# Patient Record
Sex: Male | Born: 1960
Health system: Southern US, Community
[De-identification: ages and names within clinical notes are randomized; demographics above are authoritative.]

## PROBLEM LIST (undated history)

## (undated) DIAGNOSIS — F419 Anxiety disorder, unspecified: Secondary | ICD-10-CM

## (undated) DIAGNOSIS — M1990 Unspecified osteoarthritis, unspecified site: Secondary | ICD-10-CM

## (undated) DIAGNOSIS — F32A Depression, unspecified: Secondary | ICD-10-CM

## (undated) DIAGNOSIS — E785 Hyperlipidemia, unspecified: Secondary | ICD-10-CM

## (undated) DIAGNOSIS — E119 Type 2 diabetes mellitus without complications: Secondary | ICD-10-CM

## (undated) DIAGNOSIS — G43909 Migraine, unspecified, not intractable, without status migrainosus: Secondary | ICD-10-CM

## (undated) DIAGNOSIS — I10 Essential (primary) hypertension: Secondary | ICD-10-CM

## (undated) HISTORY — PX: NO PAST SURGERIES: SHX2092

## (undated) HISTORY — DX: Type 2 diabetes mellitus without complications: E11.9

## (undated) HISTORY — DX: Hyperlipidemia, unspecified: E78.5

## (undated) HISTORY — DX: Migraine, unspecified, not intractable, without status migrainosus: G43.909

---

## 2000-06-12 ENCOUNTER — Emergency Department (HOSPITAL_COMMUNITY): Admission: EM | Admit: 2000-06-12 | Discharge: 2000-06-12 | Payer: Self-pay | Admitting: Emergency Medicine

## 2000-09-27 ENCOUNTER — Emergency Department (HOSPITAL_COMMUNITY): Admission: EM | Admit: 2000-09-27 | Discharge: 2000-09-27 | Payer: Self-pay

## 2001-12-28 ENCOUNTER — Emergency Department (HOSPITAL_COMMUNITY): Admission: EM | Admit: 2001-12-28 | Discharge: 2001-12-28 | Payer: Self-pay | Admitting: Emergency Medicine

## 2001-12-28 ENCOUNTER — Encounter: Payer: Self-pay | Admitting: Emergency Medicine

## 2003-04-25 ENCOUNTER — Emergency Department (HOSPITAL_COMMUNITY): Admission: AD | Admit: 2003-04-25 | Discharge: 2003-04-25 | Payer: Self-pay | Admitting: Family Medicine

## 2003-08-02 ENCOUNTER — Emergency Department (HOSPITAL_COMMUNITY): Admission: EM | Admit: 2003-08-02 | Discharge: 2003-08-02 | Payer: Self-pay | Admitting: Family Medicine

## 2003-10-19 ENCOUNTER — Emergency Department (HOSPITAL_COMMUNITY): Admission: EM | Admit: 2003-10-19 | Discharge: 2003-10-19 | Payer: Self-pay | Admitting: Emergency Medicine

## 2006-03-16 ENCOUNTER — Emergency Department (HOSPITAL_COMMUNITY): Admission: EM | Admit: 2006-03-16 | Discharge: 2006-03-16 | Payer: Self-pay | Admitting: Emergency Medicine

## 2006-08-18 ENCOUNTER — Emergency Department (HOSPITAL_COMMUNITY): Admission: EM | Admit: 2006-08-18 | Discharge: 2006-08-18 | Payer: Self-pay | Admitting: Emergency Medicine

## 2007-01-19 ENCOUNTER — Ambulatory Visit: Payer: Self-pay | Admitting: Nurse Practitioner

## 2007-01-19 DIAGNOSIS — G43909 Migraine, unspecified, not intractable, without status migrainosus: Secondary | ICD-10-CM | POA: Insufficient documentation

## 2007-01-19 DIAGNOSIS — Z8679 Personal history of other diseases of the circulatory system: Secondary | ICD-10-CM | POA: Insufficient documentation

## 2007-01-20 ENCOUNTER — Encounter (INDEPENDENT_AMBULATORY_CARE_PROVIDER_SITE_OTHER): Payer: Self-pay | Admitting: Nurse Practitioner

## 2007-01-20 ENCOUNTER — Ambulatory Visit: Payer: Self-pay | Admitting: *Deleted

## 2007-02-15 ENCOUNTER — Telehealth (INDEPENDENT_AMBULATORY_CARE_PROVIDER_SITE_OTHER): Payer: Self-pay | Admitting: Nurse Practitioner

## 2007-04-25 ENCOUNTER — Encounter (INDEPENDENT_AMBULATORY_CARE_PROVIDER_SITE_OTHER): Payer: Self-pay | Admitting: Nurse Practitioner

## 2007-04-25 ENCOUNTER — Telehealth (INDEPENDENT_AMBULATORY_CARE_PROVIDER_SITE_OTHER): Payer: Self-pay | Admitting: Nurse Practitioner

## 2007-08-04 ENCOUNTER — Ambulatory Visit: Payer: Self-pay | Admitting: Nurse Practitioner

## 2007-08-04 DIAGNOSIS — K648 Other hemorrhoids: Secondary | ICD-10-CM | POA: Insufficient documentation

## 2007-08-04 LAB — CONVERTED CEMR LAB
ALT: 23 units/L (ref 0–53)
AST: 19 units/L (ref 0–37)
Albumin: 4.5 g/dL (ref 3.5–5.2)
Alkaline Phosphatase: 67 units/L (ref 39–117)
BUN: 19 mg/dL (ref 6–23)
Basophils Absolute: 0 10*3/uL (ref 0.0–0.1)
Basophils Relative: 0 % (ref 0–1)
Bilirubin Urine: NEGATIVE
Blood in Urine, dipstick: NEGATIVE
CO2: 25 meq/L (ref 19–32)
Calcium: 8.9 mg/dL (ref 8.4–10.5)
Chlamydia, Swab/Urine, PCR: NEGATIVE
Chloride: 106 meq/L (ref 96–112)
Creatinine, Ser: 1.28 mg/dL (ref 0.40–1.50)
Eosinophils Absolute: 0.2 10*3/uL (ref 0.0–0.7)
Eosinophils Relative: 2 % (ref 0–5)
GC Probe Amp, Urine: NEGATIVE
Glucose, Bld: 77 mg/dL (ref 70–99)
Glucose, Urine, Semiquant: NEGATIVE
HCT: 45.2 % (ref 39.0–52.0)
Hemoglobin: 15.9 g/dL (ref 13.0–17.0)
Lymphocytes Relative: 31 % (ref 12–46)
Lymphs Abs: 3.2 10*3/uL (ref 0.7–4.0)
MCHC: 35.2 g/dL (ref 30.0–36.0)
MCV: 98.9 fL (ref 78.0–100.0)
Monocytes Absolute: 0.8 10*3/uL (ref 0.1–1.0)
Monocytes Relative: 8 % (ref 3–12)
Neutro Abs: 6.3 10*3/uL (ref 1.7–7.7)
Neutrophils Relative %: 60 % (ref 43–77)
Nitrite: NEGATIVE
PSA: 2.11 ng/mL (ref 0.10–4.00)
Platelets: 254 10*3/uL (ref 150–400)
Potassium: 4.4 meq/L (ref 3.5–5.3)
Protein, U semiquant: 30
RBC: 4.57 M/uL (ref 4.22–5.81)
RDW: 12.9 % (ref 11.5–15.5)
Sodium: 141 meq/L (ref 135–145)
Specific Gravity, Urine: >=1.03
TSH: 1.14 microintl units/mL (ref 0.350–5.50)
Total Bilirubin: 0.5 mg/dL (ref 0.3–1.2)
Total Protein: 7.5 g/dL (ref 6.0–8.3)
Urobilinogen, UA: 1
WBC Urine, dipstick: NEGATIVE
WBC: 10.4 10*3/uL (ref 4.0–10.5)
pH: 6

## 2007-08-05 ENCOUNTER — Encounter (INDEPENDENT_AMBULATORY_CARE_PROVIDER_SITE_OTHER): Payer: Self-pay | Admitting: Nurse Practitioner

## 2007-08-08 ENCOUNTER — Encounter (INDEPENDENT_AMBULATORY_CARE_PROVIDER_SITE_OTHER): Payer: Self-pay | Admitting: Nurse Practitioner

## 2008-04-13 ENCOUNTER — Ambulatory Visit: Payer: Self-pay | Admitting: Nurse Practitioner

## 2008-04-13 DIAGNOSIS — M25519 Pain in unspecified shoulder: Secondary | ICD-10-CM | POA: Insufficient documentation

## 2008-04-13 DIAGNOSIS — M25569 Pain in unspecified knee: Secondary | ICD-10-CM | POA: Insufficient documentation

## 2008-04-13 LAB — CONVERTED CEMR LAB
ALT: 25 units/L (ref 0–53)
Alkaline Phosphatase: 64 units/L (ref 39–117)
Rhuematoid fact SerPl-aCnc: <20 intl units/mL (ref 0–20)
Sed Rate: 14 mm/hr (ref 0–16)
Sodium: 143 meq/L (ref 135–145)
Total Bilirubin: 0.4 mg/dL (ref 0.3–1.2)
Total Protein: 7.3 g/dL (ref 6.0–8.3)

## 2008-04-17 ENCOUNTER — Ambulatory Visit (HOSPITAL_COMMUNITY): Admission: RE | Admit: 2008-04-17 | Discharge: 2008-04-17 | Payer: Self-pay | Admitting: Nurse Practitioner

## 2008-04-17 ENCOUNTER — Encounter (INDEPENDENT_AMBULATORY_CARE_PROVIDER_SITE_OTHER): Payer: Self-pay | Admitting: Nurse Practitioner

## 2008-10-08 ENCOUNTER — Ambulatory Visit: Payer: Self-pay | Admitting: Nurse Practitioner

## 2008-10-08 LAB — CONVERTED CEMR LAB
AST: 18 units/L (ref 0–37)
Alkaline Phosphatase: 66 units/L (ref 39–117)
BUN: 17 mg/dL (ref 6–23)
Basophils Absolute: 0 10*3/uL (ref 0.0–0.1)
Basophils Relative: 0 % (ref 0–1)
Bilirubin Urine: NEGATIVE
Creatinine, Ser: 1.08 mg/dL (ref 0.40–1.50)
Eosinophils Absolute: 0.2 10*3/uL (ref 0.0–0.7)
Eosinophils Relative: 3 % (ref 0–5)
GC Probe Amp, Urine: NEGATIVE
Glucose, Urine, Semiquant: NEGATIVE
HCT: 40.7 % (ref 39.0–52.0)
HDL goal, serum: 40 mg/dL
HDL: 44 mg/dL (ref >39)
Ketones, urine, test strip: NEGATIVE
LDL Cholesterol: 54 mg/dL (ref 0–99)
LDL Goal: 130 mg/dL
Lymphocytes Relative: 30 % (ref 12–46)
MCHC: 34.6 g/dL (ref 30.0–36.0)
Microalb, Ur: 1.62 mg/dL (ref 0.00–1.89)
Platelets: 238 10*3/uL (ref 150–400)
RDW: 12.9 % (ref 11.5–15.5)
Specific Gravity, Urine: >=1.03
TSH: 2.729 microintl units/mL (ref 0.350–4.500)
Total Bilirubin: 0.8 mg/dL (ref 0.3–1.2)
Total CHOL/HDL Ratio: 2.8
Triglycerides: 123 mg/dL (ref <150)

## 2008-10-10 ENCOUNTER — Encounter (INDEPENDENT_AMBULATORY_CARE_PROVIDER_SITE_OTHER): Payer: Self-pay | Admitting: Nurse Practitioner

## 2009-07-11 ENCOUNTER — Emergency Department (HOSPITAL_COMMUNITY): Admission: EM | Admit: 2009-07-11 | Discharge: 2009-07-11 | Payer: Self-pay | Admitting: Emergency Medicine

## 2010-03-09 LAB — CONVERTED CEMR LAB
ALT: 26 units/L (ref 0–53)
AST: 15 units/L (ref 0–37)
Albumin: 4.1 g/dL (ref 3.5–5.2)
Alkaline Phosphatase: 67 units/L (ref 39–117)
BUN: 14 mg/dL (ref 6–23)
CO2: 23 meq/L (ref 19–32)
Calcium: 8.9 mg/dL (ref 8.4–10.5)
Chloride: 109 meq/L (ref 96–112)
Creatinine, Ser: 1 mg/dL (ref 0.40–1.50)
Glucose, Bld: 102 mg/dL — ABNORMAL HIGH (ref 70–99)
Potassium: 3.7 meq/L (ref 3.5–5.3)
Sodium: 143 meq/L (ref 135–145)
Total Bilirubin: 0.4 mg/dL (ref 0.3–1.2)
Total Protein: 6.9 g/dL (ref 6.0–8.3)

## 2010-11-07 ENCOUNTER — Inpatient Hospital Stay (INDEPENDENT_AMBULATORY_CARE_PROVIDER_SITE_OTHER)
Admission: RE | Admit: 2010-11-07 | Discharge: 2010-11-07 | Disposition: A | Discharge status: Home / Self Care | Payer: Self-pay | Source: Ambulatory Visit | Attending: Family Medicine | Admitting: Family Medicine

## 2010-11-07 DIAGNOSIS — M542 Cervicalgia: Secondary | ICD-10-CM

## 2011-09-09 ENCOUNTER — Emergency Department (INDEPENDENT_AMBULATORY_CARE_PROVIDER_SITE_OTHER)
Admission: EM | Admit: 2011-09-09 | Discharge: 2011-09-09 | Disposition: A | Discharge status: Home / Self Care | Payer: Self-pay | Source: Home / Self Care | Attending: Family Medicine | Admitting: Family Medicine

## 2011-09-09 ENCOUNTER — Encounter (HOSPITAL_COMMUNITY): Payer: Self-pay | Admitting: *Deleted

## 2011-09-09 DIAGNOSIS — G43909 Migraine, unspecified, not intractable, without status migrainosus: Secondary | ICD-10-CM

## 2011-09-09 HISTORY — DX: Essential (primary) hypertension: I10

## 2011-09-09 MED ORDER — SUMATRIPTAN SUCCINATE 50 MG PO TABS: ORAL_TABLET | ORAL | Status: DC

## 2011-09-09 MED ORDER — METOCLOPRAMIDE HCL 10 MG PO TABS: ORAL_TABLET | ORAL | Status: DC

## 2011-09-09 MED ORDER — PREDNISONE 20 MG PO TABS: ORAL_TABLET | ORAL | Status: AC

## 2011-09-09 MED ORDER — TRAMADOL HCL 50 MG PO TABS: 50.0000 mg | ORAL_TABLET | Freq: Four times a day (QID) | ORAL | Status: AC | PRN

## 2011-09-09 NOTE — ED Notes (Signed)
Pt  Reports  Symptoms      Of  Headaches     X  sev  Weeks      -  He  States  He  Was  Told  In  Past that  His  bp     Was  High     -  He  Takes  No bp meds  At  This  Time         -  He  Is  Awake  And  Alert       Speaking        In  Complete  sentances              Skin is  Warm  And  Dry          He  Reports  The  Headache  Not  releived  By otc    Tylenol

## 2011-09-09 NOTE — ED Provider Notes (Signed)
History     CSN: [id number]  Arrival date & time 09/09/11  1136   First MD Initiated Contact with Patient 09/09/11 1206      Chief Complaint  Patient presents with  . Headache    (Consider location/radiation/quality/duration/timing/severity/associated sxs/prior treatment) HPI Comments: 51 year old male with history of hypertension and  migraine headaches. Comes complaining of intermittent headaches almost daily during the last 2 weeks. Reports that he has been diagnosed with migraines in the past in [place] state where he had an EEG as part of his workup. At the time he had "a migraine prescription that helps" with his pain (he does not remember the name). Reports he has episode of migraine about 3 times a year lasting for about a week at a time.  Headaches are bitemporal initially then generalized; currently last about 3 hours daily mostly in the evenings and he can predict when they headache is gone a start by having eye fatigue and blurred vision that self resolves after headache improves. Symptoms associated with nausea and photophobia. Denies extremity weakness numbness or paresthesias.       Past Medical History  Diagnosis Date  . Hypertension     History reviewed. No pertinent past surgical history.  History reviewed. No pertinent family history.  History  Substance Use Topics  . Smoking status: Never Smoker   . Smokeless tobacco: Not on file  . Alcohol Use: Yes      Review of Systems  Constitutional: Negative for fever and chills.  Eyes: Positive for photophobia and visual disturbance. Negative for pain and redness.  Respiratory: Negative for cough and shortness of breath.   Cardiovascular: Negative for chest pain and leg swelling.  Gastrointestinal: Positive for nausea. Negative for vomiting and diarrhea.  Genitourinary: Negative for dysuria and hematuria.  Skin: Negative for rash.  Neurological: Positive for headaches. Negative for tremors, seizures, syncope,  speech difficulty, weakness and numbness.    Allergies  Review of patient's allergies indicates no known allergies.  Home Medications   Current Outpatient Rx  Name Route Sig Dispense Refill  . ACETAMINOPHEN 500 MG PO TABS Oral Take 500 mg by mouth every 6 (six) hours as needed.    Marland Kitchen METOCLOPRAMIDE HCL 10 MG PO TABS  1 tablet by mouth x1 for migraine pain, nausea or vomiting. 15 tablet 0  . PREDNISONE 20 MG PO TABS  2 tablets by mouth daily for 5 days 10 tablet 0  . SUMATRIPTAN SUCCINATE 50 MG PO TABS  1 tablet by mouth x1 for migraine headache can repeat in 2 hours 10 tablet 0  . TRAMADOL HCL 50 MG PO TABS Oral Take 1 tablet (50 mg total) by mouth every 6 (six) hours as needed for pain. 15 tablet 0    BP 153/85  Pulse 70  Temp 98.3 F (36.8 C) (Oral)  Resp 16  SpO2 99%  Physical Exam  Nursing note and vitals reviewed. Constitutional: He is oriented to person, place, and time. He appears well-developed and well-nourished. No distress.  HENT:  Head: Normocephalic and atraumatic.  Right Ear: External ear normal.  Left Ear: External ear normal.  Nose: Nose normal.  Mouth/Throat: Oropharynx is clear and moist. No oropharyngeal exudate.  Eyes: Conjunctivae and EOM are normal. Pupils are equal, round, and reactive to light. Right eye exhibits no discharge. Left eye exhibits no discharge. No scleral icterus.  Neck: Normal range of motion. Neck supple. No JVD present. No thyromegaly present.  Cardiovascular: Normal rate, regular rhythm and normal  heart sounds.   Pulmonary/Chest: Effort normal and breath sounds normal.  Lymphadenopathy:    He has no cervical adenopathy.  Neurological: He is alert and oriented to person, place, and time. He has normal strength and normal reflexes. No cranial nerve deficit or sensory deficit. He displays a negative Romberg sign. Coordination and gait normal.       Normal visual fields compared with mine.   Skin: He is not diaphoretic.    ED Course    Procedures (including critical care time)  Labs Reviewed - No data to display No results found.   1. Migraines       MDM  Classic symptoms of migraine headaches. Also h/o borderline hypertension. Patient not taking any medications for blood pressure. Prescribed Imitrex, tramadol, prednisone and Reglan. Recommended to avoid over-the-counter pain medications to avoid rebound headaches. Asked to have his blood pressure rechecked once he's pain resolves. Return to medical attention if worsening persistent or new symptoms.         Sharin Grave, MD 09/12/11 [id number]

## 2013-05-22 ENCOUNTER — Emergency Department (HOSPITAL_COMMUNITY): Payer: Self-pay

## 2013-05-22 ENCOUNTER — Encounter (HOSPITAL_COMMUNITY): Payer: Self-pay | Admitting: Emergency Medicine

## 2013-05-22 ENCOUNTER — Emergency Department (HOSPITAL_COMMUNITY)
Admission: EM | Admit: 2013-05-22 | Discharge: 2013-05-22 | Disposition: A | Discharge status: Home / Self Care | Payer: Self-pay | Attending: Emergency Medicine | Admitting: Emergency Medicine

## 2013-05-22 DIAGNOSIS — M25569 Pain in unspecified knee: Secondary | ICD-10-CM | POA: Insufficient documentation

## 2013-05-22 DIAGNOSIS — G8929 Other chronic pain: Secondary | ICD-10-CM | POA: Insufficient documentation

## 2013-05-22 DIAGNOSIS — I10 Essential (primary) hypertension: Secondary | ICD-10-CM | POA: Insufficient documentation

## 2013-05-22 DIAGNOSIS — R52 Pain, unspecified: Secondary | ICD-10-CM | POA: Insufficient documentation

## 2013-05-22 DIAGNOSIS — M1990 Unspecified osteoarthritis, unspecified site: Secondary | ICD-10-CM | POA: Insufficient documentation

## 2013-05-22 DIAGNOSIS — M25561 Pain in right knee: Secondary | ICD-10-CM

## 2013-05-22 MED ORDER — IBUPROFEN 800 MG PO TABS: 800.0000 mg | ORAL_TABLET | Freq: Three times a day (TID) | ORAL | Status: DC

## 2013-05-22 MED ORDER — HYDROCODONE-ACETAMINOPHEN 5-325 MG PO TABS
2.0000 | ORAL_TABLET | Freq: Once | ORAL | Status: AC
  Administered 2013-05-22: 2 via ORAL
  Filled 2013-05-22: qty 2

## 2013-05-22 MED ORDER — HYDROCODONE-ACETAMINOPHEN 5-325 MG PO TABS: 1.0000 | ORAL_TABLET | ORAL | Status: DC | PRN

## 2013-05-22 NOTE — Discharge Instructions (Signed)
Take vicodin as prescribed for severe pain.   Do not drive within four hours of taking this medication (may cause drowsiness or confusion).  Take up to 800mg  of ibuprofen three times a day for the next 3-4 days (take with food).  Ice 3 times a day for 15-20 minutes.  Elevate when possible to decrease swelling and pain.  Activity as tolerated.  Follow up with your orthopedist or the one that you have been referred to.  You may return to the ER if your pain worsens or you have any other concerns.

## 2013-05-22 NOTE — ED Notes (Signed)
Pt in c/o chronic bilateral knee pain, states he has arthritis and has intermittent problems, pain returned over the weekend, worse to right knee with swelling

## 2013-05-22 NOTE — ED Provider Notes (Signed)
CSN: [id number]     Arrival date & time 05/22/13  1524 History  This chart was scribed for non-physician practitioner, Mackie PaiKatie SchineIver, PA-C working with Glynn OctaveStephen Rancour, MD by Luisa DagoPriscilla Tutu, ED scribe. This patient was seen in room TR10C/TR10C and the patient's care was started at 5:55 PM.    Chief Complaint  Patient presents with  . Knee Pain      The history is provided by the patient. No language interpreter was used.   HPI Comments: James Robinson is a 53 y.o. male with a history of bilateral knee pain presents to the Emergency Department complaining of worsening right knee pain. Pt was diagnosed with degenerative arthritis in 2009 However, this weekend the pain began to gradually worsened in his right knee. Pt is complaining of associated swelling and sleep disturbances secondary to the pain. He reports taking OTC medication and prescribed arthritis medication with no relief. 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 However, he denies any recent injury or over working the joint.  Pt states that he was down with a cold about 1 month ago. Currently he denies any fever, chills, nausea, emesis, numbness, weakness, paraesthesia, chest pain, or SOB. Denies any history of DM.   Past Medical History  Diagnosis Date  . Hypertension    History reviewed. No pertinent past surgical history. History reviewed. No pertinent family history. History  Substance Use Topics  . Smoking status: Never Smoker   . Smokeless tobacco: Not on file  . Alcohol Use: Yes    Review of Systems  Musculoskeletal: Positive for arthralgias (bilateral knee pain).  All other systems reviewed and are negative.     Allergies  Review of patient's allergies indicates no known allergies.  Home Medications   Current Outpatient Rx  Name  Route  Sig  Dispense  Refill  . acetaminophen (TYLENOL) 500 MG tablet   Oral   Take 500 mg by mouth every 6 (six) hours as needed.         . metoCLOPramide (REGLAN) 10  MG tablet      1 tablet by mouth x1 for migraine pain, nausea or vomiting.   15 tablet   0   . SUMAtriptan (IMITREX) 50 MG tablet      1 tablet by mouth x1 for migraine headache can repeat in 2 hours   10 tablet   0    BP 142/72  Pulse 82  Temp(Src) 98.5 F (36.9 C) (Oral)  Resp 20  Wt 185 lb (83.915 kg)  SpO2 100%  Physical Exam  Nursing note and vitals reviewed. Constitutional: He is oriented to person, place, and time. He appears well-developed and well-nourished. No distress.  HENT:  Head: Normocephalic and atraumatic.  Eyes:  Normal appearance  Neck: Normal range of motion.  Pulmonary/Chest: Effort normal.  Musculoskeletal: Normal range of motion.  Right knee edematous compared to L.  No erythema or warmth.  Diffuse ttp.  Pain w/ flexion beyond ~60deg as well as with extreme extension.  No pain in knee w/ passive ROM of ankle.  2+ DP pulse and distal sensation intact.    Neurological: He is alert and oriented to person, place, and time.  Psychiatric: He has a normal mood and affect. His behavior is normal.    ED Course  Procedures (including critical care time)  DIAGNOSTIC STUDIES: Oxygen Saturation is 100% on RA, normal by my interpretation.    COORDINATION OF CARE: 6:00 PM- Will order X-Ray of right knee.  Will also order pain medication. Pt advised of plan for treatment and pt agrees. 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 Degenerative changes of the knee joint are seen most marked medially. No acute fracture or dislocation is seen. No soft tissue abnormality is noted.  IMPRESSION: No acute abnormality seen.    Electronically Signed   By: Alcide CleverMark  Lukens M.D.   On: 05/22/2013 19:08     EKG Interpretation None      MDM   Final diagnoses:  Pain in right knee   Immunocompetent 53yo M presents w/ acute on chronic, non-traumatic R knee pain.  Significant edema compared to L but no fever, no erythema/warmth, full but painful ROM, non-toxic appearance.  Will xray to assess for joint effusion.  2 vicodin ordered for pain.  6:05 PM   Xray sig for degenerative changes, particularly at medial aspect of joint.  This is where pain is most significant.  Ortho tech provided w/ knee immobilizer, I prescribed short course of vicodin and 800mg  ibuprofen and recommended ice/elevation/rest.  Referred to ortho for further management.  Return precautions, including erythema/warmth, increased edema and associated fever discussed.  I personally performed the services described in this documentation, which was scribed in my presence. The recorded information has been reviewed and is accurate.    Arie SabinaCatherine E Shon Mansouri, PA-C 05/23/13 1052

## 2013-05-22 NOTE — ED Notes (Signed)
Patient transported to X-ray 

## 2013-05-23 NOTE — ED Provider Notes (Signed)
Medical screening examination/treatment/procedure(s) were performed by non-physician practitioner and as supervising physician I was immediately available for consultation/collaboration.   EKG Interpretation None       Raza Bayless, MD 05/23/13 1056 

## 2013-06-17 ENCOUNTER — Encounter (HOSPITAL_COMMUNITY): Payer: Self-pay | Admitting: Emergency Medicine

## 2013-06-17 ENCOUNTER — Emergency Department (HOSPITAL_COMMUNITY)
Admission: EM | Admit: 2013-06-17 | Discharge: 2013-06-17 | Disposition: A | Discharge status: Home / Self Care | Payer: Self-pay | Attending: Emergency Medicine | Admitting: Emergency Medicine

## 2013-06-17 DIAGNOSIS — K047 Periapical abscess without sinus: Secondary | ICD-10-CM | POA: Insufficient documentation

## 2013-06-17 DIAGNOSIS — R252 Cramp and spasm: Secondary | ICD-10-CM | POA: Insufficient documentation

## 2013-06-17 DIAGNOSIS — R6883 Chills (without fever): Secondary | ICD-10-CM | POA: Insufficient documentation

## 2013-06-17 DIAGNOSIS — IMO0001 Reserved for inherently not codable concepts without codable children: Secondary | ICD-10-CM | POA: Insufficient documentation

## 2013-06-17 DIAGNOSIS — Z791 Long term (current) use of non-steroidal anti-inflammatories (NSAID): Secondary | ICD-10-CM | POA: Insufficient documentation

## 2013-06-17 DIAGNOSIS — I10 Essential (primary) hypertension: Secondary | ICD-10-CM | POA: Insufficient documentation

## 2013-06-17 MED ORDER — CLINDAMYCIN HCL 150 MG PO CAPS
300.0000 mg | ORAL_CAPSULE | Freq: Once | ORAL | Status: AC
  Administered 2013-06-17: 300 mg via ORAL
  Filled 2013-06-17: qty 2

## 2013-06-17 MED ORDER — CLINDAMYCIN HCL 150 MG PO CAPS: 150.0000 mg | ORAL_CAPSULE | Freq: Four times a day (QID) | ORAL | Status: DC

## 2013-06-17 MED ORDER — HYDROCODONE-ACETAMINOPHEN 5-325 MG PO TABS: 1.0000 | ORAL_TABLET | Freq: Four times a day (QID) | ORAL | Status: DC | PRN

## 2013-06-17 MED ORDER — OXYCODONE-ACETAMINOPHEN 5-325 MG PO TABS
1.0000 | ORAL_TABLET | Freq: Once | ORAL | Status: AC
  Administered 2013-06-17: 1 via ORAL
  Filled 2013-06-17: qty 1

## 2013-06-17 NOTE — ED Provider Notes (Signed)
Medical screening examination/treatment/procedure(s) were performed by non-physician practitioner and as supervising physician I was immediately available for consultation/collaboration.   EKG Interpretation None        William Alishea Beaudin, MD 06/17/13 2259 

## 2013-06-17 NOTE — Discharge Instructions (Signed)
Abscessed Tooth  An abscessed tooth is an infection around your tooth. It may be caused by holes or damage to the tooth (cavity) or a dental disease. An abscessed tooth causes mild to very bad pain in and around the tooth. See your dentist right away if you have tooth or gum pain.  HOME CARE   Take your medicine as told. Finish it even if you start to feel better.   Do not drive after taking pain medicine.   Rinse your mouth (gargle) often with salt water ( teaspoon salt in 8 ounces of warm water).   Do not apply heat to the outside of your face.  GET HELP RIGHT AWAY IF:    You have a temperature by mouth above 102 F (38.9 C), not controlled by medicine.   You have chills and a very bad headache.   You have problems breathing or swallowing.   Your mouth will not open.   You develop puffiness (swelling) on the neck or around the eye.   Your pain is not helped by medicine.   Your pain is getting worse instead of better.  MAKE SURE YOU:    Understand these instructions.   Will watch your condition.   Will get help right away if you are not doing well or get worse.  Document Released: 07/15/2007 Document Revised: 04/20/2011 Document Reviewed: 05/06/2010  [employer] Patient Information 2014 [employer]

## 2013-06-17 NOTE — ED Notes (Signed)
Patient went to get a partial and they noticed a loss tooth on the bottom left side.  Left side of his mouth is swollen, no difficulty swelling.  Also c/o his hands cramping.  Works Licensed conveyancerwaiting tables.

## 2013-06-17 NOTE — ED Provider Notes (Signed)
CSN: [id number]     Arrival date & time 06/17/13  2023 History  This chart was scribed for non-physician practitioner, Fayrene HelperBowie Asiah Browder, PA-C working with Dagmar HaitWilliam Blair Walden, MD by Greggory StallionKayla Andersen, ED scribe. This patient was seen in room TR08C/TR08C and the patient's care was started at 9:59 PM.   Chief Complaint  Patient presents with  . Oral Swelling  . Hands Cramping    The history is provided by the patient. No language interpreter was used.   HPI Comments: James Robinson is a 53 y.o. male who presents to the Emergency Department complaining of gradual onset, sharp, aching left lower dental pain and worsening swelling that started 6 days ago. States he is also having some chills. Eating worsens the pain. Pt was given Vicodin and 800 mg ibuprofen for knee swelling and pain a few weeks ago and has been taking it for his dental pain with some relief. He went to get impressions done for a partial with Dr. Bruna Pottereague about 5 days ago but states his symptoms weren't that bad. Pt is also complaining of intermittent bilateral hand cramping that started about one year ago. He states he stretches his hand muscles when it cramps with some relief. Denies fever.   Past Medical History  Diagnosis Date  . Hypertension    History reviewed. No pertinent past surgical history. History reviewed. No pertinent family history. History  Substance Use Topics  . Smoking status: Never Smoker   . Smokeless tobacco: Not on file  . Alcohol Use: Yes    Review of Systems  Constitutional: Positive for chills. Negative for fever.  HENT: Positive for dental problem and facial swelling.   Musculoskeletal: Positive for myalgias.  All other systems reviewed and are negative.  Allergies  Review of patient's allergies indicates no known allergies.  Home Medications   Prior to Admission medications   Medication Sig Start Date End Date Taking? Authorizing Provider  acetaminophen (TYLENOL) 500 MG tablet Take 500 mg by mouth  every 6 (six) hours as needed.    Historical Provider, MD  HYDROcodone-acetaminophen (NORCO/VICODIN) 5-325 MG per tablet Take 1 tablet by mouth every 4 (four) hours as needed for moderate pain. 05/22/13   Arie Sabinaatherine E Schinlever, PA-C  ibuprofen (ADVIL,MOTRIN) 200 MG tablet Take 200 mg by mouth every 6 (six) hours as needed for moderate pain.    Historical Provider, MD  ibuprofen (ADVIL,MOTRIN) 800 MG tablet Take 1 tablet (800 mg total) by mouth 3 (three) times daily. 05/22/13   Arie Sabinaatherine E Schinlever, PA-C   BP 142/77  Pulse 101  Temp(Src) 99 F (37.2 C) (Oral)  Resp 20  Ht 5\' 11"  (1.803 m)  Wt 185 lb (83.915 kg)  BMI 25.81 kg/m2  SpO2 99%  Physical Exam  Nursing note and vitals reviewed. Constitutional: He is oriented to person, place, and time. He appears well-developed and well-nourished. No distress.  HENT:  Head: Normocephalic and atraumatic.  Left lower gumline with obvious abscess. Abscess measuring 3 cm. Tender to palpation with fluctuance. Facial involvement but no airway compromise. No trismus.  Eyes: EOM are normal.  Neck: Neck supple. No tracheal deviation present.  Cardiovascular: Normal rate.   Pulmonary/Chest: Effort normal. No respiratory distress.  Musculoskeletal: Normal range of motion.  Lymphadenopathy:    He has cervical adenopathy.  Neurological: He is alert and oriented to person, place, and time.  Skin: Skin is warm and dry.  Psychiatric: He has a normal mood and affect. His behavior is normal.  ED Course  Procedures (including critical care time)  DIAGNOSTIC STUDIES: Oxygen Saturation is 99% on RA, normal by my interpretation.    COORDINATION OF CARE: 10:04 PM-Discussed treatment plan which includes I&D, updating tetanus and an antibiotic with pt at bedside and pt agreed to plan. Advised pt to follow up with his dentist.   INCISION AND DRAINAGE Performed by: Fayrene HelperBowie Lakysha Kossman, PA-C Consent: Verbal consent obtained. Risks and benefits: risks, benefits and  alternatives were discussed Type: abscess  Body area: left lower gumline  Anesthesia: local infiltration  Incision was made with a scalpel.  Local anesthetic: lidocaine 2% without epinephrine  Anesthetic total: 5 ml  Complexity: complex  Drainage: purulent  Drainage amount: moderate  Packing material: none  Patient tolerance: Patient tolerated the procedure well with no immediate complications.   Labs Review Labs Reviewed - No data to display  Imaging Review No results found.   EKG Interpretation None      MDM   Final diagnoses:  Periapical abscess with facial involvement    BP 142/77  Pulse 101  Temp(Src) 99 F (37.2 C) (Oral)  Resp 20  Ht 5\' 11"  (1.803 m)  Wt 185 lb (83.915 kg)  BMI 25.81 kg/m2  SpO2 99%   I personally performed the services described in this documentation, which was scribed in my presence. The recorded information has been reviewed and is accurate.  Fayrene HelperBowie Demontrae Gilbert, PA-C 06/17/13 2252

## 2013-06-17 NOTE — ED Notes (Signed)
Onset Monday pt went to dentist to have partial fitted.  Dentist noticed that the tooth that needs to be pulled was abscessed.  Dentist would not pull the abscess tooth at that time.  Later Monday left side of face started swelling and increase in pain.  Pain worsening.

## 2013-12-21 ENCOUNTER — Emergency Department (HOSPITAL_COMMUNITY)
Admission: EM | Admit: 2013-12-21 | Discharge: 2013-12-21 | Disposition: A | Discharge status: Home / Self Care | Payer: Worker's Compensation | Attending: Emergency Medicine | Admitting: Emergency Medicine

## 2013-12-21 ENCOUNTER — Emergency Department (HOSPITAL_COMMUNITY): Payer: Worker's Compensation

## 2013-12-21 ENCOUNTER — Encounter (HOSPITAL_COMMUNITY): Payer: Self-pay | Admitting: Family Medicine

## 2013-12-21 DIAGNOSIS — Y9389 Activity, other specified: Secondary | ICD-10-CM | POA: Insufficient documentation

## 2013-12-21 DIAGNOSIS — S8991XA Unspecified injury of right lower leg, initial encounter: Secondary | ICD-10-CM | POA: Insufficient documentation

## 2013-12-21 DIAGNOSIS — Y990 Civilian activity done for income or pay: Secondary | ICD-10-CM | POA: Insufficient documentation

## 2013-12-21 DIAGNOSIS — W1849XA Other slipping, tripping and stumbling without falling, initial encounter: Secondary | ICD-10-CM | POA: Insufficient documentation

## 2013-12-21 DIAGNOSIS — Y9289 Other specified places as the place of occurrence of the external cause: Secondary | ICD-10-CM | POA: Insufficient documentation

## 2013-12-21 DIAGNOSIS — I10 Essential (primary) hypertension: Secondary | ICD-10-CM | POA: Insufficient documentation

## 2013-12-21 DIAGNOSIS — M25561 Pain in right knee: Secondary | ICD-10-CM

## 2013-12-21 DIAGNOSIS — M25461 Effusion, right knee: Secondary | ICD-10-CM

## 2013-12-21 DIAGNOSIS — Z791 Long term (current) use of non-steroidal anti-inflammatories (NSAID): Secondary | ICD-10-CM | POA: Insufficient documentation

## 2013-12-21 MED ORDER — HYDROCODONE-ACETAMINOPHEN 5-325 MG PO TABS
2.0000 | ORAL_TABLET | Freq: Once | ORAL | Status: AC
Start: 2013-12-21 — End: 2013-12-21
  Administered 2013-12-21: 2 via ORAL
  Filled 2013-12-21: qty 2

## 2013-12-21 MED ORDER — MELOXICAM 7.5 MG PO TABS: 7.5000 mg | ORAL_TABLET | Freq: Every day | ORAL | Status: DC

## 2013-12-21 MED ORDER — TRAMADOL HCL 50 MG PO TABS: 50.0000 mg | ORAL_TABLET | Freq: Four times a day (QID) | ORAL | Status: DC | PRN

## 2013-12-21 NOTE — ED Notes (Signed)
Ortho paged. 

## 2013-12-21 NOTE — ED Notes (Signed)
Pt comfortable with discharge and follow up instructions. Pt declines wheelchair, escorted to waiting area by this RN. Prescriptions x2. 

## 2013-12-21 NOTE — ED Provider Notes (Signed)
CSN: [id number]     Arrival date & time 12/21/13  [id number] History  This chart is scribed for non-physician practitioner, Junius FinnerErin O'Malley, PA-C, working with Flint MelterElliott L Wentz, MD by Abel PrestoKara Demonbreun, ED Scribe.  This patient was seen in room TR08C/TR08C and the patient's care was started 10:27 AM.     Chief Complaint  Patient presents with  . Knee Pain    The history is provided by the patient. No language interpreter was used.   HPI Comments: James Robinson is a 53 y.o. male who presents to the Emergency Department complaining of sharp 10/10, pain on inside of right knee with onset 7 days ago.  Pt states he slipped on a lemon at work and his knee buckled underneath him. He states pain increased 2 days ago.  He states pain radiates down his right tibia. Pt notes associated swelling. Pt states pain worsens at night and his ROM is limited. Pt is able to ambulate but resorts to using a cane at home when needed. Pt has taken 800 mg of ibuprofen for the last 3 days with relief only within the last day. His occupation includes standing for long hours as he works as a Administrator, [employer] Service"professional waiter" which he states he is unable to do currently. Pt denies previous surgery on knee or having been seen by an orthopedist in the past.   Past Medical History  Diagnosis Date  . Hypertension    History reviewed. No pertinent past surgical history. No family history on file. History  Substance Use Topics  . Smoking status: Never Smoker   . Smokeless tobacco: Not on file  . Alcohol Use: Yes    Review of Systems  Constitutional: Negative for fever.  Musculoskeletal: Positive for myalgias and arthralgias.      Allergies  Review of patient's allergies indicates no known allergies.  Home Medications   Prior to Admission medications   Medication Sig Start Date End Date Taking? Authorizing Provider  acetaminophen (TYLENOL) 500 MG tablet Take 500 mg by mouth every 6 (six) hours as needed.    Historical Provider, MD   clindamycin (CLEOCIN) 150 MG capsule Take 1 capsule (150 mg total) by mouth every 6 (six) hours. 06/17/13   Fayrene HelperBowie Tran, PA-C  HYDROcodone-acetaminophen (NORCO/VICODIN) 5-325 MG per tablet Take 1 tablet by mouth every 6 (six) hours as needed for moderate pain. 06/17/13   Fayrene HelperBowie Tran, PA-C  ibuprofen (ADVIL,MOTRIN) 200 MG tablet Take 200 mg by mouth every 6 (six) hours as needed for moderate pain.    Historical Provider, MD  ibuprofen (ADVIL,MOTRIN) 800 MG tablet Take 1 tablet (800 mg total) by mouth 3 (three) times daily. 05/22/13   Arie Sabinaatherine E Schinlever, PA-C  meloxicam (MOBIC) 7.5 MG tablet Take 1 tablet (7.5 mg total) by mouth daily. 12/21/13   Junius FinnerErin O'Malley, PA-C  traMADol (ULTRAM) 50 MG tablet Take 1 tablet (50 mg total) by mouth every 6 (six) hours as needed. 12/21/13   Junius FinnerErin O'Malley, PA-C   BP 143/60 mmHg  Pulse 76  Temp(Src) 98.5 F (36.9 C) (Oral)  Resp 18  Ht 5\' 11"  (1.803 m)  Wt 186 lb (84.369 kg)  BMI 25.95 kg/m2  SpO2 97% Physical Exam  Constitutional: He is oriented to person, place, and time. He appears well-developed and well-nourished.  HENT:  Head: Normocephalic and atraumatic.  Eyes: EOM are normal.  Neck: Normal range of motion.  Cardiovascular: Normal rate.   Pulses:      Dorsalis pedis pulses are 2+ on  the right side.       Posterior tibial pulses are 2+ on the right side.  Pulmonary/Chest: Effort normal.  Musculoskeletal: He exhibits edema and tenderness.  Mild edema to medial aspect of right knee Knee flexion limited to 70 degrees due to pain, no crepitus. Tenderness along medial joint space. No tenderness over patella or lateral aspect of knee  Neurological: He is alert and oriented to person, place, and time.  Skin: Skin is warm and dry.  Skin in tact. No ecchymosis, erythema, or warmth. No red streaking, induration, or evidence of underlying infection.  Psychiatric: He has a normal mood and affect. His behavior is normal.  Nursing note and vitals  reviewed.   ED Course  Procedures (including critical care time)  DIAGNOSTIC STUDIES: Oxygen Saturation is 97% on room air, normal by my interpretation.    COORDINATION OF CARE: 10:34 AM Discussed treatment plan with patient at beside, the patient agrees with the plan and has no further questions at this time.   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 No patellar fracture is seen. Small joint effusion is noted. No acute fracture or dislocation is identified. Degenerative changes are noted in all 3 joint compartments. No gross soft tissue abnormality is seen.  IMPRESSION: Small joint effusion as well as degenerative change.  Stable bipartite patella.   Electronically Signed   By: Alcide CleverMark  Lukens M.D.   On: 12/21/2013 10:32     EKG Interpretation None      MDM   Final diagnoses:  Knee pain, right  Knee effusion, right   Pt c/o right knee pain after twisting it when he slipped on a lemon at work. Denies fall. Right leg is neurovascularly in tact. Tenderness along medial aspect of right knee. Plain films: significant for small joint effusion as well as degenerative change. Stable bipartite patella.  Cannot r/o soft tissue injury including meniscal tear or MCL injury. Will place pt in knee immobilizer for comfort, crutches provided. Discussed continued RICE tx. Advised to f/u with Dr. Magnus IvanBlackman, orthopedist, for further evaluation and treatment of continued pain. Pt verbalized understanding and agreement with tx plan.   I personally performed the services described in this documentation, which was scribed in my presence. The recorded information has been reviewed and is accurate.     Junius Finnerrin O'Malley, PA-C 12/21/13  1603  Flint MelterElliott L Wentz, MD 12/21/13 1725

## 2013-12-21 NOTE — ED Notes (Signed)
Ortho returned page  

## 2013-12-21 NOTE — Progress Notes (Signed)
Orthopedic Tech Progress Note Patient Details:  Lacie Draftric L Leuthold [birth year] [id number] Knee immobilizer applied to RLE. Crutches fit for height and comfort. Ortho Devices Type of Ortho Device: Crutches, Knee Immobilizer Ortho Device/Splint Location: RLE Ortho Device/Splint Interventions: Application   Asia R Thompson 12/21/2013, 11:15 AM

## 2013-12-21 NOTE — ED Notes (Signed)
Pt presents from home with c/o right knee pain. Pt reports slipped at work a week ago, reports has been trying RICE, along with 800mg  Ibuprofen, and heat with no relief of pain.

## 2014-01-01 ENCOUNTER — Encounter: Payer: Self-pay | Admitting: Family Medicine

## 2014-01-01 ENCOUNTER — Ambulatory Visit: Payer: Self-pay | Attending: Family Medicine | Admitting: Family Medicine

## 2014-01-01 ENCOUNTER — Ambulatory Visit (HOSPITAL_BASED_OUTPATIENT_CLINIC_OR_DEPARTMENT_OTHER): Payer: Worker's Compensation | Admitting: *Deleted

## 2014-01-01 VITALS — BP 113/70 | HR 98 | Temp 98.5°F | Resp 18 | Ht 71.0 in | Wt 210.0 lb

## 2014-01-01 DIAGNOSIS — F17200 Nicotine dependence, unspecified, uncomplicated: Secondary | ICD-10-CM | POA: Insufficient documentation

## 2014-01-01 DIAGNOSIS — K625 Hemorrhage of anus and rectum: Secondary | ICD-10-CM | POA: Insufficient documentation

## 2014-01-01 DIAGNOSIS — M25561 Pain in right knee: Secondary | ICD-10-CM | POA: Insufficient documentation

## 2014-01-01 DIAGNOSIS — Z299 Encounter for prophylactic measures, unspecified: Secondary | ICD-10-CM

## 2014-01-01 DIAGNOSIS — I10 Essential (primary) hypertension: Secondary | ICD-10-CM | POA: Insufficient documentation

## 2014-01-01 DIAGNOSIS — Z720 Tobacco use: Secondary | ICD-10-CM

## 2014-01-01 DIAGNOSIS — Z418 Encounter for other procedures for purposes other than remedying health state: Secondary | ICD-10-CM

## 2014-01-01 DIAGNOSIS — N400 Enlarged prostate without lower urinary tract symptoms: Secondary | ICD-10-CM | POA: Insufficient documentation

## 2014-01-01 DIAGNOSIS — Z23 Encounter for immunization: Secondary | ICD-10-CM

## 2014-01-01 DIAGNOSIS — Z8679 Personal history of other diseases of the circulatory system: Secondary | ICD-10-CM

## 2014-01-01 LAB — CBC
HCT: 37.1 % — ABNORMAL LOW (ref 39.0–52.0)
Hemoglobin: 13.3 g/dL (ref 13.0–17.0)
MCH: 33.8 pg (ref 26.0–34.0)
MCHC: 35.8 g/dL (ref 30.0–36.0)
MCV: 94.2 fL (ref 78.0–100.0)
MPV: 8.9 fL — AB (ref 9.4–12.4)
Platelets: 279 10*3/uL (ref 150–400)
RBC: 3.94 MIL/uL — ABNORMAL LOW (ref 4.22–5.81)
RDW: 13.1 % (ref 11.5–15.5)
WBC: 8.5 10*3/uL (ref 4.0–10.5)

## 2014-01-01 LAB — HEMOCCULT GUIAC POC 1CARD (OFFICE): FECAL OCCULT BLD: POSITIVE

## 2014-01-01 MED ORDER — BUPROPION HCL ER (SR) 150 MG PO TB12: 150.0000 mg | ORAL_TABLET | Freq: Two times a day (BID) | ORAL | Status: DC

## 2014-01-01 MED ORDER — ALBUTEROL SULFATE HFA 108 (90 BASE) MCG/ACT IN AERS: 2.0000 | INHALATION_SPRAY | Freq: Four times a day (QID) | RESPIRATORY_TRACT | Status: DC | PRN

## 2014-01-01 NOTE — Patient Instructions (Signed)
[name],  Thank you for coming in today. It was a pleasure meeting you. I look forward to being your primary doctor.   1. History of HTN: normal BP. Avoid extra salt on food, caffeine and nicotine to keep BP normal. If needed we will restart BP medicine.   2. Bleeding from rectum: positive microscopic blood in stool. You need to have a colonoscopy as a follow up exam. I have placed a GI referral please apply for orange card and [place] discount.   3. Smoker: Smoking cessation support: smoking cessation hotline: 1-800-QUIT-NOW.  Smoking cessation classes are available through [facility]. Call [phone] or visit our website at [email or web address]. Set quit date.  I recommend a quit date 7 days after starting wellbutirn.  Start wellbutrin 150 mg in the morning for 3 days, then 150 mg twice daily, 8 hrs apart, last dose before 6 pm.   You will be called with lab results.   F/u in 4 weeks for smoking quitting follow up and  BP check.   Dr. Armen PickupFunches

## 2014-01-01 NOTE — Assessment & Plan Note (Signed)
A: current smoker. Ready to quit P: Smoking cessation resources Start wellbutrin

## 2014-01-01 NOTE — Assessment & Plan Note (Signed)
A: slightly enlarged prostate P: Screening PSA

## 2014-01-01 NOTE — Progress Notes (Signed)
   Subjective:    Patient ID: James Robinson, male    DOB: 1960/06/11, 53 y.o.   MRN: [id number] CC: new patient, establish care, physical HPI Complaints/concerns: chronic R knee pain with intermittent flare ups.   Soc hx: current smoker, off and on since age 53, smoking regularly for past 8 years  Med Hx: HTN dx in 2008 Fam Hx: mother with glaucoma  Review of Systems  General:  Negative for unexplained weight loss, fever Skin: Negative for new or changing mole, sore that won't heal HEENT: Negative for trouble hearing, trouble seeing, ringing in ears, mouth sores, hoarseness, change in voice, dysphagia. CV:  Negative for chest pain, dyspnea, edema, palpitations Resp: Negative for cough, dyspnea, hemoptysis GI:  Positive for painless bright red blood per rectum first noticed in 30s. Negative for nausea, vomiting, diarrhea, constipation, abdominal pain, melena, hematochezia. GU: positive for nocturia x 2 episodes at night. Negative for dysuria, incontinence, urinary hesitance, hematuria, vaginal or penile discharge, polyuria, sexual difficulty, lumps in testicle or breasts MSK: R knee pain.  Negative for muscle cramps or aches, joint pain or swelling Neuro: Negative for headaches, weakness, numbness, dizziness, passing out/fainting Psych: Negative for depression, anxiety, memory problems    Objective:   Physical Exam BP 113/70 mmHg  Pulse 98  Temp(Src) 98.5 F (36.9 C) (Oral)  Resp 18  Ht 5\' 11"  (1.803 m)  Wt 210 lb (95.255 kg)  BMI 29.30 kg/m2  SpO2 99%  BP Readings from Last 3 Encounters:  01/01/14 113/70  12/21/13 143/60  06/17/13 151/92     General Appearance:    Alert, cooperative, no distress, appears stated age  Head:    Normocephalic, without obvious abnormality, atraumatic  Eyes:    PERRL, conjunctiva/corneas clear, EOM's intact, fundi    benign, both eyes       Ears:    Normal TM's and external ear canals, both ears  Nose:   Nares normal, septum midline, mucosa  normal, no drainage   or sinus tenderness  Throat:   Lips, mucosa, and tongue normal; poor dentition with loose and painful R upper molar.   Neck:   Supple, symmetrical, trachea midline, no adenopathy;       thyroid:  No enlargement/tenderness/nodules; no carotid   bruit or JVD  Back:     Symmetric, no curvature, ROM normal, no CVA tenderness  Lungs:     Clear to auscultation bilaterally, respirations unlabored  Chest wall:    No tenderness or deformity  Heart:    Regular rate and rhythm, S1 and S2 normal, no murmur, rub   or gallop  Abdomen:     Soft, non-tender, bowel sounds active all four quadrants,    no masses, no organomegaly  Genitalia:    Normal male without lesion, discharge or tenderness  Rectal:    Normal tone, prostate slightly firm and enlarged, no masses or tenderness; Guaiac positive stool  Extremities:   Extremities normal, atraumatic, no cyanosis or edema  Pulses:   2+ and symmetric all extremities  Skin:   Skin color, texture, turgor normal, no rashes or lesions  Lymph nodes:   Cervical, supraclavicular, and axillary nodes normal  Neurologic:   CNII-XII intact. Normal strength, sensation and reflexes      throughout         Assessment & Plan:

## 2014-01-01 NOTE — Assessment & Plan Note (Signed)
A; BRBPR off and on since 36, recurrence x 2 weeks, painless, internal hemorrhoids likely P: GI referral for diagnostic colonoscopy

## 2014-01-01 NOTE — Assessment & Plan Note (Signed)
A; normotensive today P:  F/u q visit

## 2014-01-01 NOTE — Progress Notes (Signed)
Establish Care Annual Physical

## 2014-01-02 ENCOUNTER — Encounter: Payer: Self-pay | Admitting: Nurse Practitioner

## 2014-01-02 LAB — COMPLETE METABOLIC PANEL WITH GFR
ALBUMIN: 4 g/dL (ref 3.5–5.2)
ALT: 38 U/L (ref 0–53)
AST: 23 U/L (ref 0–37)
Alkaline Phosphatase: 68 U/L (ref 39–117)
BUN: 14 mg/dL (ref 6–23)
CALCIUM: 8.8 mg/dL (ref 8.4–10.5)
CHLORIDE: 107 meq/L (ref 96–112)
CO2: 25 mEq/L (ref 19–32)
Creat: 0.79 mg/dL (ref 0.50–1.35)
GFR, Est Non African American: >89 mL/min
Glucose, Bld: 112 mg/dL — ABNORMAL HIGH (ref 70–99)
POTASSIUM: 3.8 meq/L (ref 3.5–5.3)
Sodium: 141 mEq/L (ref 135–145)
Total Bilirubin: 0.4 mg/dL (ref 0.2–1.2)
Total Protein: 7 g/dL (ref 6.0–8.3)

## 2014-01-02 LAB — LIPID PANEL
Cholesterol: 126 mg/dL (ref 0–200)
HDL: 44 mg/dL (ref >39)
LDL CALC: 62 mg/dL (ref 0–99)
Total CHOL/HDL Ratio: 2.9 Ratio
Triglycerides: 100 mg/dL (ref <150)
VLDL: 20 mg/dL (ref 0–40)

## 2014-01-02 LAB — PSA: PSA: 3.45 ng/mL (ref <=4.00)

## 2014-01-09 ENCOUNTER — Telehealth: Payer: Self-pay | Admitting: *Deleted

## 2014-01-09 ENCOUNTER — Ambulatory Visit: Payer: Self-pay | Admitting: Nurse Practitioner

## 2014-01-09 NOTE — Telephone Encounter (Signed)
-----   Message from Lora PaulaJosalyn C Funches, MD sent at 01/02/2014  1:45 PM EST ----- Normal labs including PSA, plan for repeat in one year

## 2014-01-09 NOTE — Telephone Encounter (Signed)
Pt aware of lab results 

## 2014-01-29 ENCOUNTER — Ambulatory Visit: Payer: Self-pay

## 2014-12-31 ENCOUNTER — Emergency Department (HOSPITAL_COMMUNITY)
Admission: EM | Admit: 2014-12-31 | Discharge: 2015-01-01 | Disposition: A | Discharge status: Home / Self Care | Payer: Self-pay | Attending: Emergency Medicine | Admitting: Emergency Medicine

## 2014-12-31 ENCOUNTER — Emergency Department (HOSPITAL_COMMUNITY): Payer: Self-pay

## 2014-12-31 ENCOUNTER — Encounter (HOSPITAL_COMMUNITY): Payer: Self-pay | Admitting: *Deleted

## 2014-12-31 DIAGNOSIS — M1990 Unspecified osteoarthritis, unspecified site: Secondary | ICD-10-CM | POA: Insufficient documentation

## 2014-12-31 DIAGNOSIS — G43909 Migraine, unspecified, not intractable, without status migrainosus: Secondary | ICD-10-CM | POA: Insufficient documentation

## 2014-12-31 DIAGNOSIS — Z79899 Other long term (current) drug therapy: Secondary | ICD-10-CM | POA: Insufficient documentation

## 2014-12-31 DIAGNOSIS — F17210 Nicotine dependence, cigarettes, uncomplicated: Secondary | ICD-10-CM | POA: Insufficient documentation

## 2014-12-31 DIAGNOSIS — I10 Essential (primary) hypertension: Secondary | ICD-10-CM | POA: Insufficient documentation

## 2014-12-31 HISTORY — DX: Unspecified osteoarthritis, unspecified site: M19.90

## 2014-12-31 MED ORDER — MORPHINE SULFATE (PF) 4 MG/ML IV SOLN
4.0000 mg | Freq: Once | INTRAVENOUS | Status: AC
  Administered 2014-12-31: 4 mg via INTRAVENOUS
  Filled 2014-12-31: qty 1

## 2014-12-31 MED ORDER — METOCLOPRAMIDE HCL 5 MG/ML IJ SOLN
10.0000 mg | Freq: Once | INTRAMUSCULAR | Status: AC
  Administered 2014-12-31: 10 mg via INTRAVENOUS
  Filled 2014-12-31: qty 2

## 2014-12-31 MED ORDER — VALPROATE SODIUM 500 MG/5ML IV SOLN
500.0000 mg | INTRAVENOUS | Status: AC
  Administered 2014-12-31: 500 mg via INTRAVENOUS
  Filled 2014-12-31: qty 5

## 2014-12-31 MED ORDER — DIPHENHYDRAMINE HCL 50 MG/ML IJ SOLN
12.5000 mg | Freq: Once | INTRAMUSCULAR | Status: AC
  Administered 2014-12-31: 12.5 mg via INTRAVENOUS
  Filled 2014-12-31: qty 1

## 2014-12-31 MED ORDER — SODIUM CHLORIDE 0.9 % IV BOLUS (SEPSIS)
1000.0000 mL | Freq: Once | INTRAVENOUS | Status: AC
  Administered 2014-12-31: 1000 mL via INTRAVENOUS

## 2014-12-31 MED ORDER — KETOROLAC TROMETHAMINE 30 MG/ML IJ SOLN
30.0000 mg | Freq: Once | INTRAMUSCULAR | Status: AC
  Administered 2014-12-31: 30 mg via INTRAVENOUS
  Filled 2014-12-31: qty 1

## 2014-12-31 MED ORDER — NAPROXEN 375 MG PO TABS: 375.0000 mg | ORAL_TABLET | Freq: Two times a day (BID) | ORAL | Status: DC | PRN

## 2014-12-31 MED ORDER — METOCLOPRAMIDE HCL 10 MG PO TABS: 10.0000 mg | ORAL_TABLET | Freq: Four times a day (QID) | ORAL | Status: DC

## 2014-12-31 NOTE — ED Provider Notes (Signed)
CSN: [id number]     Arrival date & time 12/31/14  1705 History   First MD Initiated Contact with Patient 12/31/14 2008     Chief Complaint  Patient presents with  . Migraine     (Consider location/radiation/quality/duration/timing/severity/associated sxs/prior Treatment) HPI Comments: This is a 54 year old African-American male with a 23 year history of migraine headaches.  States that he has had a headache intermittently for the past 2 weeks, initially resolves with Excedrin, but returns shortly after he states headaches normally were in the evening, but now he is getting headaches throughout the day.  He cannot identify a trigger.  Denies any rhinitis or nasal congestion.  A friend suggested that he have his blood pressure checked on initial examination in the emergency department was 140/85.  It's been re-checked several times and its in 130s over 80.  James Robinson has no family history of high blood pressure has never been told in the past that he needed to take medication for hypertension. He states that he has eyes checked, approximately year ago.  He has not noticed any change in his visual acuity  Patient is a 54 y.o. male presenting with migraines. The history is provided by the patient.  Migraine This is a recurrent problem. The current episode started 1 to 4 weeks ago. The problem occurs intermittently. The problem has been unchanged. Associated symptoms include headaches and nausea. Pertinent negatives include no chills, fever or vomiting. Nothing aggravates the symptoms. He has tried nothing for the symptoms. The treatment provided no relief.    Past Medical History  Diagnosis Date  . Migraine Dx 1985  . Hypertension Dx 2008    on BP medicine for a brief period   . Arthritis    History reviewed. No pertinent past surgical history. Family History  Problem Relation Age of Onset  . Diabetes Neg Hx   . Heart disease Neg Hx   . Cancer Neg Hx   . Glaucoma Mother    Social History   Substance Use Topics  . Smoking status: Current Every Day Smoker -- 0.50 packs/day for 27 years    Types: Cigarettes  . Smokeless tobacco: Never Used  . Alcohol Use: No     Comment: social    Review of Systems  Constitutional: Negative for fever and chills.  Eyes: Negative for photophobia and visual disturbance.  Gastrointestinal: Positive for nausea. Negative for vomiting.  Neurological: Positive for headaches. Negative for dizziness.  All other systems reviewed and are negative.     Allergies  Review of patient's allergies indicates no known allergies.  Home Medications   Prior to Admission medications   Medication Sig Start Date End Date Taking? Authorizing Provider  albuterol (PROVENTIL HFA;VENTOLIN HFA) 108 (90 BASE) MCG/ACT inhaler Inhale 2 puffs into the lungs every 6 (six) hours as needed for wheezing or shortness of breath. 01/01/14  Yes Dessa PhiJosalyn Funches, MD  aspirin-acetaminophen-caffeine (EXCEDRIN MIGRAINE) [phone] MG tablet Take 1 tablet by mouth every 6 (six) hours as needed for headache.   Yes Historical Provider, MD  buPROPion (WELLBUTRIN SR) 150 MG 12 hr tablet Take 1 tablet (150 mg total) by mouth 2 (two) times daily. Patient not taking: Reported on 12/31/2014 01/01/14   Dessa PhiJosalyn Funches, MD  ibuprofen (ADVIL,MOTRIN) 800 MG tablet Take 1 tablet (800 mg total) by mouth 3 (three) times daily. Patient not taking: Reported on 12/31/2014 05/22/13   Ruby Colaatherine Schinlever, PA-C  meloxicam (MOBIC) 7.5 MG tablet Take 1 tablet (7.5 mg total) by mouth daily.  Patient not taking: Reported on 12/31/2014 12/21/13   Junius Finner, PA-C  metoCLOPramide (REGLAN) 10 MG tablet Take 1 tablet (10 mg total) by mouth every 6 (six) hours. 12/31/14   Earley Favor, NP  naproxen (NAPROSYN) 375 MG tablet Take 1 tablet (375 mg total) by mouth 2 (two) times daily as needed for headache. 12/31/14   Earley Favor, NP  traMADol (ULTRAM) 50 MG tablet Take 1 tablet (50 mg total) by mouth every 6 (six)  hours as needed. Patient not taking: Reported on 12/31/2014 12/21/13   Junius Finner, PA-C   BP 142/66 mmHg  Pulse 78  Temp(Src) 98.1 F (36.7 C) (Oral)  Resp 16  Ht  (1.803 m)  Wt 87.091 kg  BMI 26.79 kg/m2  SpO2 97% Physical Exam  Constitutional: He is oriented to person, place, and time. He appears well-developed and well-nourished.  HENT:  Head: Normocephalic.  Right Ear: External ear normal.  Left Ear: External ear normal.  Mouth/Throat: Oropharynx is clear and moist.  Eyes: Pupils are equal, round, and reactive to light.  Neck: Normal range of motion.  Cardiovascular: Normal rate.   Pulmonary/Chest: Effort normal.  Musculoskeletal: Normal range of motion.  Neurological: He is alert and oriented to person, place, and time.  Skin: Skin is warm and dry.  Nursing note and vitals reviewed.   ED Course  Procedures (including critical care time) Labs Review Labs Reviewed - No data to display  Imaging Review Ct Head Wo Contrast  12/31/2014  CLINICAL DATA:  Left-sided headache for 3 weeks, getting worse. Possible passing out spell. EXAM: CT HEAD WITHOUT CONTRAST TECHNIQUE: Contiguous axial images were obtained from the base of the skull through the vertex without intravenous contrast. COMPARISON:  None. FINDINGS: Ventricles and sulci appear symmetrical. No mass effect or midline shift. No abnormal extra-axial fluid collections. Gray-white matter junctions are distinct. Basal cisterns are not effaced. No evidence of acute intracranial hemorrhage. No depressed skull fractures. Visualized paranasal sinuses and mastoid air cells are not opacified. IMPRESSION: No acute intracranial abnormalities. Electronically Signed   By: Burman Nieves M.D.   On: 12/31/2014 23:41   I have personally reviewed and evaluated these images and lab results as part of my medical decision-making.   EKG Interpretation None     has been seen by case management in appointment for follow-up.  Has  been established for him next week He has been given IV fluids, Toradol, Benadryl and Reglan  Patient reassessed.  He no longer has a headache.  He will be discharged home with a prescription for Reglan and ibuprofen to take as needed Patient was being prepared for discharge when he suddenly developed increased pain and watering of his eye and nausea.  I have asked that he be given IV morphine and a CT scan of his head Patient was given IV Depakote with total resolution of his headache.  He also had a head CT scan which is negative.  He has a follow-up appointment never ever 20th with his PCP.  He has been given a prescription for Reglan MDM   Final diagnoses:  Migraine without status migrainosus, not intractable, unspecified migraine type         Earley Favor, NP 12/31/14 2131  Earley Favor, NP 01/01/15 4098  Arby Barrette, MD 01/07/15 1416

## 2014-12-31 NOTE — ED Notes (Signed)
Peanut butter crackers and water given per NP. Headache improving stating he hasn't ate and wanted to attempt to eat

## 2014-12-31 NOTE — Care Management (Signed)
ED CM noted that patient has been followed in the past with the [facility] for similar complaints. CM met with patient at bedside discussed that patient has not had follow up in almost a year, and would need to reestablish care at the clinic patient is amendable. Appointment was then scheduled at [facility] for 11/28 at 5:15p patient verbalized understanding teach back done. Orson Gear NP. No further ED CM needs identified.

## 2014-12-31 NOTE — Discharge Instructions (Signed)
Migraine Headache A migraine headache is very bad, throbbing pain on one or both sides of your head. Talk to your doctor about what things may bring on (trigger) your migraine headaches. HOME CARE  Only take medicines as told by your doctor.  Lie down in a dark, quiet room when you have a migraine.  Keep a journal to find out if certain things bring on migraine headaches. For example, write down:  What you eat and drink.  How much sleep you get.  Any change to your diet or medicines.  Lessen how much alcohol you drink.  Quit smoking if you smoke.  Get enough sleep.  Lessen any stress in your life.  Keep lights dim if bright lights bother you or make your migraines worse. GET HELP RIGHT AWAY IF:   Your migraine becomes really bad.  You have a fever.  You have a stiff neck.  You have trouble seeing.  Your muscles are weak, or you lose muscle control.  You lose your balance or have trouble walking.  You feel like you will pass out (faint), or you pass out.  You have really bad symptoms that are different than your first symptoms. MAKE SURE YOU:   Understand these instructions.  Will watch your condition.  Will get help right away if you are not doing well or get worse.   This information is not intended to replace advice given to you by your health care provider. Make sure you discuss any questions you have with your health care provider.   Document Released: 11/05/2007 Document Revised: 04/20/2011 Document Reviewed: 10/03/2012 Elsevier Interactive Patient Education 2016 [employer] You have been given a prescription for Reglan and Naprosyn that you can take at the onset of your headache.  This is for both pain and nausea, neck should keep your appointment as scheduled on November 28 with your primary care physician to have your blood pressure rechecked

## 2014-12-31 NOTE — ED Notes (Signed)
Pt c/o migraines x 2 weeks. States that he has been taking Excedrin migraine which helps but the headaches always come back. States that his family told him to have his BP checked.

## 2014-12-31 NOTE — ED Notes (Signed)
Pt reports sleeping after migraine cocktail when migraine returned, pain 10/10. Morphine and valproate started

## 2015-01-01 NOTE — ED Notes (Signed)
Pt ambulatory to restroom with steady gait; denies dizziness or return of headache

## 2015-01-07 ENCOUNTER — Inpatient Hospital Stay: Payer: Self-pay | Admitting: Family Medicine

## 2015-01-07 ENCOUNTER — Ambulatory Visit: Payer: Self-pay | Admitting: Family Medicine

## 2015-01-28 ENCOUNTER — Encounter: Payer: Self-pay | Admitting: Family Medicine

## 2015-01-28 ENCOUNTER — Ambulatory Visit: Payer: Self-pay | Attending: Family Medicine | Admitting: Family Medicine

## 2015-01-28 VITALS — BP 124/71 | HR 80 | Temp 98.5°F | Resp 16 | Ht 71.0 in | Wt 208.0 lb

## 2015-01-28 DIAGNOSIS — F17200 Nicotine dependence, unspecified, uncomplicated: Secondary | ICD-10-CM

## 2015-01-28 DIAGNOSIS — Z720 Tobacco use: Secondary | ICD-10-CM | POA: Insufficient documentation

## 2015-01-28 DIAGNOSIS — Z8679 Personal history of other diseases of the circulatory system: Secondary | ICD-10-CM | POA: Insufficient documentation

## 2015-01-28 DIAGNOSIS — Z7982 Long term (current) use of aspirin: Secondary | ICD-10-CM | POA: Insufficient documentation

## 2015-01-28 DIAGNOSIS — F17210 Nicotine dependence, cigarettes, uncomplicated: Secondary | ICD-10-CM | POA: Insufficient documentation

## 2015-01-28 DIAGNOSIS — I10 Essential (primary) hypertension: Secondary | ICD-10-CM | POA: Insufficient documentation

## 2015-01-28 DIAGNOSIS — G43109 Migraine with aura, not intractable, without status migrainosus: Secondary | ICD-10-CM | POA: Insufficient documentation

## 2015-01-28 MED ORDER — ALBUTEROL SULFATE HFA 108 (90 BASE) MCG/ACT IN AERS: 2.0000 | INHALATION_SPRAY | Freq: Four times a day (QID) | RESPIRATORY_TRACT | Status: DC | PRN

## 2015-01-28 MED ORDER — SUMATRIPTAN SUCCINATE 50 MG PO TABS: 50.0000 mg | ORAL_TABLET | ORAL | Status: DC | PRN

## 2015-01-28 NOTE — Patient Instructions (Addendum)
James Robinson was seen today for establish care and hospitalization follow-up.  Diagnoses and all orders for this visit:  Migraine with aura and without status migrainosus, not intractable -     SUMAtriptan (IMITREX) 50 MG tablet; Take 1 tablet (50 mg total) by mouth every 2 (two) hours as needed for migraine. May repeat in 2 hours if headache persists or recurs.  Current smoker -     albuterol (PROVENTIL HFA;VENTOLIN HFA) 108 (90 BASE) MCG/ACT inhaler; Inhale 2 puffs into the lungs every 6 (six) hours as needed for wheezing or shortness of breath.    F/u in 4 weeks for HA and smoking cessation  Dr. Armen PickupFunches

## 2015-01-28 NOTE — Progress Notes (Signed)
Patient ID: James Robinson, male   DOB: May 25, 1960, 54 y.o.   MRN: [id number]   Subjective:  Patient ID: James Robinson, male    DOB: 09/03/1960  Age: 54 y.o. MRN: [id number]  CC: Establish Care and Hospitalization Follow-up   HPI James Robinson presents for    1.  ED f/u migraine:  He was first diagnosed with migraines in 20s. patient see in ED on 12/31/14 for migraine HA. BP 140/80. HR 68. CT head negative for acute findings. No HA now. HAs occur at night mostly. He is HA at times and tends to have a yearly cluster that last for about 2 weeks. This year his headaches returned at the end of October and have persisted.  They last 20-30 minutes when moderate. Severe HA  Last 1.5-2.5 hrs. Tends to start on R side at temple with a twitch. Then moves to the L temple with sharp temple pain. Associated with with watery L eye, and sensitivity to light and sound. He is a smoker. He rarely has headaches during the day. He has tried OTC excedrin migraine, reglan and naproxen which does not really help pain consistently. OTC BC/Goody's have been the best treatment so far. HAs occur 5 nights of the week. He wears reading glasses. Eyes were examined two years go he was advised to use reading glasses but did not need prescription glasses. No fever, chills, neck pain, nausea or emesis.   2. H/o HTN: patient has hx of HTN. He has been prescribed BP medicine in past. No meds now. He has HA as mentioned above. No CP or leg swelling. He gets SOB at times.   3. Smoking: he knows he should quit. He is SOB at times. He will return to discuss treatment.   Social History  Substance Use Topics  . Smoking status: Current Every Day Smoker -- 0.50 packs/day for 27 years    Types: Cigarettes  . Smokeless tobacco: Never Used  . Alcohol Use: No     Comment: social    Outpatient Prescriptions Prior to Visit  Medication Sig Dispense Refill  . albuterol (PROVENTIL HFA;VENTOLIN HFA) 108 (90 BASE) MCG/ACT inhaler Inhale 2  puffs into the lungs every 6 (six) hours as needed for wheezing or shortness of breath. 2 Inhaler 2  . aspirin-acetaminophen-caffeine (EXCEDRIN MIGRAINE) 250-250-65 MG tablet Take 1 tablet by mouth every 6 (six) hours as needed for headache.    Marland Kitchen buPROPion (WELLBUTRIN SR) 150 MG 12 hr tablet Take 1 tablet (150 mg total) by mouth 2 (two) times daily. (Patient not taking: Reported on 12/31/2014) 60 tablet 2  . ibuprofen (ADVIL,MOTRIN) 800 MG tablet Take 1 tablet (800 mg total) by mouth 3 (three) times daily. (Patient not taking: Reported on 12/31/2014) 12 tablet 0  . meloxicam (MOBIC) 7.5 MG tablet Take 1 tablet (7.5 mg total) by mouth daily. (Patient not taking: Reported on 12/31/2014) 30 tablet 0  . metoCLOPramide (REGLAN) 10 MG tablet Take 1 tablet (10 mg total) by mouth every 6 (six) hours. 30 tablet 0  . naproxen (NAPROSYN) 375 MG tablet Take 1 tablet (375 mg total) by mouth 2 (two) times daily as needed for headache. 20 tablet 0  . traMADol (ULTRAM) 50 MG tablet Take 1 tablet (50 mg total) by mouth every 6 (six) hours as needed. (Patient not taking: Reported on 12/31/2014) 15 tablet 0   No facility-administered medications prior to visit.    ROS Review of Systems  Constitutional: Negative for fever, chills,  fatigue and unexpected weight change.  Eyes: Negative for visual disturbance.  Respiratory: Negative for cough and shortness of breath.   Cardiovascular: Negative for chest pain, palpitations and leg swelling.  Gastrointestinal: Negative for nausea, vomiting, abdominal pain, diarrhea, constipation and blood in stool.  Endocrine: Negative for polydipsia, polyphagia and polyuria.  Musculoskeletal: Negative for myalgias, back pain, arthralgias, gait problem and neck pain.  Skin: Negative for rash.  Allergic/Immunologic: Negative for immunocompromised state.  Neurological: Positive for headaches.  Hematological: Negative for adenopathy. Does not bruise/bleed easily.    Psychiatric/Behavioral: Negative for suicidal ideas, sleep disturbance and dysphoric mood. The patient is not nervous/anxious.     Objective:  BP 124/71 mmHg  Pulse 80  Temp(Src) 98.5 F (36.9 C) (Oral)  Resp 16  Ht 5\' 11"  (1.803 m)  Wt 208 lb (94.348 kg)  BMI 29.02 kg/m2  SpO2 97%  BP/Weight 01/28/2015 01/01/2015 12/31/2014  Systolic BP 124 140 -  Diastolic BP 71 80 -  Wt. (Lbs) 208 - 192  BMI 29.02 - 26.79   Physical Exam  Constitutional: He is oriented to person, place, and time. He appears well-developed and well-nourished. No distress.  HENT:  Head: Normocephalic and atraumatic.  Right Ear: External ear normal.  Left Ear: External ear normal.  Nose: Nose normal.  Mouth/Throat: Oropharynx is clear and moist.  Eyes: Conjunctivae and EOM are normal. Pupils are equal, round, and reactive to light.  Neck: Normal range of motion. Neck supple.  Cardiovascular: Normal rate, regular rhythm, normal heart sounds and intact distal pulses.   Pulmonary/Chest: Effort normal and breath sounds normal.  Musculoskeletal: He exhibits no edema.  Neurological: He is alert and oriented to person, place, and time. No cranial nerve deficit.  Skin: Skin is warm and dry. No rash noted. No erythema.  Psychiatric: He has a normal mood and affect.   Assessment & Plan:   Problem List Items Addressed This Visit    Current smoker (Chronic)    Smoking cessation addressed briefly Patient will return to discuss pharmacotherapy options        Relevant Medications   albuterol (PROVENTIL HFA;VENTOLIN HFA) 108 (90 BASE) MCG/ACT inhaler   H/O: HTN (hypertension) (Chronic)    Ho of HTN He remains normotensive in office BP elevated in setting of HA A BP medicine for migraine prevention like BB if HR tolerate or CCB will be initiated if HAs persist       Migraine headache - Primary (Chronic)    A: migraines P: imitrex Smoking cessation Patient to f/u if HA persist to start preventive therapy        Relevant Medications   SUMAtriptan (IMITREX) 50 MG tablet      No orders of the defined types were placed in this encounter.    Follow-up: No Follow-up on file.   Dessa PhiJosalyn Daymian Lill MD

## 2015-01-28 NOTE — Assessment & Plan Note (Signed)
Ho of HTN He remains normotensive in office BP elevated in setting of HA A BP medicine for migraine prevention like BB if HR tolerate or CCB will be initiated if HAs persist

## 2015-01-28 NOTE — Assessment & Plan Note (Signed)
A: migraines P: imitrex Smoking cessation Patient to f/u if HA persist to start preventive therapy

## 2015-01-28 NOTE — Progress Notes (Signed)
F/U HTN, Migraine No taking BP medication  No pain today  Tobacco user 1/2 ppday  No suicidal thoughts in the past two weeks

## 2015-01-28 NOTE — Assessment & Plan Note (Addendum)
Smoking cessation addressed briefly Patient will return to discuss pharmacotherapy options

## 2015-02-25 ENCOUNTER — Ambulatory Visit: Payer: Self-pay | Admitting: Family Medicine

## 2015-06-08 ENCOUNTER — Emergency Department (HOSPITAL_COMMUNITY): Payer: Self-pay

## 2015-06-08 ENCOUNTER — Encounter (HOSPITAL_COMMUNITY): Payer: Self-pay | Admitting: Emergency Medicine

## 2015-06-08 ENCOUNTER — Emergency Department (HOSPITAL_COMMUNITY)
Admission: EM | Admit: 2015-06-08 | Discharge: 2015-06-09 | Disposition: A | Discharge status: Home / Self Care | Payer: Self-pay | Attending: Emergency Medicine | Admitting: Emergency Medicine

## 2015-06-08 DIAGNOSIS — I10 Essential (primary) hypertension: Secondary | ICD-10-CM | POA: Insufficient documentation

## 2015-06-08 DIAGNOSIS — F17210 Nicotine dependence, cigarettes, uncomplicated: Secondary | ICD-10-CM | POA: Insufficient documentation

## 2015-06-08 DIAGNOSIS — M1990 Unspecified osteoarthritis, unspecified site: Secondary | ICD-10-CM | POA: Insufficient documentation

## 2015-06-08 DIAGNOSIS — M549 Dorsalgia, unspecified: Secondary | ICD-10-CM | POA: Insufficient documentation

## 2015-06-08 DIAGNOSIS — G43909 Migraine, unspecified, not intractable, without status migrainosus: Secondary | ICD-10-CM | POA: Insufficient documentation

## 2015-06-08 DIAGNOSIS — Z79899 Other long term (current) drug therapy: Secondary | ICD-10-CM | POA: Insufficient documentation

## 2015-06-08 DIAGNOSIS — R0789 Other chest pain: Secondary | ICD-10-CM | POA: Insufficient documentation

## 2015-06-08 LAB — I-STAT TROPONIN, ED: TROPONIN I, POC: 0 ng/mL (ref 0.00–0.08)

## 2015-06-08 LAB — CBC WITH DIFFERENTIAL/PLATELET
BASOS PCT: 0 %
Basophils Absolute: 0 10*3/uL (ref 0.0–0.1)
EOS ABS: 0.2 10*3/uL (ref 0.0–0.7)
Eosinophils Relative: 2 %
HCT: 37.6 % — ABNORMAL LOW (ref 39.0–52.0)
HEMOGLOBIN: 13 g/dL (ref 13.0–17.0)
Lymphocytes Relative: 28 %
Lymphs Abs: 2.5 10*3/uL (ref 0.7–4.0)
MCH: 33.4 pg (ref 26.0–34.0)
MCHC: 34.6 g/dL (ref 30.0–36.0)
MCV: 96.7 fL (ref 78.0–100.0)
Monocytes Absolute: 0.9 10*3/uL (ref 0.1–1.0)
Monocytes Relative: 10 %
NEUTROS PCT: 60 %
Neutro Abs: 5.3 10*3/uL (ref 1.7–7.7)
Platelets: 198 10*3/uL (ref 150–400)
RBC: 3.89 MIL/uL — AB (ref 4.22–5.81)
RDW: 12.8 % (ref 11.5–15.5)
WBC: 8.9 10*3/uL (ref 4.0–10.5)

## 2015-06-08 LAB — BASIC METABOLIC PANEL
ANION GAP: 9 (ref 5–15)
BUN: 15 mg/dL (ref 6–20)
CHLORIDE: 108 mmol/L (ref 101–111)
CO2: 22 mmol/L (ref 22–32)
CREATININE: 0.84 mg/dL (ref 0.61–1.24)
Calcium: 8.7 mg/dL — ABNORMAL LOW (ref 8.9–10.3)
GFR calc non Af Amer: >60 mL/min (ref >60)
Glucose, Bld: 109 mg/dL — ABNORMAL HIGH (ref 65–99)
POTASSIUM: 3.8 mmol/L (ref 3.5–5.1)
SODIUM: 139 mmol/L (ref 135–145)

## 2015-06-08 MED ORDER — KETOROLAC TROMETHAMINE 30 MG/ML IJ SOLN
30.0000 mg | Freq: Once | INTRAMUSCULAR | Status: AC
  Administered 2015-06-08: 30 mg via INTRAVENOUS
  Filled 2015-06-08: qty 1

## 2015-06-08 MED ORDER — SODIUM CHLORIDE 0.9 % IV BOLUS (SEPSIS)
1000.0000 mL | Freq: Once | INTRAVENOUS | Status: AC
  Administered 2015-06-08: 1000 mL via INTRAVENOUS

## 2015-06-08 NOTE — ED Notes (Signed)
Patient transported to X-ray 

## 2015-06-08 NOTE — ED Provider Notes (Signed)
CSN: [id number]     Arrival date & time 06/08/15  1918 History   First MD Initiated Contact with Patient 06/08/15 1947     Chief Complaint  Patient presents with  . Chest Pain     (Consider location/radiation/quality/duration/timing/severity/associated sxs/prior Treatment) HPI  55 year old male with a past history of migraines and hypertension presents with chest pain. He noticed it when he first woke up at 8 AM. Has been progressive since onset. Significantly worse whenever he raises his arms up over his head. Also worse with coughing or deep breathing. Pain is in his left chest left of the sternum. No obvious injury or lifting anything heavily. Took aspirin by EMS but otherwise no medicines tried. No fevers. Has some pain on the left side of his back but the pain does not go through his chest into his back. No weakness/numbness. No abdominal pain. No shortness of breath. Pain is currently a 7/10.  Past Medical History  Diagnosis Date  . Migraine Dx 1985  . Hypertension Dx 2008    on BP medicine for a brief period   . Arthritis    History reviewed. No pertinent past surgical history. Family History  Problem Relation Age of Onset  . Diabetes Neg Hx   . Heart disease Neg Hx   . Cancer Neg Hx   . Glaucoma Mother    Social History  Substance Use Topics  . Smoking status: Current Every Day Smoker -- 0.50 packs/day for 27 years    Types: Cigarettes  . Smokeless tobacco: Never Used  . Alcohol Use: No     Comment: social    Review of Systems  Constitutional: Negative for diaphoresis.  Respiratory: Negative for shortness of breath.   Cardiovascular: Positive for chest pain.  Gastrointestinal: Negative for nausea, vomiting and abdominal pain.  Musculoskeletal: Positive for back pain.  Neurological: Negative for dizziness, weakness and numbness.  All other systems reviewed and are negative.     Allergies  Review of patient's allergies indicates no known allergies.  Home  Medications   Prior to Admission medications   Medication Sig Start Date End Date Taking? Authorizing Provider  ibuprofen (ADVIL,MOTRIN) 200 MG tablet Take 400 mg by mouth every 6 (six) hours as needed.   Yes Historical Provider, MD  albuterol (PROVENTIL HFA;VENTOLIN HFA) 108 (90 BASE) MCG/ACT inhaler Inhale 2 puffs into the lungs every 6 (six) hours as needed for wheezing or shortness of breath. 01/28/15   Dessa Phi, MD  aspirin-acetaminophen-caffeine (EXCEDRIN MIGRAINE) 409-811-91 MG tablet Take 1 tablet by mouth every 6 (six) hours as needed for headache. Reported on 01/28/2015    Historical Provider, MD  naproxen (NAPROSYN) 375 MG tablet Take 1 tablet (375 mg total) by mouth 2 (two) times daily as needed for headache. 12/31/14   Earley Favor, NP  SUMAtriptan (IMITREX) 50 MG tablet Take 1 tablet (50 mg total) by mouth every 2 (two) hours as needed for migraine. May repeat in 2 hours if headache persists or recurs. 01/28/15   Josalyn Funches, MD   BP 161/78 mmHg  Pulse 76  Temp(Src) 97.7 F (36.5 C) (Oral)  Resp 15  Ht  (1.803 m)  Wt 200 lb (90.719 kg)  BMI 27.91 kg/m2  SpO2 92% Physical Exam  Constitutional: He is oriented to person, place, and time. He appears well-developed and well-nourished.  HENT:  Head: Normocephalic and atraumatic.  Right Ear: External ear normal.  Left Ear: External ear normal.  Nose: Nose normal.  Eyes: Right  eye exhibits no discharge. Left eye exhibits no discharge.  Neck: Neck supple.  Cardiovascular: Normal rate, regular rhythm, normal heart sounds and intact distal pulses.   Pulmonary/Chest: Effort normal and breath sounds normal. He exhibits tenderness.    Abdominal: Soft. There is no tenderness.  Musculoskeletal: He exhibits no edema.  Neurological: He is alert and oriented to person, place, and time.  Skin: Skin is warm and dry.  Nursing note and vitals reviewed.   ED Course  Procedures (including critical care time) Labs  Review Labs Reviewed  CBC WITH DIFFERENTIAL/PLATELET - Abnormal; Notable for the following:    RBC 3.89 (*)    HCT 37.6 (*)    All other components within normal limits  BASIC METABOLIC PANEL - Abnormal; Notable for the following:    Glucose, Bld 109 (*)    Calcium 8.7 (*)    All other components within normal limits  I-STAT TROPOININ, ED  Rosezena SensorI-STAT TROPOININ, ED    Imaging Review Dg Chest 2 View  06/08/2015  CLINICAL DATA:  Chest pain EXAM: CHEST  2 VIEW COMPARISON:  03/16/2006 FINDINGS: On the lateral projection there is suggestion of a 2 cm nodule overlapping the mid thoracic spine. This could be left posterior mediastinal based on the frontal radiograph. There is no edema, consolidation, effusion, or pneumothorax. Normal heart size and mediastinal contours. IMPRESSION: 1. No evidence of acute cardiopulmonary disease. 2. Possible pulmonary nodule. Recommend nonemergent noncontrast chest CT follow-up. Electronically Signed   By: Marnee SpringJonathon  Watts M.D.   On: 06/08/2015 21:04   I have personally reviewed and evaluated these images and lab results as part of my medical decision-making.   EKG Interpretation   Date/Time:  Saturday June 08 2015 19:25:45 EDT Ventricular Rate:  79 PR Interval:  159 QRS Duration: 102 QT Interval:  383 QTC Calculation: 439 R Axis:   48 Text Interpretation:  Sinus rhythm Normal ECG No old tracing to compare  Confirmed by Jeffrie Lofstrom  MD, Teyah Rossy [phone] on 06/08/2015 7:31:51 PM       EKG Interpretation  Date/Time:  Sunday June 09 2015 00:30:33 EDT Ventricular Rate:  69 PR Interval:  163 QRS Duration: 103 QT Interval:  400 QTC Calculation: 428 R Axis:   54 Text Interpretation:  Sinus rhythm no significant change from yesterday Confirmed by Sharrie Self  MD, Marguerette Sheller ([id number]) on 06/09/2015 12:34:35 AM       MDM   Final diagnoses:  Chest wall pain    Patient with atypical chest wall pain. I have low suspicion for ACS. He has 2 negative troponins and 2 benign  ECGs. Better with IV Toradol. Patient has no risk factors for PE and is not tachycardic, hypoxic, or increased work of breathing. He appears quite well. I really think this is a muscle issue. However he does have hypertension and smoking history. Will refer back to his PCP for outpatient workup. Discussed the pulmonary nodule seen on chest x-ray.    Pricilla LovelessScott Alejandrina Raimer, MD 06/09/15 [phone]

## 2015-06-08 NOTE — ED Notes (Signed)
Per EMS pt reports central/left sided chest pain that began this morning and has increased during the day.  Pain increases w/ movement and palpatation.  12 lead unremarkable.  Was given 324 ASA but no nitro as he stated that he has migraines and did not want a headache.

## 2015-06-09 LAB — I-STAT TROPONIN, ED: TROPONIN I, POC: 0 ng/mL (ref 0.00–0.08)

## 2015-06-09 MED ORDER — NAPROXEN 500 MG PO TABS: 500.0000 mg | ORAL_TABLET | Freq: Two times a day (BID) | ORAL | Status: DC

## 2015-06-25 ENCOUNTER — Ambulatory Visit (HOSPITAL_COMMUNITY)
Admission: EM | Admit: 2015-06-25 | Discharge: 2015-06-25 | Disposition: A | Discharge status: Home / Self Care | Payer: Self-pay | Attending: Family Medicine | Admitting: Family Medicine

## 2015-06-25 ENCOUNTER — Encounter (HOSPITAL_COMMUNITY): Payer: Self-pay | Admitting: Emergency Medicine

## 2015-06-25 DIAGNOSIS — L739 Follicular disorder, unspecified: Secondary | ICD-10-CM

## 2015-06-25 DIAGNOSIS — L089 Local infection of the skin and subcutaneous tissue, unspecified: Secondary | ICD-10-CM

## 2015-06-25 MED ORDER — DOXYCYCLINE HYCLATE 100 MG PO CAPS: 100.0000 mg | ORAL_CAPSULE | Freq: Two times a day (BID) | ORAL | Status: DC

## 2015-06-25 NOTE — ED Notes (Signed)
Patient reports history of boils.  Patient has had issues for 2 weeks.  Reports right is worse than left axilla.

## 2015-06-25 NOTE — ED Provider Notes (Signed)
CSN: [id number]     Arrival date & time 06/25/15  1259 History   First MD Initiated Contact with Patient 06/25/15 1324     Chief Complaint  Patient presents with  . Recurrent Skin Infections   (Consider location/radiation/quality/duration/timing/severity/associated sxs/prior Treatment) HPI Comments: 55 year old male complains of several "boils" in the right axilla. The were noticed approximately  2 weeks ago. They have increased in size. Denies fever, chills or other systemic symptoms. Denies other lesions.   Past Medical History  Diagnosis Date  . Migraine Dx 1985  . Hypertension Dx 2008    on BP medicine for a brief period   . Arthritis    History reviewed. No pertinent past surgical history. Family History  Problem Relation Age of Onset  . Diabetes Neg Hx   . Heart disease Neg Hx   . Cancer Neg Hx   . Glaucoma Mother    Social History  Substance Use Topics  . Smoking status: Current Every Day Smoker -- 0.50 packs/day for 27 years    Types: Cigarettes  . Smokeless tobacco: Never Used  . Alcohol Use: No     Comment: social    Review of Systems  Constitutional: Negative.   HENT: Negative.   Skin:       As per history of present illness  Psychiatric/Behavioral: Negative.   All other systems reviewed and are negative.   Allergies  Review of patient's allergies indicates no known allergies.  Home Medications   Prior to Admission medications   Medication Sig Start Date End Date Taking? Authorizing Provider  doxycycline (VIBRAMYCIN) 100 MG capsule Take 1 capsule (100 mg total) by mouth 2 (two) times daily. 06/25/15   Hayden Rasmussenavid Desirai Traxler, NP  MAGNESIUM PO Take 3-5 tablets by mouth daily.    Historical Provider, MD  naproxen (NAPROSYN) 500 MG tablet Take 1 tablet (500 mg total) by mouth 2 (two) times daily with a meal. 06/09/15   Pricilla LovelessScott Goldston, MD  POTASSIUM PO Take 3-5 tablets by mouth daily.    Historical Provider, MD   Meds Ordered and Administered this Visit  Medications -  No data to display  BP 130/74 mmHg  Pulse 87  Temp(Src) 97.7 F (36.5 C) (Oral)  Resp 16  SpO2 98% No data found.   Physical Exam  Constitutional: He is oriented to person, place, and time. He appears well-developed and well-nourished. No distress.  Eyes: EOM are normal.  Neck: Normal range of motion. Neck supple.  Cardiovascular: Normal rate.   Pulmonary/Chest: Effort normal. No respiratory distress.  Musculoskeletal: He exhibits no edema.  Neurological: He is alert and oriented to person, place, and time. He exhibits normal muscle tone.  Skin: Skin is warm and dry.  There are several tender pustules to the right axilla. No abscess formation. No erythema, lymphangitis. None of these lesions are draining or bleeding.  Psychiatric: He has a normal mood and affect.  Nursing note and vitals reviewed.   ED Course  Procedures (including critical care time)  Labs Review Labs Reviewed - No data to display  Imaging Review No results found.   Visual Acuity Review  Right Eye Distance:   Left Eye Distance:   Bilateral Distance:    Right Eye Near:   Left Eye Near:    Bilateral Near:         MDM   1. Pustules determined by examination   2. Folliculitis    These pustules are Likely bacterial infected scan. Treat with antibiotics and warm  compresses several times a day. Meds ordered this encounter  Medications  . doxycycline (VIBRAMYCIN) 100 MG capsule    Sig: Take 1 capsule (100 mg total) by mouth 2 (two) times daily.    Dispense:  20 capsule    Refill:  0    Order Specific Question:  Supervising Provider    Answer:  Linna Hoff [[id number]]       Hayden Rasmussen, NP 06/25/15 1418

## 2015-06-25 NOTE — Discharge Instructions (Signed)
Folliculitis These pulse chills are Likely bacterial infected scan. Treat with antibiotics and warm compresses several times a day. Folliculitis is redness, soreness, and swelling (inflammation) of the hair follicles. This condition can occur anywhere on the body. People with weakened immune systems, diabetes, or obesity have a greater risk of getting folliculitis. CAUSES  Bacterial infection. This is the most common cause.  Fungal infection.  Viral infection.  Contact with certain chemicals, especially oils and tars. Long-term folliculitis can result from bacteria that live in the nostrils. The bacteria may trigger multiple outbreaks of folliculitis over time. SYMPTOMS Folliculitis most commonly occurs on the scalp, thighs, legs, back, buttocks, and areas where hair is shaved frequently. An early sign of folliculitis is a small, white or yellow, pus-filled, itchy lesion (pustule). These lesions appear on a red, inflamed follicle. They are usually less than 0.2 inches (5 mm) wide. When there is an infection of the follicle that goes deeper, it becomes a boil or furuncle. A group of closely packed boils creates a larger lesion (carbuncle). Carbuncles tend to occur in hairy, sweaty areas of the body. DIAGNOSIS  Your caregiver can usually tell what is wrong by doing a physical exam. A sample may be taken from one of the lesions and tested in a lab. This can help determine what is causing your folliculitis. TREATMENT  Treatment may include:  Applying warm compresses to the affected areas.  Taking antibiotic medicines orally or applying them to the skin.  Draining the lesions if they contain a large amount of pus or fluid.  Laser hair removal for cases of long-lasting folliculitis. This helps to prevent regrowth of the hair. HOME CARE INSTRUCTIONS  Apply warm compresses to the affected areas as directed by your caregiver.  If antibiotics are prescribed, take them as directed. Finish them  even if you start to feel better.  You may take over-the-counter medicines to relieve itching.  Do not shave irritated skin.  Follow up with your caregiver as directed. SEEK IMMEDIATE MEDICAL CARE IF:   You have increasing redness, swelling, or pain in the affected area.  You have a fever. MAKE SURE YOU:  Understand these instructions.  Will watch your condition.  Will get help right away if you are not doing well or get worse.   This information is not intended to replace advice given to you by your health care provider. Make sure you discuss any questions you have with your health care provider.   Document Released: 04/06/2001 Document Revised: 02/16/2014 Document Reviewed: 04/28/2011 Elsevier Interactive Patient Education [employer]2016 [employer]

## 2015-08-13 ENCOUNTER — Encounter (HOSPITAL_COMMUNITY): Payer: Self-pay | Admitting: Emergency Medicine

## 2015-08-13 ENCOUNTER — Emergency Department (HOSPITAL_COMMUNITY)
Admission: EM | Admit: 2015-08-13 | Discharge: 2015-08-13 | Disposition: A | Discharge status: Home / Self Care | Payer: Self-pay | Attending: Emergency Medicine | Admitting: Emergency Medicine

## 2015-08-13 DIAGNOSIS — I10 Essential (primary) hypertension: Secondary | ICD-10-CM | POA: Insufficient documentation

## 2015-08-13 DIAGNOSIS — R300 Dysuria: Secondary | ICD-10-CM | POA: Insufficient documentation

## 2015-08-13 DIAGNOSIS — Z7982 Long term (current) use of aspirin: Secondary | ICD-10-CM | POA: Insufficient documentation

## 2015-08-13 DIAGNOSIS — Z87891 Personal history of nicotine dependence: Secondary | ICD-10-CM | POA: Insufficient documentation

## 2015-08-13 LAB — URINALYSIS, ROUTINE W REFLEX MICROSCOPIC
Bilirubin Urine: NEGATIVE
GLUCOSE, UA: NEGATIVE mg/dL
Ketones, ur: NEGATIVE mg/dL
LEUKOCYTES UA: NEGATIVE
Nitrite: NEGATIVE
PROTEIN: NEGATIVE mg/dL
Specific Gravity, Urine: 1.023 (ref 1.005–1.030)
pH: 5.5 (ref 5.0–8.0)

## 2015-08-13 LAB — URINE MICROSCOPIC-ADD ON

## 2015-08-13 MED ORDER — STERILE WATER FOR INJECTION IJ SOLN
INTRAMUSCULAR | Status: AC
  Administered 2015-08-13: 2 mL
  Filled 2015-08-13: qty 10

## 2015-08-13 MED ORDER — CEFTRIAXONE SODIUM 250 MG IJ SOLR
250.0000 mg | Freq: Once | INTRAMUSCULAR | Status: AC
  Administered 2015-08-13: 250 mg via INTRAMUSCULAR
  Filled 2015-08-13: qty 250

## 2015-08-13 MED ORDER — AZITHROMYCIN 250 MG PO TABS
1000.0000 mg | ORAL_TABLET | Freq: Once | ORAL | Status: AC
  Administered 2015-08-13: 1000 mg via ORAL
  Filled 2015-08-13: qty 4

## 2015-08-13 NOTE — ED Provider Notes (Signed)
CSN: [id number]     Arrival date & time 08/13/15  [id number] History   First MD Initiated Contact with Patient 08/13/15 0805     Chief Complaint  Patient presents with  . Dysuria     (Consider location/radiation/quality/duration/timing/severity/associated sxs/prior Treatment) Patient is a 55 y.o. male presenting with dysuria. The history is provided by the patient.  Dysuria This is a new problem. The current episode started more than 2 days ago. The problem occurs constantly. The problem has not changed since onset.Pertinent negatives include no chest pain, no abdominal pain, no headaches and no shortness of breath. Nothing aggravates the symptoms. Nothing relieves the symptoms. He has tried nothing for the symptoms. The treatment provided no relief.    55 yo M with a cc of dysuria. Going on for about a week.  Has had a new sexual contact that they just decided to stop using protection about a week ago.  Having pain with urination.  Denies discharge, denies fever, denies rash.  Remote hx of STD.   Past Medical History  Diagnosis Date  . Migraine Dx 1985  . Hypertension Dx 2008    on BP medicine for a brief period   . Arthritis    History reviewed. No pertinent past surgical history. Family History  Problem Relation Age of Onset  . Diabetes Neg Hx   . Heart disease Neg Hx   . Cancer Neg Hx   . Glaucoma Mother    Social History  Substance Use Topics  . Smoking status: Former Smoker -- 0.50 packs/day for 27 years    Types: Cigarettes    Quit date: 07/23/2015  . Smokeless tobacco: Never Used  . Alcohol Use: No     Comment: social    Review of Systems  Constitutional: Negative for fever and chills.  HENT: Negative for congestion and facial swelling.   Eyes: Negative for discharge and visual disturbance.  Respiratory: Negative for shortness of breath.   Cardiovascular: Negative for chest pain and palpitations.  Gastrointestinal: Negative for vomiting, abdominal pain and diarrhea.   Genitourinary: Positive for dysuria and penile pain.  Musculoskeletal: Negative for myalgias and arthralgias.  Skin: Negative for color change and rash.  Neurological: Negative for tremors, syncope and headaches.  Psychiatric/Behavioral: Negative for confusion and dysphoric mood.      Allergies  Review of patient's allergies indicates no known allergies.  Home Medications   Prior to Admission medications   Medication Sig Start Date End Date Taking? Authorizing Provider  aspirin EC 81 MG tablet Take 81 mg by mouth daily as needed (heart health).   Yes Historical Provider, MD  ibuprofen (ADVIL,MOTRIN) 200 MG tablet Take 600 mg by mouth every 6 (six) hours as needed (pain).   Yes Historical Provider, MD  MAGNESIUM PO Take 3-5 tablets by mouth daily as needed (cramps).    Yes Historical Provider, MD  POTASSIUM PO Take 3-5 tablets by mouth daily as needed (cramps).    Yes Historical Provider, MD   BP 142/88 mmHg  Pulse 72  Temp(Src) 98.3 F (36.8 C) (Oral)  Resp 20  Ht 5\' 11"  (1.803 m)  Wt 188 lb (85.276 kg)  BMI 26.23 kg/m2  SpO2 99% Physical Exam  Constitutional: He is oriented to person, place, and time. He appears well-developed and well-nourished.  HENT:  Head: Normocephalic and atraumatic.  Eyes: EOM are normal. Pupils are equal, round, and reactive to light.  Neck: Normal range of motion. Neck supple. No JVD present.  Cardiovascular: Normal  rate and regular rhythm.  Exam reveals no gallop and no friction rub.   No murmur heard. Pulmonary/Chest: No respiratory distress. He has no wheezes.  Abdominal: He exhibits no distension. There is no rebound and no guarding. Hernia confirmed negative in the right inguinal area and confirmed negative in the left inguinal area.  Genitourinary: Testes normal. Cremasteric reflex is present. Right testis shows no swelling and no tenderness. Left testis shows no swelling and no tenderness. Circumcised. No penile tenderness. No discharge  found.  Musculoskeletal: Normal range of motion.  Lymphadenopathy:       Right: No inguinal adenopathy present.       Left: No inguinal adenopathy present.  Neurological: He is alert and oriented to person, place, and time.  Skin: No rash noted. No pallor.  Psychiatric: He has a normal mood and affect. His behavior is normal.  Nursing note and vitals reviewed.   ED Course  Procedures (including critical care time) Labs Review Labs Reviewed  URINALYSIS, ROUTINE W REFLEX MICROSCOPIC (NOT AT [facility])  HIV ANTIBODY (ROUTINE TESTING)  GC/CHLAMYDIA PROBE AMP ([place]) NOT AT [facility]    Imaging Review No results found. I have personally reviewed and evaluated these images and lab results as part of my medical decision-making.   EKG Interpretation None      MDM   Final diagnoses:  Dysuria    55 yo M with dysuria.  With new sexual contact will treat for STD, send for test.  PCP follow up.   8:25 AM:  I have discussed the diagnosis/risks/treatment options with the patient and believe the pt to be eligible for discharge home to follow-up with PCP. We also discussed returning to the ED immediately if new or worsening sx occur. We discussed the sx which are most concerning (e.g., sudden worsening pain, fever, inability to tolerate by mouth) that necessitate immediate return. Medications administered to the patient during their visit and any new prescriptions provided to the patient are listed below.  Medications given during this visit Medications  cefTRIAXone (ROCEPHIN) injection 250 mg (not administered)  azithromycin (ZITHROMAX) tablet 1,000 mg (not administered)    New Prescriptions   No medications on file    The patient appears reasonably screen and/or stabilized for discharge and I doubt any other medical condition or other [facility] requiring further screening, evaluation, or treatment in the ED at this time prior to discharge.      Melene Planan Benjamen Koelling, DO 08/13/15 1138

## 2015-08-13 NOTE — Discharge Instructions (Signed)
No unprotected sex for at least a week. If you are positive for an STD someone should give you a call.   Dysuria Dysuria is pain or discomfort while urinating. The pain or discomfort may be felt in the tube that carries urine out of the bladder (urethra) or in the surrounding tissue of the genitals. The pain may also be felt in the groin area, lower abdomen, and lower back. You may have to urinate frequently or have the sudden feeling that you have to urinate (urgency). Dysuria can affect both men and women, but is more common in women. Dysuria can be caused by many different things, including:  Urinary tract infection in women.  Infection of the kidney or bladder.  Kidney stones or bladder stones.  Certain sexually transmitted infections (STIs), such as chlamydia.  Dehydration.  Inflammation of the vagina.  Use of certain medicines.  Use of certain soaps or scented products that cause irritation. HOME CARE INSTRUCTIONS Watch your dysuria for any changes. The following actions may help to reduce any discomfort you are feeling:  Drink enough fluid to keep your urine clear or pale yellow.  Empty your bladder often. Avoid holding urine for long periods of time.  After a bowel movement or urination, women should cleanse from front to back, using each tissue only once.  Empty your bladder after sexual intercourse.  Take medicines only as directed by your health care provider.  If you were prescribed an antibiotic medicine, finish it all even if you start to feel better.  Avoid caffeine, tea, and alcohol. They can irritate the bladder and make dysuria worse. In men, alcohol may irritate the prostate.  Keep all follow-up visits as directed by your health care provider. This is important.  If you had any tests done to find the cause of dysuria, it is your responsibility to obtain your test results. Ask the lab or department performing the test when and how you will get your results.  Talk with your health care provider if you have any questions about your results. SEEK MEDICAL CARE IF:  You develop pain in your back or sides.  You have a fever.  You have nausea or vomiting.  You have blood in your urine.  You are not urinating as often as you usually do. SEEK IMMEDIATE MEDICAL CARE IF:  You pain is severe and not relieved with medicines.  You are unable to hold down any fluids.  You or someone else notices a change in your mental function.  You have a rapid heartbeat at rest.  You have shaking or chills.  You feel extremely weak.   This information is not intended to replace advice given to you by your health care provider. Make sure you discuss any questions you have with your health care provider.   Document Released: 10/25/2003 Document Revised: 02/16/2014 Document Reviewed: 09/21/2013 Elsevier Interactive Patient Education [employer]2016 [employer] Inc.

## 2015-08-13 NOTE — ED Notes (Signed)
Burning with urination starting on Friday. Denies penile discharge. Denies fever/chills. Denies back pain or lower abdominal pain.

## 2015-08-14 LAB — HIV ANTIBODY (ROUTINE TESTING W REFLEX): HIV Screen 4th Generation wRfx: NONREACTIVE

## 2015-08-14 LAB — GC/CHLAMYDIA PROBE AMP (~~LOC~~) NOT AT ARMC
CHLAMYDIA, DNA PROBE: NEGATIVE
NEISSERIA GONORRHEA: NEGATIVE

## 2017-03-07 ENCOUNTER — Encounter (HOSPITAL_COMMUNITY): Payer: Self-pay | Admitting: *Deleted

## 2017-03-07 ENCOUNTER — Emergency Department (HOSPITAL_COMMUNITY)
Admission: EM | Admit: 2017-03-07 | Discharge: 2017-03-07 | Disposition: A | Discharge status: Home / Self Care | Payer: Self-pay | Attending: Emergency Medicine | Admitting: Emergency Medicine

## 2017-03-07 ENCOUNTER — Other Ambulatory Visit: Payer: Self-pay

## 2017-03-07 DIAGNOSIS — I10 Essential (primary) hypertension: Secondary | ICD-10-CM | POA: Insufficient documentation

## 2017-03-07 DIAGNOSIS — Z7982 Long term (current) use of aspirin: Secondary | ICD-10-CM | POA: Insufficient documentation

## 2017-03-07 DIAGNOSIS — M7989 Other specified soft tissue disorders: Secondary | ICD-10-CM | POA: Insufficient documentation

## 2017-03-07 DIAGNOSIS — Z87891 Personal history of nicotine dependence: Secondary | ICD-10-CM | POA: Insufficient documentation

## 2017-03-07 DIAGNOSIS — M25571 Pain in right ankle and joints of right foot: Secondary | ICD-10-CM | POA: Insufficient documentation

## 2017-03-07 DIAGNOSIS — M25471 Effusion, right ankle: Secondary | ICD-10-CM

## 2017-03-07 MED ORDER — IBUPROFEN 800 MG PO TABS
800.0000 mg | ORAL_TABLET | Freq: Once | ORAL | Status: AC
  Administered 2017-03-07: 800 mg via ORAL
  Filled 2017-03-07: qty 1

## 2017-03-07 NOTE — ED Notes (Signed)
Ortho on the way 

## 2017-03-07 NOTE — ED Provider Notes (Signed)
[facility] EMERGENCY DEPARTMENT Provider Note  CSN: [id number] Arrival date & time: 03/07/17 1147  Chief Complaint(s) Foot Pain  HPI James Robinson is a 57 y.o. male   The history is provided by the patient.  Foot Pain  This is a new problem. Episode onset: 1 week. The problem occurs constantly. The problem has been gradually worsening. Pertinent negatives include no chest pain, no abdominal pain, no headaches and no shortness of breath. Exacerbated by: walking. movement, palpation. The symptoms are relieved by NSAIDs. Treatments tried: 800mg  motrin. The treatment provided significant relief.   No h/o gout, IVDU, trauma.  Works on his feet all day, serving tables.  May have rolled his ankle on a paver, but to fall.    Past Medical History Past Medical History:  Diagnosis Date  . Arthritis   . Hypertension Dx 2008   on BP medicine for a brief period   . Migraine Dx 1985   Patient Active Problem List   Diagnosis Date Noted  . Enlarged prostate on rectal examination 01/01/2014  . Current smoker 01/01/2014  . Rectal bleeding 01/01/2014  . SHOULDER PAIN, BILATERAL 04/13/2008  . KNEE PAIN, RIGHT 04/13/2008  . HEMORRHOIDS, INTERNAL 08/04/2007  . Migraine headache 01/19/2007  . H/O: HTN (hypertension) 01/19/2007   Home Medication(s) Prior to Admission medications   Medication Sig Start Date End Date Taking? Authorizing Provider  aspirin EC 81 MG tablet Take 81 mg by mouth daily as needed (heart health).    [provider]  ibuprofen (ADVIL,MOTRIN) 200 MG tablet Take 600 mg by mouth every 6 (six) hours as needed (pain).    [provider]  MAGNESIUM PO Take 3-5 tablets by mouth daily as needed (cramps).     [provider]  POTASSIUM PO Take 3-5 tablets by mouth daily as needed (cramps).     [provider]  albuterol (PROVENTIL HFA;VENTOLIN HFA) 108 (90 BASE) MCG/ACT inhaler Inhale 2 puffs into the lungs every 6 (six)  hours as needed for wheezing or shortness of breath. 01/28/15 06/09/15  Funches, Gerilyn Nestle, MD  SUMAtriptan (IMITREX) 50 MG tablet Take 1 tablet (50 mg total) by mouth every 2 (two) hours as needed for migraine. May repeat in 2 hours if headache persists or recurs. 01/28/15 06/09/15  Dessa Phi, MD                                                                                                                                    Past Surgical History History reviewed. No pertinent surgical history. Family History Family History  Problem Relation Age of Onset  . Glaucoma Mother   . Diabetes Neg Hx   . Heart disease Neg Hx   . Cancer Neg Hx     Social History Social History   Tobacco Use  . Smoking status: Former Smoker    Packs/day: 0.50    Years: 27.00  Pack years: 13.50    Types: Cigarettes    Last attempt to quit: 07/23/2015    Years since quitting: 1.6  . Smokeless tobacco: Never Used  Substance Use Topics  . Alcohol use: No    Alcohol/week: 0.0 oz    Comment: social  . Drug use: Yes    Types: Marijuana    Comment:  Last use 2014   Allergies Patient has no known allergies.  Review of Systems Review of Systems  Respiratory: Negative for shortness of breath.   Cardiovascular: Negative for chest pain.  Gastrointestinal: Negative for abdominal pain.  Neurological: Negative for headaches.   All other systems are reviewed and are negative for acute change except as noted in the HPI  Physical Exam Vital Signs  I have reviewed the triage vital signs BP (!) 144/67 (BP Location: Right Arm)   Pulse 85   Temp 98.5 F (36.9 C) (Oral)   Resp 16   Ht 5\' 11"  (1.803 m)   Wt 87.5 kg (193 lb)   SpO2 99%   BMI 26.92 kg/m   Physical Exam  Constitutional: He is oriented to person, place, and time. He appears well-developed and well-nourished. No distress.  HENT:  Head: Normocephalic and atraumatic.  Right Ear: External ear normal.  Left Ear: External ear normal.  Nose:  Nose normal.  Mouth/Throat: Mucous membranes are normal. No trismus in the jaw.  Eyes: Conjunctivae and EOM are normal. No scleral icterus.  Neck: Normal range of motion and phonation normal.  Cardiovascular: Normal rate and regular rhythm.  Pulmonary/Chest: Effort normal. No stridor. No respiratory distress.  Abdominal: He exhibits no distension.  Musculoskeletal: Normal range of motion. He exhibits no edema.       Right ankle: He exhibits swelling. He exhibits normal range of motion, no deformity and normal pulse. Tenderness. AITFL tenderness found. No lateral malleolus, no medial malleolus, no head of 5th metatarsal and no proximal fibula tenderness found. Achilles tendon exhibits no pain.  Neurological: He is alert and oriented to person, place, and time.  Skin: He is not diaphoretic.  Psychiatric: He has a normal mood and affect. His behavior is normal.  Vitals reviewed.   ED Results and Treatments Labs (all labs ordered are listed, but only abnormal results are displayed) Labs Reviewed - No data to display                                                                                                                       EKG  EKG Interpretation  Date/Time:    Ventricular Rate:    PR Interval:    QRS Duration:   QT Interval:    QTC Calculation:   R Axis:     Text Interpretation:        Radiology No results found. Pertinent labs & imaging results that were available during my care of the patient were reviewed by me and considered in my medical decision making (see chart for details).  Medications Ordered  in ED Medications  ibuprofen (ADVIL,MOTRIN) tablet 800 mg (800 mg Oral Given 03/07/17 1233)                                                                                                                                    Procedures Procedures  (including critical care time)  Medical Decision Making / ED Course I have reviewed the nursing notes for this encounter  and the patient's prior records (if available in EHR or on provided paperwork).    Right ankle pain and swelling.  Most suspicious for likely sprain.  No history of gout, recent infections, IV drug use.  Low suspicion for septic arthritis.  Patient denied any significant trauma.  He has been ambulating on the leg.  By Lysbeth Pennertowa ankle criteria, no imaging obtained as there is low suspicion for fracture/dislocation.  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  The patient appears reasonably screened and/or stabilized for discharge and I doubt any other medical condition or other [facility] requiring further screening, evaluation, or treatment in the ED at this time prior to discharge.  The patient is safe for discharge with strict return precautions.   Final Clinical Impression(s) / ED Diagnoses Final diagnoses:  Acute right ankle pain  Right ankle swelling    Disposition: Discharge  Condition: Good  I have discussed the results, Dx and Tx plan with the patient who expressed understanding and agree(s) with the plan. Discharge instructions discussed at great length. The patient was given strict return precautions who verbalized understanding of the instructions. No further questions at time of discharge.    ED Discharge Orders    None       Follow Up: Primary care provider  Schedule an appointment as soon as possible for a visit in 2 weeks If symptoms do not improve or  worsen     This chart was dictated using voice recognition software.  Despite best efforts to proofread,  errors can occur which can change the documentation meaning.   Nira Connardama, Torin Whisner Eduardo, MD 03/07/17 1313

## 2017-03-07 NOTE — ED Notes (Signed)
Pt stable, ambulatory, states understanding of discharge instructions 

## 2017-03-07 NOTE — ED Triage Notes (Signed)
Pt reports onset Monday of right foot and ankle pain. Swelling noted to ankle. Pt unsure of any injury. Ambulatory at triage. Also has recent sinus congestion.

## 2017-04-19 ENCOUNTER — Encounter (INDEPENDENT_AMBULATORY_CARE_PROVIDER_SITE_OTHER): Payer: Self-pay | Admitting: Physician Assistant

## 2017-04-19 ENCOUNTER — Ambulatory Visit (INDEPENDENT_AMBULATORY_CARE_PROVIDER_SITE_OTHER): Payer: BLUE CROSS/BLUE SHIELD | Admitting: Physician Assistant

## 2017-04-19 ENCOUNTER — Other Ambulatory Visit: Payer: Self-pay

## 2017-04-19 VITALS — BP 121/71 | HR 66 | Temp 97.5°F | Ht 70.47 in | Wt 216.8 lb

## 2017-04-19 DIAGNOSIS — T485X1A Poisoning by other anti-common-cold drugs, accidental (unintentional), initial encounter: Secondary | ICD-10-CM

## 2017-04-19 DIAGNOSIS — J310 Chronic rhinitis: Secondary | ICD-10-CM

## 2017-04-19 DIAGNOSIS — M25562 Pain in left knee: Secondary | ICD-10-CM

## 2017-04-19 DIAGNOSIS — M25561 Pain in right knee: Secondary | ICD-10-CM | POA: Diagnosis not present

## 2017-04-19 DIAGNOSIS — G8929 Other chronic pain: Secondary | ICD-10-CM | POA: Diagnosis not present

## 2017-04-19 DIAGNOSIS — T485X5A Adverse effect of other anti-common-cold drugs, initial encounter: Principal | ICD-10-CM

## 2017-04-19 MED ORDER — PREDNISONE 10 MG PO TABS
10.0000 mg | ORAL_TABLET | Freq: Every day | ORAL | 0 refills | Status: AC
Start: 2017-04-19 — End: 2017-05-01

## 2017-04-19 MED ORDER — FLUTICASONE PROPIONATE 50 MCG/ACT NA SUSP: 2.0000 | Freq: Every day | NASAL | 6 refills | Status: DC

## 2017-04-19 MED ORDER — ACETAMINOPHEN-CODEINE #3 300-30 MG PO TABS: 1.0000 | ORAL_TABLET | Freq: Every day | ORAL | 0 refills | Status: DC

## 2017-04-19 NOTE — Patient Instructions (Signed)

## 2017-04-19 NOTE — Progress Notes (Signed)
Subjective:  Patient ID: James Draftric L Andis, male    DOB: August 08, 1960  Age: 57 y.o. MRN: [id number]  CC: sinusitis  HPI James Robinson is a 57 y.o. male with a medical history of HTN, Migraine headaches, tobacco use (quit 07/2015), and chronic knee pain presents as a new patient with complaint of chronic and constant sinus congestion since approximately 5 months ago. Has used Oxymetazoline throughout this time. Says his nose is clogged "to the tenth power". Finds only minimal to no relief with OTC sinus medications. Endorses SOB. Does not endorse current fever, chills, nausea, vomiting, CP, palpitations, abdominal pain, rash, or GI/GU sxs.     Patient has chronic R > L knee pain. 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    Outpatient Medications Prior to Visit  Medication Sig Dispense Refill  . ibuprofen (ADVIL,MOTRIN) 200 MG tablet Take 600 mg by mouth every 6 (six) hours as needed (pain).    Marland Kitchen. aspirin EC 81 MG tablet Take 81 mg by mouth daily as needed (heart health).    Marland Kitchen. MAGNESIUM PO Take 3-5 tablets by mouth daily as needed (cramps).     Marland Kitchen. POTASSIUM PO Take 3-5 tablets by mouth daily as needed (cramps).      No facility-administered medications prior to visit.      ROS Review of Systems  Constitutional: Negative for chills, fever and malaise/fatigue.  HENT: Positive for congestion.   Eyes: Negative for blurred vision.  Respiratory: Negative for shortness of breath.   Cardiovascular: Negative for chest pain and palpitations.  Gastrointestinal: Negative for abdominal pain and nausea.  Genitourinary: Negative for dysuria and hematuria.  Musculoskeletal: Negative for joint pain and myalgias.  Skin: Negative for rash.  Neurological: Negative for tingling and headaches.  Psychiatric/Behavioral: Negative for depression. The patient is not nervous/anxious.     Objective:  BP 121/71 (BP Location: Left Arm, Patient Position:  Sitting, Cuff Size: Large)   Pulse 66   Temp (!) 97.5 F (36.4 C) (Oral)   Ht 5' 10.47" (1.79 m)   Wt 216 lb 12.8 oz (98.3 kg)   SpO2 93%   BMI 30.69 kg/m   BP/Weight 04/19/2017 03/07/2017 08/13/2015  Systolic BP 121 138 125  Diastolic BP 71 66 77  Wt. (Lbs) 216.8 193 188  BMI 30.69 26.92 26.23      Physical Exam  Constitutional: He is oriented to person, place, and time.  Well developed, well nourished, NAD, polite  HENT:  Head: Normocephalic and atraumatic.  Turbinates severely hypertrophic and pale bilaterally.   Eyes: No scleral icterus.  Neck: Normal range of motion. Neck supple.  Cardiovascular: Normal rate, regular rhythm and normal heart sounds.  Pulmonary/Chest: Effort normal and breath sounds normal. No respiratory distress. He has no wheezes. He has no rales.  Musculoskeletal: He exhibits no edema.  Mild midline joint tenderness of the right knee. TTP of the right patella. Full aROM.   Lymphadenopathy:    He has no cervical adenopathy.  Neurological: He is alert and oriented to person, place, and time.  Skin: Skin is warm and dry. No rash noted. No erythema. No pallor.  Psychiatric: He has a normal mood and affect. His behavior is normal. Thought content normal.  Vitals reviewed.    Assessment & Plan:    1. Rhinitis medicamentosa - predniSONE (DELTASONE) 10 MG tablet; Take 1 tablet (10 mg total) by mouth daily with breakfast for 12 days. Day 1 take  6 tablets  Day 2 take 6 tablets Day 3 take 5 tablets  Day 4 take 5 tablets   Day 5 take 4 tablets  Day 6 take 4 tablets  Day 7 take 3 tablets   Day 8 take 3 tablets  Day 9 take 2 tablets Day 10 take 2 tablets  Day 11 take 1 tablet   Day 12 take 1 tablet  Dispense: 42 tablet; Refill: 0 - fluticasone (FLONASE) 50 MCG/ACT nasal spray; Place 2 sprays into both nostrils daily.  Dispense: 16 g; Refill: 6  2. Bilateral chronic knee pain - DG Knee Complete 4 Views Right; Future - DG Knee Complete 4 Views Left; Future -  AMB referral to orthopedics   Meds ordered this encounter  Medications  . predniSONE (DELTASONE) 10 MG tablet    Sig: Take 1 tablet (10 mg total) by mouth daily with breakfast for 12 days. Day 1 take 6 tablets  Day 2 take 6 tablets Day 3 take 5 tablets  Day 4 take 5 tablets   Day 5 take 4 tablets  Day 6 take 4 tablets  Day 7 take 3 tablets   Day 8 take 3 tablets  Day 9 take 2 tablets Day 10 take 2 tablets  Day 11 take 1 tablet   Day 12 take 1 tablet    Dispense:  42 tablet    Refill:  0    Order Specific Question:   Supervising Provider    Answer:   Quentin Angst [id number]  . fluticasone (FLONASE) 50 MCG/ACT nasal spray    Sig: Place 2 sprays into both nostrils daily.    Dispense:  16 g    Refill:  6    Order Specific Question:   Supervising Provider    Answer:   Quentin Angst [[id number]]    Follow-up: Return in about 4 weeks (around 05/17/2017) for Sinusiitis and general health maintenance.   Loletta Specter PA

## 2017-05-03 ENCOUNTER — Ambulatory Visit (INDEPENDENT_AMBULATORY_CARE_PROVIDER_SITE_OTHER): Payer: BLUE CROSS/BLUE SHIELD | Admitting: Orthopaedic Surgery

## 2017-05-03 ENCOUNTER — Ambulatory Visit (INDEPENDENT_AMBULATORY_CARE_PROVIDER_SITE_OTHER): Payer: BLUE CROSS/BLUE SHIELD

## 2017-05-03 ENCOUNTER — Encounter (INDEPENDENT_AMBULATORY_CARE_PROVIDER_SITE_OTHER): Payer: Self-pay | Admitting: Orthopaedic Surgery

## 2017-05-03 DIAGNOSIS — M1711 Unilateral primary osteoarthritis, right knee: Secondary | ICD-10-CM | POA: Diagnosis not present

## 2017-05-03 DIAGNOSIS — M1712 Unilateral primary osteoarthritis, left knee: Secondary | ICD-10-CM | POA: Diagnosis not present

## 2017-05-03 DIAGNOSIS — M79641 Pain in right hand: Secondary | ICD-10-CM

## 2017-05-03 MED ORDER — CELECOXIB 200 MG PO CAPS: 200.0000 mg | ORAL_CAPSULE | Freq: Two times a day (BID) | ORAL | 3 refills | Status: DC

## 2017-05-03 MED ORDER — DICLOFENAC SODIUM 1 % TD GEL: 2.0000 g | Freq: Four times a day (QID) | TRANSDERMAL | 5 refills | Status: DC

## 2017-05-03 NOTE — Progress Notes (Signed)
Office Visit Note   Patient: James Robinson           Date of Birth: 1960/03/02           MRN: [id number] Visit Date: 05/03/2017              Requested by: Loletta SpecterGomez, Roger David, PA-C [address] [place], [place] [id number] PCP: Loletta SpecterGomez, Roger David, PA-C   Assessment & Plan: Visit Diagnoses:  1. Primary osteoarthritis of right knee   2. Pain in right hand   3. Unilateral primary osteoarthritis, left knee     Plan: Impression is bilateral knee degenerative joint disease worse in the right and right hand osteoarthritis.  Patient declined cortisone injection for his knees today.  Prescription for Voltaren gel and Celebrex.  Questions encouraged and answered.  Follow-up as needed.  Follow-Up Instructions: Return if symptoms worsen or fail to improve.   Orders:  Orders Placed This Encounter  Procedures  . XR KNEE 3 VIEW LEFT  . XR KNEE 3 VIEW RIGHT  . XR Hand Complete Right   Meds ordered this encounter  Medications  . diclofenac sodium (VOLTAREN) 1 % GEL    Sig: Apply 2 g topically 4 (four) times daily.    Dispense:  1 Tube    Refill:  5  . celecoxib (CELEBREX) 200 MG capsule    Sig: Take 1 capsule (200 mg total) by mouth 2 (two) times daily.    Dispense:  30 capsule    Refill:  3      Procedures: No procedures performed   Clinical Data: No additional findings.   Subjective: Chief Complaint  Patient presents with  . Left Knee - Pain  . Right Knee - Pain  . Right Hand - Pain    Patient is a 57 year old gentleman who comes in with bilateral knee pain worse on the right and right hand pain that is worse with use.  He does use related to his right hand and his right knee that is worse in the morning and triggering or mechanical symptoms.  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  He has not had any injection.  Denies any numbness and tingling or back pain.   Review of Systems  Constitutional: Negative.   All other systems reviewed and are  negative.    Objective: Vital Signs: There were no vitals taken for this visit.  Physical Exam  Constitutional: He is oriented to person, place, and time. He appears well-developed and well-nourished.  HENT:  Head: Normocephalic and atraumatic.  Eyes: Pupils are equal, round, and reactive to light.  Neck: Neck supple.  Pulmonary/Chest: Effort normal.  Abdominal: Soft.  Musculoskeletal: Normal range of motion.  Neurological: He is alert and oriented to person, place, and time.  Skin: Skin is warm.  Psychiatric: He has a normal mood and affect. His behavior is normal. Judgment and thought content normal.  Nursing note and vitals reviewed.   Ortho Exam Right knee exam shows varus alignment.  No joint effusion.  Collaterals and cruciates are grossly intact.  Right hand exam shows no triggering or tenderness with palpation.  He is able to make a full composite fist.  Specialty Comments:  No specialty comments available.  Imaging: Xr Hand Complete Right  Result Date: 05/03/2017 No acute or structural abnormalities  Xr Knee 3 View Left  Result Date: 05/03/2017 Mild osteoarthritis  Xr Knee 3 View Right  Result Date: 05/03/2017 Advanced degenerative  joint disease with varus alignment with severe medial compartment joint space narrowing    PMFS History: Patient Active Problem List   Diagnosis Date Noted  . Enlarged prostate on rectal examination 01/01/2014  . Current smoker 01/01/2014  . Rectal bleeding 01/01/2014  . SHOULDER PAIN, BILATERAL 04/13/2008  . KNEE PAIN, RIGHT 04/13/2008  . HEMORRHOIDS, INTERNAL 08/04/2007  . Migraine headache 01/19/2007  . H/O: HTN (hypertension) 01/19/2007   Past Medical History:  Diagnosis Date  . Arthritis   . Hypertension Dx 2008   on BP medicine for a brief period   . Migraine Dx 1985    Family History  Problem Relation Age of Onset  . Glaucoma Mother   . Diabetes Neg Hx   . Heart disease Neg Hx   . Cancer Neg Hx       History reviewed. No pertinent surgical history. Social History   Occupational History  . Occupation: Waiter   Tobacco Use  . Smoking status: Former Smoker    Packs/day: 0.50    Years: 27.00    Pack years: 13.50    Types: Cigarettes    Last attempt to quit: 07/23/2015    Years since quitting: 1.7  . Smokeless tobacco: Never Used  Substance and Sexual Activity  . Alcohol use: No    Alcohol/week: 0.0 oz    Comment: social  . Drug use: Yes    Types: Marijuana    Comment:  Last use 2014  . Sexual activity: Never    Birth control/protection: Condom

## 2017-05-17 ENCOUNTER — Ambulatory Visit (INDEPENDENT_AMBULATORY_CARE_PROVIDER_SITE_OTHER): Payer: BLUE CROSS/BLUE SHIELD | Admitting: Physician Assistant

## 2017-05-17 ENCOUNTER — Other Ambulatory Visit: Payer: Self-pay

## 2017-05-17 ENCOUNTER — Encounter (INDEPENDENT_AMBULATORY_CARE_PROVIDER_SITE_OTHER): Payer: Self-pay | Admitting: Physician Assistant

## 2017-05-17 VITALS — BP 138/76 | HR 67 | Temp 98.1°F | Ht 71.0 in | Wt 221.8 lb

## 2017-05-17 DIAGNOSIS — J302 Other seasonal allergic rhinitis: Secondary | ICD-10-CM

## 2017-05-17 DIAGNOSIS — T485X5A Adverse effect of other anti-common-cold drugs, initial encounter: Secondary | ICD-10-CM

## 2017-05-17 DIAGNOSIS — J310 Chronic rhinitis: Secondary | ICD-10-CM

## 2017-05-17 DIAGNOSIS — T485X1A Poisoning by other anti-common-cold drugs, accidental (unintentional), initial encounter: Secondary | ICD-10-CM

## 2017-05-17 DIAGNOSIS — N3943 Post-void dribbling: Secondary | ICD-10-CM | POA: Diagnosis not present

## 2017-05-17 DIAGNOSIS — Z1159 Encounter for screening for other viral diseases: Secondary | ICD-10-CM | POA: Diagnosis not present

## 2017-05-17 DIAGNOSIS — Z1211 Encounter for screening for malignant neoplasm of colon: Secondary | ICD-10-CM | POA: Diagnosis not present

## 2017-05-17 DIAGNOSIS — R3915 Urgency of urination: Secondary | ICD-10-CM | POA: Diagnosis not present

## 2017-05-17 LAB — POCT URINALYSIS DIPSTICK
Bilirubin, UA: NEGATIVE
GLUCOSE UA: NEGATIVE
Leukocytes, UA: NEGATIVE
Nitrite, UA: NEGATIVE
Protein, UA: NEGATIVE
Spec Grav, UA: >=1.03 — AB (ref 1.010–1.025)
Urobilinogen, UA: 0.2 E.U./dL
pH, UA: 5.5 (ref 5.0–8.0)

## 2017-05-17 MED ORDER — LEVOCETIRIZINE DIHYDROCHLORIDE 5 MG PO TABS: 5.0000 mg | ORAL_TABLET | Freq: Every evening | ORAL | 3 refills | Status: DC

## 2017-05-17 MED ORDER — NAPROXEN 500 MG PO TABS: 500.0000 mg | ORAL_TABLET | Freq: Two times a day (BID) | ORAL | 1 refills | Status: DC

## 2017-05-17 NOTE — Patient Instructions (Signed)

## 2017-05-17 NOTE — Progress Notes (Signed)
Subjective:  Patient ID: James Robinson, male    DOB: 1961/01/28  Age: 57 y.o. MRN: [id number]  CC: f/u sinusitis  HPI James Robinson is a 57 y.o. male with a medical history of HTN, BPH, Migraine headaches, tobacco use (quit 07/2015), and chronic knee pain presents on f/u of rhinitis medicamentosa. He took a tapered dose of prednisone over 12 days and is using fluticasone nasal spray daily. Says his nasal congestion is mild to moderately better but has noted pollen season is making his congestion worse. Has not taken any antihistamines. No malaise, f/c/n/v, body aches, or sinus pain.      Also complains of urinary urgency and urinary dribbling. Last PSA 3.45 greater than three years ago. Does not take any medication for relief. Does not endorse perineal pain, hematuria, dysuria, LBP, unintentional weight loss, or night sweats.          Outpatient Medications Prior to Visit  Medication Sig Dispense Refill  . acetaminophen-codeine (TYLENOL #3) 300-30 MG tablet Take 1 tablet by mouth daily. 7 tablet 0  . celecoxib (CELEBREX) 200 MG capsule Take 1 capsule (200 mg total) by mouth 2 (two) times daily. 30 capsule 3  . fluticasone (FLONASE) 50 MCG/ACT nasal spray Place 2 sprays into both nostrils daily. 16 g 6  . ibuprofen (ADVIL,MOTRIN) 200 MG tablet Take 600 mg by mouth every 6 (six) hours as needed (pain).    Marland Kitchen. aspirin EC 81 MG tablet Take 81 mg by mouth daily as needed (heart health).    . diclofenac sodium (VOLTAREN) 1 % GEL Apply 2 g topically 4 (four) times daily. (Patient not taking: Reported on 05/17/2017) 1 Tube 5  . MAGNESIUM PO Take 3-5 tablets by mouth daily as needed (cramps).     Marland Kitchen. POTASSIUM PO Take 3-5 tablets by mouth daily as needed (cramps).      No facility-administered medications prior to visit.      ROS Review of Systems  Constitutional: Negative for chills, fever and malaise/fatigue.  HENT: Positive for congestion.   Eyes: Negative for blurred vision.  Respiratory:  Negative for shortness of breath.   Cardiovascular: Negative for chest pain and palpitations.  Gastrointestinal: Negative for abdominal pain and nausea.  Genitourinary: Positive for urgency. Negative for dysuria and hematuria.  Musculoskeletal: Negative for joint pain and myalgias.  Skin: Negative for rash.  Neurological: Negative for tingling and headaches.  Psychiatric/Behavioral: Negative for depression. The patient is not nervous/anxious.     Objective:  BP 138/76 (BP Location: Left Arm, Patient Position: Sitting, Cuff Size: Large)   Pulse 67   Temp 98.1 F (36.7 C) (Oral)   Ht 5\' 11"  (1.803 m)   Wt 221 lb 12.8 oz (100.6 kg)   SpO2 98%   BMI 30.93 kg/m   BP/Weight 05/17/2017 04/19/2017 03/07/2017  Systolic BP 138 121 138  Diastolic BP 76 71 66  Wt. (Lbs) 221.8 216.8 193  BMI 30.93 30.69 26.92      Physical Exam  Constitutional: He is oriented to person, place, and time.  Well developed, well nourished, NAD, polite. Moderate nasal congestion.  HENT:  Head: Normocephalic and atraumatic.  Eyes: No scleral icterus.  Pulmonary/Chest: Effort normal.  Musculoskeletal: He exhibits no edema.  Neurological: He is alert and oriented to person, place, and time.  Skin: Skin is warm and dry. No rash noted. No erythema. No pallor.  Psychiatric: He has a normal mood and affect. His behavior is normal. Thought content normal.  Vitals  reviewed.    Assessment & Plan:   1. Urinary urgency - PSA - Urinalysis Dipstick  2. Urinary dribbling - PSA - Urinalysis Dipstick  3. Need for hepatitis C screening test - Hepatitis c antibody (reflex)  4. Screening for colon cancer - Fecal occult blood, imunochemical  5. Seasonal allergies - Begin levocetirizine (XYZAL) 5 MG tablet; Take 1 tablet (5 mg total) by mouth every evening.  Dispense: 30 tablet; Refill: 3  6. Rhinitis medicamentosa - Continue using Fluticasone nasal spray daily. - Completed Prednisone taper.  Meds ordered  this encounter  Medications  . levocetirizine (XYZAL) 5 MG tablet    Sig: Take 1 tablet (5 mg total) by mouth every evening.    Dispense:  30 tablet    Refill:  3    Order Specific Question:   Supervising Provider    Answer:   Quentin Angst [id number]  . naproxen (NAPROSYN) 500 MG tablet    Sig: Take 1 tablet (500 mg total) by mouth 2 (two) times daily with a meal.    Dispense:  30 tablet    Refill:  1    Order Specific Question:   Supervising Provider    Answer:   Quentin Angst [[id number]]    Follow-up: Return in about 8 weeks (around 07/12/2017) for Seasonal allergies.   Loletta Specter PA

## 2017-05-18 ENCOUNTER — Other Ambulatory Visit (INDEPENDENT_AMBULATORY_CARE_PROVIDER_SITE_OTHER): Payer: Self-pay | Admitting: Physician Assistant

## 2017-05-18 ENCOUNTER — Telehealth (INDEPENDENT_AMBULATORY_CARE_PROVIDER_SITE_OTHER): Payer: Self-pay

## 2017-05-18 DIAGNOSIS — R972 Elevated prostate specific antigen [PSA]: Secondary | ICD-10-CM

## 2017-05-18 LAB — HCV COMMENT:

## 2017-05-18 LAB — PSA: PROSTATE SPECIFIC AG, SERUM: 6.6 ng/mL — AB (ref 0.0–4.0)

## 2017-05-18 LAB — HEPATITIS C ANTIBODY (REFLEX)

## 2017-05-18 NOTE — Telephone Encounter (Signed)
Left patient a message informing of elevated PSA being 6.6 and referral being placed to urology. Hepatitis C negative. Return call to the office with any questions. James Robinson S Ramaya Guile, CMA

## 2017-05-18 NOTE — Telephone Encounter (Signed)
-----   Message from Loletta Specteroger David Gomez, PA-C sent at 05/18/2017  8:30 AM EDT ----- PSA is elevated at 6.6. I am ordering urology referral now. HCV negative.

## 2017-05-31 DIAGNOSIS — Z1211 Encounter for screening for malignant neoplasm of colon: Secondary | ICD-10-CM | POA: Diagnosis not present

## 2017-06-02 ENCOUNTER — Telehealth (INDEPENDENT_AMBULATORY_CARE_PROVIDER_SITE_OTHER): Payer: Self-pay

## 2017-06-02 LAB — FECAL OCCULT BLOOD, IMMUNOCHEMICAL: Fecal Occult Bld: NEGATIVE

## 2017-06-02 NOTE — Telephone Encounter (Signed)
Patient is aware of negative FIT. Iyanna Drummer S Zamoria Boss, CMA  

## 2017-06-02 NOTE — Telephone Encounter (Signed)
-----   Message from Loletta Specteroger David Gomez, PA-C sent at 06/02/2017  4:59 PM EDT ----- Negative FIT.

## 2017-08-27 ENCOUNTER — Ambulatory Visit (INDEPENDENT_AMBULATORY_CARE_PROVIDER_SITE_OTHER): Payer: BLUE CROSS/BLUE SHIELD | Admitting: Physician Assistant

## 2017-09-01 ENCOUNTER — Encounter (HOSPITAL_COMMUNITY): Payer: Self-pay

## 2017-09-01 ENCOUNTER — Emergency Department (HOSPITAL_COMMUNITY)
Admission: EM | Admit: 2017-09-01 | Discharge: 2017-09-01 | Disposition: A | Discharge status: Home / Self Care | Payer: BLUE CROSS/BLUE SHIELD | Attending: Emergency Medicine | Admitting: Emergency Medicine

## 2017-09-01 ENCOUNTER — Other Ambulatory Visit: Payer: Self-pay

## 2017-09-01 DIAGNOSIS — Z5321 Procedure and treatment not carried out due to patient leaving prior to being seen by health care provider: Secondary | ICD-10-CM | POA: Diagnosis not present

## 2017-09-01 DIAGNOSIS — R002 Palpitations: Secondary | ICD-10-CM | POA: Diagnosis not present

## 2017-09-01 DIAGNOSIS — R0602 Shortness of breath: Secondary | ICD-10-CM | POA: Diagnosis not present

## 2017-09-01 LAB — BASIC METABOLIC PANEL
Anion gap: 11 (ref 5–15)
BUN: 13 mg/dL (ref 6–20)
CALCIUM: 8.8 mg/dL — AB (ref 8.9–10.3)
CO2: 23 mmol/L (ref 22–32)
Chloride: 107 mmol/L (ref 98–111)
Creatinine, Ser: 1.02 mg/dL (ref 0.61–1.24)
GFR calc Af Amer: >60 mL/min (ref >60)
GLUCOSE: 96 mg/dL (ref 70–99)
Potassium: 3.3 mmol/L — ABNORMAL LOW (ref 3.5–5.1)
SODIUM: 141 mmol/L (ref 135–145)

## 2017-09-01 LAB — CBC
HCT: 36.3 % — ABNORMAL LOW (ref 39.0–52.0)
HEMOGLOBIN: 12.6 g/dL — AB (ref 13.0–17.0)
MCH: 33.1 pg (ref 26.0–34.0)
MCHC: 34.7 g/dL (ref 30.0–36.0)
MCV: 95.3 fL (ref 78.0–100.0)
Platelets: 272 10*3/uL (ref 150–400)
RBC: 3.81 MIL/uL — ABNORMAL LOW (ref 4.22–5.81)
RDW: 12.6 % (ref 11.5–15.5)
WBC: 8.3 10*3/uL (ref 4.0–10.5)

## 2017-09-01 LAB — I-STAT TROPONIN, ED: TROPONIN I, POC: 0 ng/mL (ref 0.00–0.08)

## 2017-09-01 NOTE — ED Triage Notes (Signed)
Pt reports that around 2pm he felt like his heart was beating fast and irregular. Some SOB, no nausea

## 2017-09-01 NOTE — ED Notes (Addendum)
Pt states he had an emergency at home and will be right back.

## 2017-09-01 NOTE — ED Notes (Signed)
No answer in waiting room.  Pt has not returned.

## 2017-09-01 NOTE — ED Notes (Signed)
Pt has not returned.  No answer on cell phone.

## 2017-09-02 NOTE — ED Notes (Signed)
Follow up call made  No answer  09/02/17  1140  s Dallen Bunte rn

## 2017-10-21 ENCOUNTER — Encounter (INDEPENDENT_AMBULATORY_CARE_PROVIDER_SITE_OTHER): Payer: Self-pay | Admitting: Physician Assistant

## 2017-10-21 ENCOUNTER — Ambulatory Visit (INDEPENDENT_AMBULATORY_CARE_PROVIDER_SITE_OTHER): Payer: BLUE CROSS/BLUE SHIELD | Admitting: Physician Assistant

## 2017-10-21 ENCOUNTER — Other Ambulatory Visit: Payer: Self-pay

## 2017-10-21 VITALS — BP 125/77 | HR 89 | Temp 98.2°F | Ht 71.0 in | Wt 221.0 lb

## 2017-10-21 DIAGNOSIS — R05 Cough: Secondary | ICD-10-CM | POA: Diagnosis not present

## 2017-10-21 DIAGNOSIS — R059 Cough, unspecified: Secondary | ICD-10-CM

## 2017-10-21 DIAGNOSIS — Z87891 Personal history of nicotine dependence: Secondary | ICD-10-CM | POA: Diagnosis not present

## 2017-10-21 DIAGNOSIS — R972 Elevated prostate specific antigen [PSA]: Secondary | ICD-10-CM

## 2017-10-21 DIAGNOSIS — M170 Bilateral primary osteoarthritis of knee: Secondary | ICD-10-CM | POA: Diagnosis not present

## 2017-10-21 MED ORDER — ACETAMINOPHEN-CODEINE #3 300-30 MG PO TABS
1.0000 | ORAL_TABLET | Freq: Three times a day (TID) | ORAL | 0 refills | Status: DC | PRN
Start: 2017-10-21 — End: 2017-12-20

## 2017-10-21 MED ORDER — NAPROXEN 500 MG PO TABS: 500.0000 mg | ORAL_TABLET | Freq: Two times a day (BID) | ORAL | 0 refills | Status: DC

## 2017-10-21 NOTE — Progress Notes (Signed)
Subjective:  Patient ID: James Robinson, male    DOB: Feb 20, 1960  Age: 57 y.o. MRN: [id number]  CC: Pain in knee and cough  HPI James Statzer Winstonis a 57 y.o.malewith a medical history of HTN, BPH, Migraine headaches, tobacco use (quit 07/2015), and chronic knee pain presents chronic R = L knee pain. 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 Recent XR right knee revealed "Advanced degenerative joint disease with varus alignment with severe medial compartment joint space narrowing". Recent left knee XR revealed "mild osteoarthritis". He is apprehensive about having a total knee replacement done. Pain is moderately severe and has been ameliorated with Tylenol #3 and NSAIDs in the past.     Has an elevated PSA and was referred to urologist but pt no showed to his appontment. Thinks he was scared to hear that he may have cancer. Does not endorse unintentional weight loss, night sweats, frank hematuria, or f/c/n/v.       Outpatient Medications Prior to Visit  Medication Sig Dispense Refill  . aspirin EC 81 MG tablet Take 81 mg by mouth daily as needed (heart health).    Marland Kitchen ibuprofen (ADVIL,MOTRIN) 200 MG tablet Take 600 mg by mouth every 6 (six) hours as needed (pain).    Marland Kitchen acetaminophen-codeine (TYLENOL #3) 300-30 MG tablet Take 1 tablet by mouth daily. (Patient not taking: Reported on 10/21/2017) 7 tablet 0  . celecoxib (CELEBREX) 200 MG capsule Take 1 capsule (200 mg total) by mouth 2 (two) times daily. (Patient not taking: Reported on 10/21/2017) 30 capsule 3  . diclofenac sodium (VOLTAREN) 1 % GEL Apply 2 g topically 4 (four) times daily. (Patient not taking: Reported on 05/17/2017) 1 Tube 5  . fluticasone (FLONASE) 50 MCG/ACT nasal spray Place 2 sprays into both nostrils daily. (Patient not taking: Reported on 10/21/2017) 16 g 6  . MAGNESIUM PO Take 3-5 tablets by mouth daily as needed (cramps).     . naproxen (NAPROSYN) 500 MG tablet Take  1 tablet (500 mg total) by mouth 2 (two) times daily with a meal. (Patient not taking: Reported on 10/21/2017) 30 tablet 1  . POTASSIUM PO Take 3-5 tablets by mouth daily as needed (cramps).     Marland Kitchen levocetirizine (XYZAL) 5 MG tablet Take 1 tablet (5 mg total) by mouth every evening. 30 tablet 3   No facility-administered medications prior to visit.      ROS Review of Systems  Constitutional: Negative for chills, fever and malaise/fatigue.  Eyes: Negative for blurred vision.  Respiratory: Positive for cough. Negative for shortness of breath.   Cardiovascular: Negative for chest pain and palpitations.  Gastrointestinal: Negative for abdominal pain and nausea.  Genitourinary: Positive for urgency. Negative for dysuria and hematuria.       Dribbling  Musculoskeletal: Positive for joint pain. Negative for myalgias.  Skin: Negative for rash.  Neurological: Negative for tingling and headaches.  Psychiatric/Behavioral: Negative for depression. The patient is not nervous/anxious.     Objective:  Ht 5\' 11"  (1.803 m)   Wt 221 lb (100.2 kg)   BMI 30.82 kg/m   BP/Weight 10/21/2017 09/01/2017 05/17/2017  Systolic BP - 121 138  Diastolic BP - 77 76  Wt. (Lbs) 221 205 221.8  BMI 30.82 28.59 30.93      Physical Exam  Constitutional: He is oriented to person, place, and time.  Well developed, well nourished, NAD, polite  HENT:  Head: Normocephalic and atraumatic.  Eyes: No  scleral icterus.  Neck: Normal range of motion. Neck supple. No thyromegaly present.  Cardiovascular: Normal rate, regular rhythm and normal heart sounds.  Pulmonary/Chest: Effort normal and breath sounds normal.  Abdominal: Soft. Bowel sounds are normal. There is no tenderness.  Musculoskeletal: He exhibits no edema.  Neurological: He is alert and oriented to person, place, and time.  Skin: Skin is warm and dry. No rash noted. No erythema. No pallor.  Psychiatric: He has a normal mood and affect. His behavior is normal.  Thought content normal.  Vitals reviewed.    Assessment & Plan:    1. Osteoarthritis of both knees, unspecified osteoarthritis type - AMB referral to orthopedics - Begin naproxen (NAPROSYN) 500 MG tablet; Take 1 tablet (500 mg total) by mouth 2 (two) times daily with a meal.  Dispense: 30 tablet; Refill: 0 - Begin acetaminophen-codeine (TYLENOL #3) 300-30 MG tablet; Take 1 tablet by mouth every 8 (eight) hours as needed for moderate pain.  Dispense: 60 tablet; Refill: 0  2. Elevated PSA - Pt no showed to first appointment.  - Ambulatory referral to Urology  3. Cough - Need PFT. Hx of long term tobacco use, Concerning for COPD.  - Ambulatory referral to Pulmonology  4. History of tobacco use - Needs PFT - Ambulatory referral to Pulmonology   Meds ordered this encounter  Medications  . naproxen (NAPROSYN) 500 MG tablet    Sig: Take 1 tablet (500 mg total) by mouth 2 (two) times daily with a meal.    Dispense:  30 tablet    Refill:  0    Order Specific Question:   Supervising Provider    Answer:   Hoy RegisterNEWLIN, ENOBONG [[id number]]  . acetaminophen-codeine (TYLENOL #3) 300-30 MG tablet    Sig: Take 1 tablet by mouth every 8 (eight) hours as needed for moderate pain.    Dispense:  60 tablet    Refill:  0    Order Specific Question:   Supervising Provider    Answer:   Hoy RegisterNEWLIN, ENOBONG [[id number]]    Follow-up: Return in about 8 weeks (around 12/16/2017) for OA, vaccines.   Loletta Specteroger David Gomez PA

## 2017-10-21 NOTE — Patient Instructions (Signed)

## 2017-12-20 ENCOUNTER — Other Ambulatory Visit: Payer: Self-pay

## 2017-12-20 ENCOUNTER — Ambulatory Visit (INDEPENDENT_AMBULATORY_CARE_PROVIDER_SITE_OTHER): Payer: BLUE CROSS/BLUE SHIELD | Admitting: Physician Assistant

## 2017-12-20 ENCOUNTER — Encounter (INDEPENDENT_AMBULATORY_CARE_PROVIDER_SITE_OTHER): Payer: Self-pay | Admitting: Physician Assistant

## 2017-12-20 VITALS — BP 151/76 | HR 79 | Temp 97.9°F | Ht 71.0 in | Wt 220.0 lb

## 2017-12-20 DIAGNOSIS — M170 Bilateral primary osteoarthritis of knee: Secondary | ICD-10-CM | POA: Diagnosis not present

## 2017-12-20 DIAGNOSIS — R972 Elevated prostate specific antigen [PSA]: Secondary | ICD-10-CM

## 2017-12-20 DIAGNOSIS — R030 Elevated blood-pressure reading, without diagnosis of hypertension: Secondary | ICD-10-CM | POA: Diagnosis not present

## 2017-12-20 DIAGNOSIS — Z9119 Patient's noncompliance with other medical treatment and regimen: Secondary | ICD-10-CM | POA: Diagnosis not present

## 2017-12-20 DIAGNOSIS — Z91199 Patient's noncompliance with other medical treatment and regimen due to unspecified reason: Secondary | ICD-10-CM

## 2017-12-20 MED ORDER — MELOXICAM 15 MG PO TABS: 15.0000 mg | ORAL_TABLET | Freq: Every day | ORAL | 0 refills | Status: DC

## 2017-12-20 MED ORDER — ACETAMINOPHEN 500 MG PO TABS: 1000.0000 mg | ORAL_TABLET | Freq: Three times a day (TID) | ORAL | 0 refills | Status: DC | PRN

## 2017-12-20 NOTE — Patient Instructions (Addendum)
Please contact [facility] [address] [place] [phone] for your elevated PSA.         [facility] [phone]  Please answer your phone and reply to your messages so you may address your issues.   Joint Pain Joint pain can be caused by many things. The joint can be bruised, infected, weak from aging, or sore from exercise. The pain will probably go away if you follow your doctor's instructions for home care. If your joint pain continues, more tests may be needed to help find the cause of your condition. Follow these instructions at home: Watch your condition for any changes. Follow these instructions as told to lessen the pain that you are feeling:  Take medicines only as told by your doctor.  Rest the sore joint for as long as told by your doctor. If your doctor tells you to, raise (elevate) the painful joint above the level of your heart while you are sitting or lying down.  Do not do things that cause pain or make the pain worse.  If told, put ice on the painful area: ? Put ice in a plastic bag. ? Place a towel between your skin and the bag. ? Leave the ice on for 20 minutes, 2-3 times per day.  Wear an elastic bandage, splint, or sling as told by your doctor. Loosen the bandage or splint if your fingers or toes lose feeling (become numb) and tingle, or if they turn cold and blue.  Begin exercising or stretching the joint as told by your doctor. Ask your doctor what types of exercise are safe for you.  Keep all follow-up visits as told by your doctor. This is important.  Contact a doctor if:  Your pain gets worse and medicine does not help it.  Your joint pain does not get better in 3 days.  You have more bruising or swelling.  You have a fever.  You lose 10 pounds (4.5 kg) or more without trying. Get help right away if:  You are not able to move the joint.  Your fingers or toes become numb or they turn cold and blue. This information is not intended to  replace advice given to you by your health care provider. Make sure you discuss any questions you have with your health care provider. Document Released: 01/14/2009 Document Revised: 07/04/2015 Document Reviewed: 11/07/2013 Elsevier Interactive Patient Education  [employer].

## 2017-12-20 NOTE — Progress Notes (Signed)
Subjective:  Patient ID: James Robinson, male    DOB: January 14, 1961  Age: 57 y.o. MRN: [id number]  CC: f/u OA  HPI  James Robinson a 57 y.o.malewith a medical history of  HTN,BPH,Migraine headaches, tobacco use (quit 07/2015), and chronic knee pain presents to f/u on bilateral knee pain. 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 Recent XR right knee revealed "Advanced degenerative joint disease with varus alignment with severe medial compartment joint space narrowing". Recent left knee XR revealed "mild osteoarthritis". He is apprehensive about having a total knee replacement done. Pain is moderately severe and has been ameliorated with Tylenol #3 and NSAIDs in the past. Pt has been referred to orthopedics but patient failed to answer calls from orthopedics for scheduling. Furthermore, pt was left a message stating he did not need a new referral since he was previously treated by Dr. Roda Shutters for the same issue. Pt admits he has received the messages but has not called them back. He has also been referred to [facility] for elevated PSA and has received their message but failed to call back to be scheduled for evaluation. Pt is worried and does not know what to expect as a diagnosis. Thinks he has been putting off scheduling with urology and orthopedics because he is afraid of surgery and/or diagnosis of cancer. Pt feels generally ill and has trouble sleeping. Says he has been in a "pity party" for himself. PHQ9 8 and GAD7 8 today. Does not endorse suicidal ideation/intent. Believes his BP is up today due to worry and pain. Does not endorse any other symptoms.      Outpatient Medications Prior to Visit  Medication Sig Dispense Refill  . acetaminophen-codeine (TYLENOL #3) 300-30 MG tablet Take 1 tablet by mouth every 8 (eight) hours as needed for moderate pain. 60 tablet 0  . naproxen (NAPROSYN) 500 MG tablet Take 1 tablet (500 mg total)  by mouth 2 (two) times daily with a meal. 30 tablet 0   No facility-administered medications prior to visit.      ROS Review of Systems  Constitutional: Negative for chills, fever and malaise/fatigue.  Eyes: Negative for blurred vision.  Respiratory: Negative for shortness of breath.   Cardiovascular: Negative for chest pain and palpitations.  Gastrointestinal: Negative for abdominal pain and nausea.  Genitourinary: Negative for dysuria and hematuria.  Musculoskeletal: Negative for joint pain and myalgias.  Skin: Negative for rash.  Neurological: Negative for tingling and headaches.  Psychiatric/Behavioral: Negative for depression. The patient is not nervous/anxious.     Objective:  Ht 5\' 11"  (1.803 m)   Wt 220 lb (99.8 kg)   BMI 30.68 kg/m   Vitals:   12/20/17 0838  BP: (!) 151/76  Pulse: 79  Temp: 97.9 F (36.6 C)  TempSrc: Oral  SpO2: 92%  Weight: 220 lb (99.8 kg)  Height: 5\' 11"  (1.803 m)      Physical Exam  Constitutional: He is oriented to person, place, and time.  Well developed, well nourished, NAD, polite  HENT:  Head: Normocephalic and atraumatic.  Eyes: No scleral icterus.  Neck: Normal range of motion. Neck supple. No thyromegaly present.  Pulmonary/Chest: Effort normal.  Musculoskeletal: He exhibits no edema.  Neurological: He is alert and oriented to person, place, and time.  Mildly antalgic gait  Skin: Skin is warm and dry. No rash noted. No erythema. No pallor.  Psychiatric: His behavior is normal. Thought content normal.  Tearful,  constricted affect.   Vitals reviewed.    Assessment & Plan:   1. Osteoarthritis of both knees, unspecified osteoarthritis type - Pt has received message from orthopedic's office for scheduling but he has not contacted them. I have reassured patient and strongly suggested that he call to make appointment. I have included [facility]'s phone number on his AVS.   2. Elevated PSA - Pt has received message  from urologist's office for scheduling but he has not contacted them. I have reassured patient and strongly suggested that he call to make appointment. I have included [facility]'s phone number on his AVS.   3. Elevated blood pressure reading - Likely multifactorial to include   4. Noncompliance - Pt has purposely ignored his phone calls from orthopedics and urology. Ms. Jenel Lucks has been messaged to reach out to patient to determine if there is a psychological or social aspect to patient failing to contact ortho and urology.     Follow-up: Return in about 12 weeks (around 03/14/2018) for PSA, and bilateral knee pain.   Loletta Specter PA

## 2018-01-03 ENCOUNTER — Telehealth: Payer: Self-pay | Admitting: Licensed Clinical Social Worker

## 2018-01-03 NOTE — Telephone Encounter (Signed)
Call placed to pt. LCSWA introduced self and [facility]. Pt was informed of consult from PCP to address behavioral health and/or psychosocial needs.   Pt agreed to schedule appointment with LCSWA for 01/05/18. No additional concerns noted.

## 2018-01-05 ENCOUNTER — Institutional Professional Consult (permissible substitution): Payer: BLUE CROSS/BLUE SHIELD | Admitting: Licensed Clinical Social Worker

## 2018-01-14 ENCOUNTER — Ambulatory Visit (INDEPENDENT_AMBULATORY_CARE_PROVIDER_SITE_OTHER): Payer: BLUE CROSS/BLUE SHIELD | Admitting: Orthopaedic Surgery

## 2018-03-07 ENCOUNTER — Encounter (HOSPITAL_COMMUNITY): Payer: Self-pay | Admitting: Emergency Medicine

## 2018-03-07 ENCOUNTER — Emergency Department (HOSPITAL_COMMUNITY)
Admission: EM | Admit: 2018-03-07 | Discharge: 2018-03-07 | Disposition: A | Discharge status: Home / Self Care | Payer: BLUE CROSS/BLUE SHIELD | Attending: Emergency Medicine | Admitting: Emergency Medicine

## 2018-03-07 DIAGNOSIS — Z79899 Other long term (current) drug therapy: Secondary | ICD-10-CM | POA: Diagnosis not present

## 2018-03-07 DIAGNOSIS — M25512 Pain in left shoulder: Secondary | ICD-10-CM | POA: Diagnosis not present

## 2018-03-07 DIAGNOSIS — G8929 Other chronic pain: Secondary | ICD-10-CM | POA: Diagnosis not present

## 2018-03-07 DIAGNOSIS — M1712 Unilateral primary osteoarthritis, left knee: Secondary | ICD-10-CM | POA: Insufficient documentation

## 2018-03-07 DIAGNOSIS — I10 Essential (primary) hypertension: Secondary | ICD-10-CM | POA: Insufficient documentation

## 2018-03-07 DIAGNOSIS — M25562 Pain in left knee: Secondary | ICD-10-CM | POA: Diagnosis not present

## 2018-03-07 DIAGNOSIS — Z87891 Personal history of nicotine dependence: Secondary | ICD-10-CM | POA: Diagnosis not present

## 2018-03-07 MED ORDER — DICLOFENAC SODIUM 1 % TD GEL: 2.0000 g | Freq: Four times a day (QID) | TRANSDERMAL | 0 refills | Status: DC

## 2018-03-07 NOTE — ED Triage Notes (Signed)
Pt c/o left shoulder itching, burning, tingling for year that is intermittent.  Pt reports has arthritis and takes generic OTC arthritis pains for joint pain in right knee. Reports last Wed left knee buckled on him and had pains ever since.

## 2018-03-07 NOTE — ED Provider Notes (Signed)
[facility]-EMERGENCY DEPT Provider Note   CSN: [id number] Arrival date & time: 03/07/18  1407     History   Chief Complaint Chief Complaint  Patient presents with  . Shoulder Pain  . Knee Pain    HPI James Robinson is a 58 y.o. male.  58 year old male presents with chronic left knee pain and chronic left shoulder pain.  Patient has been told he has severe arthritis in his knees and needs a knee replacement however he is afraid to have surgery.  Patient denies redness or warmth of the knee, injury or changes in his chronic pain pattern.  Patient also reports left shoulder pain, states that his skin is itchy or burning at times also has pain in the left shoulder, but this is been intermittent for the past year without injury or changes in chronic pain pattern.  No other complaints or concerns.     Past Medical History:  Diagnosis Date  . Arthritis   . Hypertension Dx 2008   on BP medicine for a brief period   . Migraine Dx 1985    Patient Active Problem List   Diagnosis Date Noted  . Enlarged prostate on rectal examination 01/01/2014  . Current smoker 01/01/2014  . Rectal bleeding 01/01/2014  . SHOULDER PAIN, BILATERAL 04/13/2008  . KNEE PAIN, RIGHT 04/13/2008  . HEMORRHOIDS, INTERNAL 08/04/2007  . Migraine headache 01/19/2007  . H/O: HTN (hypertension) 01/19/2007    History reviewed. No pertinent surgical history.      Home Medications    Prior to Admission medications   Medication Sig Start Date End Date Taking? Authorizing Provider  acetaminophen (TYLENOL) 500 MG tablet Take 2 tablets (1,000 mg total) by mouth every 8 (eight) hours as needed. 12/20/17   Loletta Specter, PA-C  diclofenac sodium (VOLTAREN) 1 % GEL Apply 2 g topically 4 (four) times daily. 03/07/18   Jeannie Fend, PA-C  meloxicam (MOBIC) 15 MG tablet Take 1 tablet (15 mg total) by mouth daily. 12/20/17   Loletta Specter, PA-C    Family History Family History    Problem Relation Age of Onset  . Glaucoma Mother   . Diabetes Neg Hx   . Heart disease Neg Hx   . Cancer Neg Hx     Social History Social History   Tobacco Use  . Smoking status: Former Smoker    Packs/day: 0.50    Years: 27.00    Pack years: 13.50    Types: Cigarettes    Last attempt to quit: 07/23/2015    Years since quitting: 2.6  . Smokeless tobacco: Never Used  Substance Use Topics  . Alcohol use: No    Alcohol/week: 0.0 standard drinks    Comment: social  . Drug use: Yes    Types: Marijuana    Comment:  Last use 2014     Allergies   Patient has no known allergies.   Review of Systems Review of Systems  Constitutional: Negative for fever.  Cardiovascular: Negative for chest pain.  Musculoskeletal: Positive for arthralgias, gait problem and myalgias. Negative for joint swelling.  Skin: Negative for color change, pallor, rash and wound.  Allergic/Immunologic: Negative for immunocompromised state.  Neurological: Negative for weakness and numbness.  Hematological: Negative for adenopathy. Does not bruise/bleed easily.     Physical Exam Updated Vital Signs BP (!) 160/72 (BP Location: Right Arm)   Pulse 64   Temp 98.4 F (36.9 C) (Oral)   Resp 18   SpO2  98%   Physical Exam Vitals signs and nursing note reviewed.  Constitutional:      General: He is not in acute distress.    Appearance: He is well-developed. He is not diaphoretic.  HENT:     Head: Normocephalic and atraumatic.  Cardiovascular:     Pulses: Normal pulses.  Pulmonary:     Effort: Pulmonary effort is normal.  Musculoskeletal:        General: Tenderness present. No swelling, deformity or signs of injury.     Left shoulder: He exhibits tenderness and pain. He exhibits normal range of motion, no bony tenderness, no swelling, no effusion and no crepitus.     Left knee: He exhibits no swelling, no effusion, no ecchymosis and no erythema. Tenderness found. Medial joint line tenderness noted.   Skin:    General: Skin is warm and dry.     Findings: No erythema or rash.  Neurological:     Mental Status: He is alert and oriented to person, place, and time.  Psychiatric:        Behavior: Behavior normal.      ED Treatments / Results  Labs (all labs ordered are listed, but only abnormal results are displayed) Labs Reviewed - No data to display  EKG None  Radiology No results found.  Procedures Procedures (including critical care time)  Medications Ordered in ED Medications - No data to display   Initial Impression / Assessment and Plan / ED Course  I have reviewed the triage vital signs and the nursing notes.  Pertinent labs & imaging results that were available during my care of the patient were reviewed by me and considered in my medical decision making (see chart for details).  Clinical Course as of Mar 08 1739  Mon Mar 07, 2018  4443 58 year old male with acute on chronic left shoulder and left knee pain.  Denies changes in his chronic pain pattern.  Patient does not take anything for his pain currently.  Patient was given prescription for Voltaren with plan to follow-up with his orthopedist.  Patient has been afraid to have a cortisone injection in his knee in the past due to fear of needles, also advised he needed a joint replacement bilateral knees however has been afraid to have surgery.  Patient agrees to contact his PCP or see his orthopedist for further treatment.   [LM]    Clinical Course User Index [LM] Jeannie Fend, PA-C   Final Clinical Impressions(s) / ED Diagnoses   Final diagnoses:  Osteoarthritis of left knee, unspecified osteoarthritis type  Chronic left shoulder pain    ED Discharge Orders         Ordered    diclofenac sodium (VOLTAREN) 1 % GEL  4 times daily     03/07/18 1508           Jeannie Fend, PA-C 03/07/18 1741    Pricilla Loveless, MD 03/07/18 [phone]

## 2018-03-07 NOTE — Discharge Instructions (Addendum)
Apply Voltaren gel, 2 g to your left shoulder, 4 g to your left knee as needed as prescribed for pain. Follow-up with your orthopedic doctor as soon as possible.

## 2018-03-16 ENCOUNTER — Ambulatory Visit (INDEPENDENT_AMBULATORY_CARE_PROVIDER_SITE_OTHER): Payer: BLUE CROSS/BLUE SHIELD | Admitting: Orthopaedic Surgery

## 2018-03-16 ENCOUNTER — Ambulatory Visit (INDEPENDENT_AMBULATORY_CARE_PROVIDER_SITE_OTHER): Payer: Self-pay

## 2018-03-16 ENCOUNTER — Encounter (INDEPENDENT_AMBULATORY_CARE_PROVIDER_SITE_OTHER): Payer: Self-pay | Admitting: Orthopaedic Surgery

## 2018-03-16 DIAGNOSIS — M25562 Pain in left knee: Secondary | ICD-10-CM

## 2018-03-16 DIAGNOSIS — G8929 Other chronic pain: Secondary | ICD-10-CM

## 2018-03-16 DIAGNOSIS — M170 Bilateral primary osteoarthritis of knee: Secondary | ICD-10-CM | POA: Diagnosis not present

## 2018-03-16 DIAGNOSIS — M25561 Pain in right knee: Secondary | ICD-10-CM

## 2018-03-16 MED ORDER — LIDOCAINE HCL 1 % IJ SOLN
2.0000 mL | INTRAMUSCULAR | Status: AC | PRN
  Administered 2018-03-16: 2 mL

## 2018-03-16 MED ORDER — METHYLPREDNISOLONE ACETATE 40 MG/ML IJ SUSP
40.0000 mg | INTRAMUSCULAR | Status: AC | PRN
  Administered 2018-03-16: 40 mg via INTRA_ARTICULAR

## 2018-03-16 MED ORDER — BUPIVACAINE HCL 0.5 % IJ SOLN
2.0000 mL | INTRAMUSCULAR | Status: AC | PRN
  Administered 2018-03-16: 2 mL via INTRA_ARTICULAR

## 2018-03-16 NOTE — Progress Notes (Signed)
Office Visit Note   Patient: James Robinson           Date of Birth: 1960-10-12           MRN: [id number] Visit Date: 03/16/2018              Requested by: Loletta Specter, PA-C No address on file PCP: Loletta Specter, PA-C   Assessment & Plan: Visit Diagnoses:  1. Chronic pain of left knee   2. Chronic pain of right knee     Plan: Impression is end-stage right knee degenerative joint disease with fixed varus deformity of 18 degrees and moderate left knee degenerative joint disease.  Both knees were injected today.  Patient tolerates well.  Again we discussed that definitive treatment for the right knee would be a total knee replacement.  Questions encouraged and answered.  Follow-up as needed.  Follow-Up Instructions: Return if symptoms worsen or fail to improve.   Orders:  Orders Placed This Encounter  Procedures  . XR KNEE 3 VIEW RIGHT  . XR KNEE 3 VIEW LEFT   No orders of the defined types were placed in this encounter.     Procedures: Large Joint Inj: bilateral knee on 03/16/2018 3:18 PM Indications: pain Details: 22 G needle  Arthrogram: No  Medications (Right): 2 mL lidocaine 1 %; 2 mL bupivacaine 0.5 %; 40 mg methylPREDNISolone acetate 40 MG/ML Medications (Left): 2 mL lidocaine 1 %; 2 mL bupivacaine 0.5 %; 40 mg methylPREDNISolone acetate 40 MG/ML Outcome: tolerated well, no immediate complications Patient was prepped and draped in the usual sterile fashion.       Clinical Data: No additional findings.   Subjective: Chief Complaint  Patient presents with  . Left Knee - Pain  . Right Knee - Pain    Asar follows up today for bilateral knee pain.  I saw him approximately a year ago for end-stage degenerative joint disease.  He declined injections at that time.  He is interested in receiving some today.  He denies any changes in medical history.   Review of Systems   Objective: Vital Signs: There were no vitals taken for this  visit.  Physical Exam  Ortho Exam Right knee exam shows significant fixed varus deformity.  Overall range of motion is well-preserved.  No joint effusion. Left knee exam shows no joint effusion.  Collaterals and cruciates are stable.  Normal range of motion. Specialty Comments:  No specialty comments available.  Imaging: Xr Knee 3 View Left  Result Date: 03/16/2018 Moderate degenerative joint disease.  Xr Knee 3 View Right  Result Date: 03/16/2018 Varus deformity with end-stage medial compartment degenerative joint disease.   Varus angulation is 18 degrees    PMFS History: Patient Active Problem List   Diagnosis Date Noted  . Enlarged prostate on rectal examination 01/01/2014  . Current smoker 01/01/2014  . Rectal bleeding 01/01/2014  . SHOULDER PAIN, BILATERAL 04/13/2008  . KNEE PAIN, RIGHT 04/13/2008  . HEMORRHOIDS, INTERNAL 08/04/2007  . Migraine headache 01/19/2007  . H/O: HTN (hypertension) 01/19/2007   Past Medical History:  Diagnosis Date  . Arthritis   . Hypertension Dx 2008   on BP medicine for a brief period   . Migraine Dx 1985    Family History  Problem Relation Age of Onset  . Glaucoma Mother   . Diabetes Neg Hx   . Heart disease Neg Hx   . Cancer Neg Hx     No past surgical history on  file. Social History   Occupational History  . Occupation: Waiter   Tobacco Use  . Smoking status: Former Smoker    Packs/day: 0.50    Years: 27.00    Pack years: 13.50    Types: Cigarettes    Last attempt to quit: 07/23/2015    Years since quitting: 2.6  . Smokeless tobacco: Never Used  Substance and Sexual Activity  . Alcohol use: No    Alcohol/week: 0.0 standard drinks    Comment: social  . Drug use: Yes    Types: Marijuana    Comment:  Last use 2014  . Sexual activity: Never    Birth control/protection: Condom

## 2018-04-13 ENCOUNTER — Ambulatory Visit
Admission: EM | Admit: 2018-04-13 | Discharge: 2018-04-13 | Disposition: A | Discharge status: Home / Self Care | Payer: BLUE CROSS/BLUE SHIELD | Attending: Physician Assistant | Admitting: Physician Assistant

## 2018-04-13 DIAGNOSIS — R05 Cough: Secondary | ICD-10-CM | POA: Diagnosis not present

## 2018-04-13 DIAGNOSIS — H9209 Otalgia, unspecified ear: Secondary | ICD-10-CM

## 2018-04-13 DIAGNOSIS — R0981 Nasal congestion: Secondary | ICD-10-CM | POA: Diagnosis not present

## 2018-04-13 DIAGNOSIS — R6889 Other general symptoms and signs: Secondary | ICD-10-CM

## 2018-04-13 DIAGNOSIS — R51 Headache: Secondary | ICD-10-CM | POA: Diagnosis not present

## 2018-04-13 DIAGNOSIS — J3489 Other specified disorders of nose and nasal sinuses: Secondary | ICD-10-CM | POA: Diagnosis not present

## 2018-04-13 DIAGNOSIS — R112 Nausea with vomiting, unspecified: Secondary | ICD-10-CM

## 2018-04-13 MED ORDER — IPRATROPIUM BROMIDE 0.06 % NA SOLN: 2.0000 | Freq: Four times a day (QID) | NASAL | 12 refills | Status: DC

## 2018-04-13 MED ORDER — BENZONATATE 100 MG PO CAPS: 100.0000 mg | ORAL_CAPSULE | Freq: Three times a day (TID) | ORAL | 0 refills | Status: DC

## 2018-04-13 MED ORDER — ONDANSETRON 4 MG PO TBDP: 4.0000 mg | ORAL_TABLET | Freq: Three times a day (TID) | ORAL | 0 refills | Status: DC | PRN

## 2018-04-13 NOTE — ED Provider Notes (Signed)
[facility]    CSN: [id number] Arrival date & time: 04/13/18  1543     History   Chief Complaint Chief Complaint  Patient presents with  . Influenza    HPI James Robinson is a 58 y.o. male.   58 year old male comes in for 3 day history of URI symptom. First started with sore throat/irritation. States yesterday started with worsening symptoms, body aches, sweating, cough, ear pain, headache, rhinorrhea, nasal congestion. Has had nausea with 3 episodes vomiting. States has been able to tolerate fluids since. Some epigastric pain from vomiting. otc cold medicine without relief. Positive sick contact. Former smoker.      Past Medical History:  Diagnosis Date  . Arthritis   . Hypertension Dx 2008   on BP medicine for a brief period   . Migraine Dx 1985    Patient Active Problem List   Diagnosis Date Noted  . Enlarged prostate on rectal examination 01/01/2014  . Current smoker 01/01/2014  . Rectal bleeding 01/01/2014  . SHOULDER PAIN, BILATERAL 04/13/2008  . KNEE PAIN, RIGHT 04/13/2008  . HEMORRHOIDS, INTERNAL 08/04/2007  . Migraine headache 01/19/2007  . H/O: HTN (hypertension) 01/19/2007    History reviewed. No pertinent surgical history.     Home Medications    Prior to Admission medications   Medication Sig Start Date End Date Taking? Authorizing Provider  acetaminophen (TYLENOL) 500 MG tablet Take 2 tablets (1,000 mg total) by mouth every 8 (eight) hours as needed. 12/20/17   Loletta Specter, PA-C  benzonatate (TESSALON) 100 MG capsule Take 1 capsule (100 mg total) by mouth every 8 (eight) hours. 04/13/18   Cathie Hoops, Mandy Fitzwater V, PA-C  diclofenac sodium (VOLTAREN) 1 % GEL Apply 2 g topically 4 (four) times daily. 03/07/18   Army Melia A, PA-C  ipratropium (ATROVENT) 0.06 % nasal spray Place 2 sprays into both nostrils 4 (four) times daily. 04/13/18   Cathie Hoops, Mistina Coatney V, PA-C  meloxicam (MOBIC) 15 MG tablet Take 1 tablet (15 mg total) by mouth daily. 12/20/17   Loletta Specter, PA-C  ondansetron (ZOFRAN ODT) 4 MG disintegrating tablet Take 1 tablet (4 mg total) by mouth every 8 (eight) hours as needed for nausea or vomiting. 04/13/18   Belinda Fisher, PA-C    Family History Family History  Problem Relation Age of Onset  . Glaucoma Mother   . Diabetes Neg Hx   . Heart disease Neg Hx   . Cancer Neg Hx     Social History Social History   Tobacco Use  . Smoking status: Former Smoker    Packs/day: 0.50    Years: 27.00    Pack years: 13.50    Types: Cigarettes    Last attempt to quit: 07/23/2015    Years since quitting: 2.7  . Smokeless tobacco: Never Used  Substance Use Topics  . Alcohol use: No    Alcohol/week: 0.0 standard drinks    Comment: social  . Drug use: Yes    Types: Marijuana    Comment:  Last use 2014     Allergies   Patient has no known allergies.   Review of Systems Review of Systems  Reason unable to perform ROS: See HPI as above.     Physical Exam Triage Vital Signs ED Triage Vitals [04/13/18 1553]  Enc Vitals Group     BP 132/81     Pulse Rate 74     Resp 18     Temp 98.6 F (  37 C)     Temp Source Oral     SpO2 96 %     Weight      Height      Head Circumference      Peak Flow      Pain Score      Pain Loc      Pain Edu?      Excl. in GC?    No data found.  Updated Vital Signs BP 132/81 (BP Location: Left Arm)   Pulse 74   Temp 98.6 F (37 C) (Oral)   Resp 18   SpO2 96%   Physical Exam Constitutional:      General: He is not in acute distress.    Appearance: He is well-developed. He is not ill-appearing, toxic-appearing or diaphoretic.  HENT:     Head: Normocephalic and atraumatic.     Right Ear: Tympanic membrane, ear canal and external ear normal. Tympanic membrane is not erythematous or bulging.     Left Ear: Tympanic membrane, ear canal and external ear normal. Tympanic membrane is not erythematous or bulging.     Nose: Rhinorrhea present.     Right Sinus: No maxillary sinus tenderness  or frontal sinus tenderness.     Left Sinus: No maxillary sinus tenderness or frontal sinus tenderness.     Mouth/Throat:     Mouth: Mucous membranes are moist.     Pharynx: Oropharynx is clear. Uvula midline.  Eyes:     Conjunctiva/sclera: Conjunctivae normal.     Pupils: Pupils are equal, round, and reactive to light.  Neck:     Musculoskeletal: Normal range of motion and neck supple.  Cardiovascular:     Rate and Rhythm: Normal rate and regular rhythm.     Heart sounds: Normal heart sounds. No murmur. No friction rub. No gallop.   Pulmonary:     Effort: Pulmonary effort is normal. No accessory muscle usage, prolonged expiration, respiratory distress or retractions.     Breath sounds: Normal breath sounds. No stridor, decreased air movement or transmitted upper airway sounds. No decreased breath sounds, wheezing, rhonchi or rales.  Skin:    General: Skin is warm and dry.  Neurological:     Mental Status: He is alert and oriented to person, place, and time.      UC Treatments / Results  Labs (all labs ordered are listed, but only abnormal results are displayed) Labs Reviewed - No data to display  EKG None  Radiology No results found.  Procedures Procedures (including critical care time)  Medications Ordered in UC Medications - No data to display  Initial Impression / Assessment and Plan / UC Course  I have reviewed the triage vital signs and the nursing notes.  Pertinent labs & imaging results that were available during my care of the patient were reviewed by me and considered in my medical decision making (see chart for details).    No signs of bacterial infection on exam. Discussed influenza vs other viral illness. Discussed using testing to determine tamiflu use. Patient states would like to defer tamiflu regardless of result, and therefore we deferred flu testing. Will provide symptomatic treatment. Push fluids. Return precautions given. Patient expresses  understanding and agrees to plan.  Final Clinical Impressions(s) / UC Diagnoses   Final diagnoses:  Flu-like symptoms    ED Prescriptions    Medication Sig Dispense Auth. Provider   ondansetron (ZOFRAN ODT) 4 MG disintegrating tablet Take 1 tablet (4 mg total) by mouth  every 8 (eight) hours as needed for nausea or vomiting. 20 tablet Sloane Junkin V, PA-C   benzonatate (TESSALON) 100 MG capsule Take 1 capsule (100 mg total) by mouth every 8 (eight) hours. 21 capsule [place] Fant V, PA-C   ipratropium (ATROVENT) 0.06 % nasal spray Place 2 sprays into both nostrils 4 (four) times daily. 15 mL Threasa Alpha, [place] 04/13/18 1621

## 2018-04-13 NOTE — Discharge Instructions (Signed)
Tessalon for cough. Zofran as needed for nausea/vomiting. Start atrovent nasal spray for nasal congestion/drainage. You can use over the counter nasal saline rinse such as neti pot for nasal congestion. Keep hydrated, your urine should be clear to pale yellow in color. Tylenol/motrin for fever and pain. Monitor for any worsening of symptoms, chest pain, shortness of breath, wheezing, swelling of the throat, follow up for reevaluation.  ° °For sore throat/cough try using a honey-based tea. Use 3 teaspoons of honey with juice squeezed from half lemon. Place shaved pieces of ginger into 1/2-1 cup of water and warm over stove top. Then mix the ingredients and repeat every 4 hours as needed. ° °

## 2018-04-13 NOTE — ED Triage Notes (Signed)
Pt c/o cough, fever, sore throat, earache and body aches since monday

## 2018-04-14 ENCOUNTER — Telehealth: Payer: Self-pay | Admitting: Emergency Medicine

## 2018-04-14 MED ORDER — HYDROCODONE-HOMATROPINE 5-1.5 MG/5ML PO SYRP: 5.0000 mL | ORAL_SOLUTION | Freq: Four times a day (QID) | ORAL | 0 refills | Status: DC | PRN

## 2018-04-14 NOTE — Telephone Encounter (Signed)
Pt called, states he is having difficulty sleeping from the excessive cough.  Spoke to [place] who states okay to send in [place] prescription for patient.  Will send to [facility], verified with patient prior to ending call.  Side effects and precautions reviewed via telephone with patient.

## 2018-08-10 ENCOUNTER — Other Ambulatory Visit: Payer: Self-pay

## 2018-08-10 ENCOUNTER — Ambulatory Visit (INDEPENDENT_AMBULATORY_CARE_PROVIDER_SITE_OTHER): Payer: BC Managed Care – PPO | Admitting: Primary Care

## 2018-08-10 ENCOUNTER — Encounter (INDEPENDENT_AMBULATORY_CARE_PROVIDER_SITE_OTHER): Payer: Self-pay | Admitting: Primary Care

## 2018-08-10 VITALS — BP 168/80 | HR 83 | Temp 98.1°F | Ht 71.0 in | Wt 233.8 lb

## 2018-08-10 DIAGNOSIS — G8929 Other chronic pain: Secondary | ICD-10-CM

## 2018-08-10 DIAGNOSIS — M25561 Pain in right knee: Secondary | ICD-10-CM

## 2018-08-10 DIAGNOSIS — R300 Dysuria: Secondary | ICD-10-CM

## 2018-08-10 DIAGNOSIS — M25562 Pain in left knee: Secondary | ICD-10-CM

## 2018-08-10 DIAGNOSIS — R030 Elevated blood-pressure reading, without diagnosis of hypertension: Secondary | ICD-10-CM | POA: Diagnosis not present

## 2018-08-10 DIAGNOSIS — Z131 Encounter for screening for diabetes mellitus: Secondary | ICD-10-CM

## 2018-08-10 LAB — POCT URINALYSIS DIP (CLINITEK)
Bilirubin, UA: NEGATIVE
Blood, UA: NEGATIVE
Glucose, UA: NEGATIVE mg/dL
Ketones, POC UA: NEGATIVE mg/dL
Leukocytes, UA: NEGATIVE
Nitrite, UA: NEGATIVE
Spec Grav, UA: >=1.03 — AB (ref 1.010–1.025)
Urobilinogen, UA: 0.2 E.U./dL
pH, UA: 7 (ref 5.0–8.0)

## 2018-08-10 LAB — POCT GLYCOSYLATED HEMOGLOBIN (HGB A1C): Hemoglobin A1C: 5.5 % (ref 4.0–5.6)

## 2018-08-10 MED ORDER — DICLOFENAC SODIUM 75 MG PO TBEC: 75.0000 mg | DELAYED_RELEASE_TABLET | Freq: Two times a day (BID) | ORAL | 0 refills | Status: DC

## 2018-08-10 MED ORDER — HYDROCHLOROTHIAZIDE 25 MG PO TABS: 25.0000 mg | ORAL_TABLET | Freq: Every day | ORAL | 0 refills | Status: DC

## 2018-08-10 NOTE — Patient Instructions (Signed)

## 2018-08-10 NOTE — Progress Notes (Signed)
Patient is no longer having burning with urination. He began having it after having biopsy of prostate.

## 2018-08-10 NOTE — Progress Notes (Signed)
Acute Office Visit  Subjective:    Patient ID: James Robinson, male    DOB: 27-May-1960, 58 y.o.   MRN: [id number]  No chief complaint on file.   HPI Patient is in today for complaints of increased urination and burring.,elevated blood pressure on this visit reviewed previously ranged from  130/80-160/92. He does have  shortness of breath on exertion but denies , headaches, chest. pain or lower extremity edema  Current Outpatient Medications on File Prior to Visit  Medication Sig Dispense Refill  . diclofenac sodium (VOLTAREN) 1 % GEL Apply 2 g topically 4 (four) times daily. (Patient not taking: Reported on 08/10/2018) 100 g 0  . meloxicam (MOBIC) 15 MG tablet Take 1 tablet (15 mg total) by mouth daily. (Patient not taking: Reported on 08/10/2018) 30 tablet 0   No current facility-administered medications on file prior to visit.     med  Past Medical History:  Diagnosis Date  . Arthritis   . Hypertension Dx 2008   on BP medicine for a brief period   . Migraine Dx 1985    No past surgical history on file.  Family History  Problem Relation Age of Onset  . Glaucoma Mother   . Diabetes Neg Hx   . Heart disease Neg Hx   . Cancer Neg Hx     Social History   Socioeconomic History  . Marital status: Single    Spouse name: Not on file  . Number of children: 0  . Years of education: 5312   . Highest education level: Not on file  Occupational History  . Occupation: Engineer, maintenance (IT)Waiter   Social Needs  . Financial resource strain: Not on file  . Food insecurity    Worry: Not on file    Inability: Not on file  . Transportation needs    Medical: Not on file    Non-medical: Not on file  Tobacco Use  . Smoking status: Former Smoker    Packs/day: 0.50    Years: 27.00    Pack years: 13.50    Types: Cigarettes    Quit date: 07/23/2015    Years since quitting: 3.0  . Smokeless tobacco: Never Used  Substance and Sexual Activity  . Alcohol use: No    Alcohol/week: 0.0 standard drinks   Comment: social  . Drug use: Yes    Types: Marijuana    Comment:  Last use 2014  . Sexual activity: Never    Birth control/protection: Condom  Lifestyle  . Physical activity    Days per week: Not on file    Minutes per session: Not on file  . Stress: Not on file  Relationships  . Social Musicianconnections    Talks on phone: Not on file    Gets together: Not on file    Attends religious service: Not on file    Active member of club or organization: Not on file    Attends meetings of clubs or organizations: Not on file    Relationship status: Not on file  . Intimate partner violence    Fear of current or ex partner: Not on file    Emotionally abused: Not on file    Physically abused: Not on file    Forced sexual activity: Not on file  Other Topics Concern  . Not on file  Social History Narrative   Lives alone.   Sister lives in town.   Father lives in [place].        Outpatient Medications  Prior to Visit  Medication Sig Dispense Refill  . acetaminophen (TYLENOL) 500 MG tablet Take 2 tablets (1,000 mg total) by mouth every 8 (eight) hours as needed. 84 tablet 0  . benzonatate (TESSALON) 100 MG capsule Take 1 capsule (100 mg total) by mouth every 8 (eight) hours. 21 capsule 0  . diclofenac sodium (VOLTAREN) 1 % GEL Apply 2 g topically 4 (four) times daily. 100 g 0  . HYDROcodone-homatropine (HYCODAN) 5-1.5 MG/5ML syrup Take 5 mLs by mouth every 6 (six) hours as needed for cough. 120 mL 0  . ipratropium (ATROVENT) 0.06 % nasal spray Place 2 sprays into both nostrils 4 (four) times daily. 15 mL 12  . meloxicam (MOBIC) 15 MG tablet Take 1 tablet (15 mg total) by mouth daily. 30 tablet 0  . ondansetron (ZOFRAN ODT) 4 MG disintegrating tablet Take 1 tablet (4 mg total) by mouth every 8 (eight) hours as needed for nausea or vomiting. 20 tablet 0   No facility-administered medications prior to visit.     No Known Allergies  Review of Systems  Genitourinary: Positive for dysuria and  frequency.  All other systems reviewed and are negative.      Objective:    Physical Exam  Constitutional: He is oriented to person, place, and time. He appears well-developed and well-nourished.  HENT:  Head: Normocephalic.  Neck: Neck supple.  Cardiovascular: Normal rate and regular rhythm.  Pulmonary/Chest: Effort normal and breath sounds normal.  Abdominal: Bowel sounds are normal. He exhibits distension.  Musculoskeletal: Normal range of motion.  Neurological: He is oriented to person, place, and time.  Skin: Skin is dry.  Psychiatric: He has a normal mood and affect.    Ht 5\' 11"  (1.803 m)   Wt 233 lb 12.8 oz (106.1 kg)   BMI 32.61 kg/m  Wt Readings from Last 3 Encounters:  08/10/18 233 lb 12.8 oz (106.1 kg)  12/20/17 220 lb (99.8 kg)  10/21/17 221 lb (100.2 kg)    There are no preventive care reminders to display for this patient.  There are no preventive care reminders to display for this patient.   Lab Results  Component Value Date   TSH 2.729 10/08/2008   Lab Results  Component Value Date   WBC 8.3 09/01/2017   HGB 12.6 (L) 09/01/2017   HCT 36.3 (L) 09/01/2017   MCV 95.3 09/01/2017   PLT 272 09/01/2017   Lab Results  Component Value Date   NA 141 09/01/2017   K 3.3 (L) 09/01/2017   CO2 23 09/01/2017   GLUCOSE 96 09/01/2017   BUN 13 09/01/2017   CREATININE 1.02 09/01/2017   BILITOT 0.4 01/01/2014   ALKPHOS 68 01/01/2014   AST 23 01/01/2014   ALT 38 01/01/2014   PROT 7.0 01/01/2014   ALBUMIN 4.0 01/01/2014   CALCIUM 8.8 (L) 09/01/2017   ANIONGAP 11 09/01/2017   Lab Results  Component Value Date   CHOL 126 01/01/2014   Lab Results  Component Value Date   HDL 44 01/01/2014   Lab Results  Component Value Date   LDLCALC 62 01/01/2014   Lab Results  Component Value Date   TRIG 100 01/01/2014   Lab Results  Component Value Date   CHOLHDL 2.9 01/01/2014   No results found for: HGBA1C     Assessment & Plan:   Problem List Items  Addressed This Visit    None    [name] was seen today for burning with urination.  Diagnoses and all  orders for this visit:  Burning with urination -     POCT URINALYSIS DIP (CLINITEK)  Screening for diabetes mellitus -     HgB A1c  Elevated blood pressure reading Counseled on blood pressure goal of less than 130/80, low-sodium, DASH diet, medication compliance, 150 minutes of moderate intensity exercise per week. Discussed medication compliance, adverse effects.  Chronic pain of both knees -     diclofenac (VOLTAREN) 75 MG EC tablet; Take 1 tablet (75 mg total) by mouth 2 (two) times daily.  Other orders -     hydrochlorothiazide (HYDRODIURIL) 25 MG tablet; Take 1 tablet (25 mg total) by mouth daily.     No orders of the defined types were placed in this encounter.    Grayce SessionsMichelle P Alica Shellhammer, NP

## 2018-09-07 ENCOUNTER — Ambulatory Visit (INDEPENDENT_AMBULATORY_CARE_PROVIDER_SITE_OTHER): Payer: BC Managed Care – PPO | Admitting: Primary Care

## 2018-09-14 ENCOUNTER — Ambulatory Visit (INDEPENDENT_AMBULATORY_CARE_PROVIDER_SITE_OTHER): Payer: Self-pay | Admitting: Primary Care

## 2018-09-14 ENCOUNTER — Encounter (INDEPENDENT_AMBULATORY_CARE_PROVIDER_SITE_OTHER): Payer: Self-pay | Admitting: Primary Care

## 2018-09-14 ENCOUNTER — Other Ambulatory Visit: Payer: Self-pay

## 2018-09-14 VITALS — BP 162/80 | HR 72 | Temp 97.7°F | Ht 71.0 in | Wt 234.2 lb

## 2018-09-14 DIAGNOSIS — M1990 Unspecified osteoarthritis, unspecified site: Secondary | ICD-10-CM

## 2018-09-14 DIAGNOSIS — R5383 Other fatigue: Secondary | ICD-10-CM

## 2018-09-14 DIAGNOSIS — I10 Essential (primary) hypertension: Secondary | ICD-10-CM

## 2018-09-14 MED ORDER — AMLODIPINE BESYLATE 10 MG PO TABS: 10.0000 mg | ORAL_TABLET | Freq: Every day | ORAL | 3 refills | Status: DC

## 2018-09-14 MED ORDER — CELECOXIB 50 MG PO CAPS: 50.0000 mg | ORAL_CAPSULE | Freq: Two times a day (BID) | ORAL | 3 refills | Status: DC

## 2018-09-14 NOTE — Progress Notes (Signed)
Established Patient Office Visit  Subjective:  Patient ID: James Robinson, male    DOB: Oct 15, 1960  Age: 58 y.o. MRN: [id number]  CC:  Chief Complaint  Patient presents with  . Follow-up    BP check   . low energy    HPI DAMARIOUS HOLTSCLAW presents for blood pressure check previous visit started on HCTZ. He denies shortness of breath, headaches, chest pain or lower extremity edema. He is complaining about joints aching all over all the time. He does have a diagnosis of arthritis.   Past Medical History:  Diagnosis Date  . Arthritis   . Hypertension Dx 2008   on BP medicine for a brief period   . Migraine Dx 1985    History reviewed. No pertinent surgical history.  Family History  Problem Relation Age of Onset  . Glaucoma Mother   . Diabetes Neg Hx   . Heart disease Neg Hx   . Cancer Neg Hx     Social History   Socioeconomic History  . Marital status: Single    Spouse name: Not on file  . Number of children: 0  . Years of education: 61   . Highest education level: Not on file  Occupational History  . Occupation: Psychologist, counselling  . Financial resource strain: Not on file  . Food insecurity    Worry: Not on file    Inability: Not on file  . Transportation needs    Medical: Not on file    Non-medical: Not on file  Tobacco Use  . Smoking status: Former Smoker    Packs/day: 0.50    Years: 27.00    Pack years: 13.50    Types: Cigarettes    Quit date: 07/23/2015    Years since quitting: 3.1  . Smokeless tobacco: Never Used  Substance and Sexual Activity  . Alcohol use: No    Alcohol/week: 0.0 standard drinks    Comment: social  . Drug use: Yes    Types: Marijuana    Comment:  Last use 2014  . Sexual activity: Never    Birth control/protection: Condom  Lifestyle  . Physical activity    Days per week: Not on file    Minutes per session: Not on file  . Stress: Not on file  Relationships  . Social Herbalist on phone: Not on file    Gets  together: Not on file    Attends religious service: Not on file    Active member of club or organization: Not on file    Attends meetings of clubs or organizations: Not on file    Relationship status: Not on file  . Intimate partner violence    Fear of current or ex partner: Not on file    Emotionally abused: Not on file    Physically abused: Not on file    Forced sexual activity: Not on file  Other Topics Concern  . Not on file  Social History Narrative   Lives alone.   Sister lives in town.   Father lives in [place].        Outpatient Medications Prior to Visit  Medication Sig Dispense Refill  . diclofenac (VOLTAREN) 75 MG EC tablet Take 1 tablet (75 mg total) by mouth 2 (two) times daily. 30 tablet 0  . hydrochlorothiazide (HYDRODIURIL) 25 MG tablet Take 1 tablet (25 mg total) by mouth daily. 90 tablet 0  . diclofenac sodium (VOLTAREN) 1 % GEL Apply  2 g topically 4 (four) times daily. (Patient not taking: Reported on 08/10/2018) 100 g 0  . meloxicam (MOBIC) 15 MG tablet Take 1 tablet (15 mg total) by mouth daily. (Patient not taking: Reported on 08/10/2018) 30 tablet 0   No facility-administered medications prior to visit.     No Known Allergies  ROS Review of Systems  Musculoskeletal: Positive for arthralgias.  All other systems reviewed and are negative.     Objective:    Physical Exam  Constitutional: He is oriented to person, place, and time. He appears well-developed and well-nourished.  Neck: Neck supple.  Cardiovascular: Normal rate and regular rhythm.  Pulmonary/Chest: Effort normal and breath sounds normal.  Abdominal: Soft. Bowel sounds are normal. He exhibits distension.  Musculoskeletal:     Comments: Aches all over   Neurological: He is oriented to person, place, and time.  Skin: Skin is warm.  Psychiatric: He has a normal mood and affect.    BP (!) 162/80 (BP Location: Left Arm, Patient Position: Sitting, Cuff Size: Normal)   Pulse 72   Temp 97.7 F  (36.5 C) (Tympanic)   Ht '5\' 11"'  (1.803 m)   Wt 234 lb 3.2 oz (106.2 kg)   SpO2 94%   BMI 32.66 kg/m  Wt Readings from Last 3 Encounters:  09/14/18 234 lb 3.2 oz (106.2 kg)  08/10/18 233 lb 12.8 oz (106.1 kg)  12/20/17 220 lb (99.8 kg)     Health Maintenance Due  Topic Date Due  . INFLUENZA VACCINE  09/10/2018    There are no preventive care reminders to display for this patient.  Lab Results  Component Value Date   TSH 1.660 09/14/2018   Lab Results  Component Value Date   WBC 6.6 09/14/2018   HGB 12.6 (L) 09/14/2018   HCT 37.5 09/14/2018   MCV 96 09/14/2018   PLT 247 09/14/2018   Lab Results  Component Value Date   NA 141 09/14/2018   K 4.1 09/14/2018   CO2 25 09/14/2018   GLUCOSE 120 (H) 09/14/2018   BUN 17 09/14/2018   CREATININE 0.99 09/14/2018   BILITOT 0.5 09/14/2018   ALKPHOS 89 09/14/2018   AST 21 09/14/2018   ALT 36 09/14/2018   PROT 6.7 09/14/2018   ALBUMIN 4.0 09/14/2018   CALCIUM 9.5 09/14/2018   ANIONGAP 11 09/01/2017   Lab Results  Component Value Date   CHOL 149 09/14/2018   Lab Results  Component Value Date   HDL 39 (L) 09/14/2018   Lab Results  Component Value Date   LDLCALC 74 09/14/2018   Lab Results  Component Value Date   TRIG 182 (H) 09/14/2018   Lab Results  Component Value Date   CHOLHDL 3.8 09/14/2018   Lab Results  Component Value Date   HGBA1C 5.5 08/10/2018      Assessment & Plan:   Problem List Items Addressed This Visit    None    Visit Diagnoses    Essential hypertension    -  Primary   Relevant Medications   amLODipine (NORVASC) 10 MG tablet   Other Relevant Orders   CMP14+EGFR (Completed)   Lipid Panel (Completed)   Fatigue, unspecified type       Relevant Orders   TSH + free T4 (Completed)   CBC with Differential (Completed)   Vitamin B12 (Completed)   Arthritis       Relevant Medications   celecoxib (CELEBREX) 50 MG capsule   Other Relevant Orders   Sedimentation  Rate (Completed)      Cheston was seen today for follow-up and low energy.  Diagnoses and all orders for this visit:  Essential hypertension Remains elevated added Norvasc 56m  Counseled on blood pressure goal of less than 130/80, low-sodium, DASH diet, medication compliance, 150 minutes of moderate intensity exercise per week. Discussed medication compliance, adverse effects. -     CMP14+EGFR -     Lipid Panel  Fatigue, unspecified type Fatigue, unspecified type Pace yourself, Plan your day,Include naps and breaks schedule a relaxing day, get a little exercise,fuel the body, consider complementary therapies, deep breathing, prayer/medication and guided meditation Labs to be checked CBC, TSH/T4, B12 -     TSH + free T4 -     CBC with Differential -     Vitamin B12  Arthritis Invoking the knee is, only real proven treatments are Weight loss, NSIADs like diclofenac and exercise. Well padded shoes help. Ice the knee 2-3 times a day 15-20 mins at a time. -     Sedimentation Rate  Other orders -     amLODipine (NORVASC) 10 MG tablet; Take 1 tablet (10 mg total) by mouth daily.   Meds ordered this encounter  Medications  . amLODipine (NORVASC) 10 MG tablet    Sig: Take 1 tablet (10 mg total) by mouth daily.    Dispense:  90 tablet    Refill:  3  . celecoxib (CELEBREX) 50 MG capsule    Sig: Take 1 capsule (50 mg total) by mouth 2 (two) times daily.    Dispense:  60 capsule    Refill:  3    Follow-up: Return in about 2 weeks (around 09/28/2018) for Bp check added amlodipine 111m.    MiKerin PernaNP

## 2018-09-14 NOTE — Patient Instructions (Signed)
Arthritis Arthritis means joint pain. It can also mean joint disease. A joint is a place where bones come together. There are more than 100 types of arthritis. What are the causes? This condition may be caused by:  Wear and tear of a joint. This is the most common cause.  A lot of acid in the blood, which leads to pain in the joint (gout).  Pain and swelling (inflammation) in a joint.  Infection of a joint.  Injuries in the joint.  A reaction to medicines (allergy). In some cases, the cause may not be known. What are the signs or symptoms? Symptoms of this condition include:  Redness at a joint.  Swelling at a joint.  Stiffness at a joint.  Warmth coming from the joint.  A fever.  A feeling of being sick. How is this treated? This condition may be treated with:  Treating the cause, if it is known.  Rest.  Raising (elevating) the joint.  Putting cold or hot packs on the joint.  Medicines to treat symptoms and reduce pain and swelling.  Shots of medicines (cortisone) into the joint. You may also be told to make changes in your life, such as doing exercises and losing weight. Follow these instructions at home: Medicines  Take over-the-counter and prescription medicines only as told by your doctor.  Do not take aspirin for pain if your doctor says that you may have gout. Activity  Rest your joint if your doctor tells you to.  Avoid activities that make the pain worse.  Exercise your joint regularly as told by your doctor. Try doing exercises like: ? Swimming. ? Water aerobics. ? Biking. ? Walking. Managing pain, stiffness, and swelling      If told, put ice on the affected area. ? Put ice in a plastic bag. ? Place a towel between your skin and the bag. ? Leave the ice on for 20 minutes, 2-3 times per day.  If your joint is swollen, raise (elevate) it above the level of your heart if told by your doctor.  If your joint feels stiff in the morning,  try taking a warm shower.  If told, put heat on the affected area. Do this as often as told by your doctor. Use the heat source that your doctor recommends, such as a moist heat pack or a heating pad. If you have diabetes, do not apply heat without asking your doctor. To apply heat: ? Place a towel between your skin and the heat source. ? Leave the heat on for 20-30 minutes. ? Remove the heat if your skin turns bright red. This is very important if you are unable to feel pain, heat, or cold. You may have a greater risk of getting burned. General instructions  Do not use any products that contain nicotine or tobacco, such as cigarettes, e-cigarettes, and chewing tobacco. If you need help quitting, ask your doctor.  Keep all follow-up visits as told by your doctor. This is important. Contact a doctor if:  The pain gets worse.  You have a fever. Get help right away if:  You have very bad pain in your joint.  You have swelling in your joint.  Your joint is red.  Many joints become painful and swollen.  You have very bad back pain.  Your leg is very weak.  You cannot control your pee (urine) or poop (stool). Summary  Arthritis means joint pain. It can also mean joint disease. A joint is a place   where bones come together.  The most common cause of this condition is wear and tear of a joint.  Symptoms of this condition include redness, swelling, or stiffness of the joint.  This condition is treated with rest, raising the joint, medicines, and putting cold or hot packs on the joint.  Follow your doctor's instructions about medicines, activity, exercises, and other home care treatments. This information is not intended to replace advice given to you by your health care provider. Make sure you discuss any questions you have with your health care provider. Document Released: 04/22/2009 Document Revised: 01/03/2018 Document Reviewed: 01/03/2018 Elsevier Patient Education  2020  [employer]  

## 2018-09-15 LAB — CMP14+EGFR
ALT: 36 IU/L (ref 0–44)
AST: 21 IU/L (ref 0–40)
Albumin/Globulin Ratio: 1.5 (ref 1.2–2.2)
Albumin: 4 g/dL (ref 3.8–4.9)
Alkaline Phosphatase: 89 IU/L (ref 39–117)
BUN/Creatinine Ratio: 17 (ref 9–20)
BUN: 17 mg/dL (ref 6–24)
Bilirubin Total: 0.5 mg/dL (ref 0.0–1.2)
CO2: 25 mmol/L (ref 20–29)
Calcium: 9.5 mg/dL (ref 8.7–10.2)
Chloride: 103 mmol/L (ref 96–106)
Creatinine, Ser: 0.99 mg/dL (ref 0.76–1.27)
GFR calc Af Amer: 97 mL/min/{1.73_m2} (ref >59)
GFR calc non Af Amer: 84 mL/min/{1.73_m2} (ref >59)
Globulin, Total: 2.7 g/dL (ref 1.5–4.5)
Glucose: 120 mg/dL — ABNORMAL HIGH (ref 65–99)
Potassium: 4.1 mmol/L (ref 3.5–5.2)
Sodium: 141 mmol/L (ref 134–144)
Total Protein: 6.7 g/dL (ref 6.0–8.5)

## 2018-09-15 LAB — LIPID PANEL
Chol/HDL Ratio: 3.8 ratio (ref 0.0–5.0)
Cholesterol, Total: 149 mg/dL (ref 100–199)
HDL: 39 mg/dL — ABNORMAL LOW (ref >39)
LDL Calculated: 74 mg/dL (ref 0–99)
Triglycerides: 182 mg/dL — ABNORMAL HIGH (ref 0–149)
VLDL Cholesterol Cal: 36 mg/dL (ref 5–40)

## 2018-09-15 LAB — CBC WITH DIFFERENTIAL/PLATELET
Basophils Absolute: 0 10*3/uL (ref 0.0–0.2)
Basos: 1 %
EOS (ABSOLUTE): 0.2 10*3/uL (ref 0.0–0.4)
Eos: 3 %
Hematocrit: 37.5 % (ref 37.5–51.0)
Hemoglobin: 12.6 g/dL — ABNORMAL LOW (ref 13.0–17.7)
Immature Grans (Abs): 0 10*3/uL (ref 0.0–0.1)
Immature Granulocytes: 0 %
Lymphocytes Absolute: 2.3 10*3/uL (ref 0.7–3.1)
Lymphs: 34 %
MCH: 32.4 pg (ref 26.6–33.0)
MCHC: 33.6 g/dL (ref 31.5–35.7)
MCV: 96 fL (ref 79–97)
Monocytes Absolute: 0.8 10*3/uL (ref 0.1–0.9)
Monocytes: 13 %
Neutrophils Absolute: 3.3 10*3/uL (ref 1.4–7.0)
Neutrophils: 49 %
Platelets: 247 10*3/uL (ref 150–450)
RBC: 3.89 x10E6/uL — ABNORMAL LOW (ref 4.14–5.80)
RDW: 12.8 % (ref 11.6–15.4)
WBC: 6.6 10*3/uL (ref 3.4–10.8)

## 2018-09-15 LAB — VITAMIN B12: Vitamin B-12: 712 pg/mL (ref 232–1245)

## 2018-09-15 LAB — SEDIMENTATION RATE: Sed Rate: 45 mm/hr — ABNORMAL HIGH (ref 0–30)

## 2018-09-15 LAB — TSH+FREE T4
Free T4: 1.09 ng/dL (ref 0.82–1.77)
TSH: 1.66 u[IU]/mL (ref 0.450–4.500)

## 2018-09-17 ENCOUNTER — Other Ambulatory Visit (INDEPENDENT_AMBULATORY_CARE_PROVIDER_SITE_OTHER): Payer: Self-pay | Admitting: Primary Care

## 2018-09-17 MED ORDER — ATORVASTATIN CALCIUM 20 MG PO TABS
20.0000 mg | ORAL_TABLET | Freq: Every day | ORAL | 3 refills | Status: DC
Start: 2018-09-17 — End: 2019-12-14

## 2018-09-20 ENCOUNTER — Telehealth (INDEPENDENT_AMBULATORY_CARE_PROVIDER_SITE_OTHER): Payer: Self-pay

## 2018-09-20 NOTE — Telephone Encounter (Signed)
-----   Message from Kerin Perna, NP sent at 09/17/2018 11:11 AM EDT ----- Error patient is on celebrex and volteran that treats pain for inflammatory process

## 2018-09-20 NOTE — Telephone Encounter (Signed)
Called patient and left detailed voicemail per DPR. Patient aware that labs normal except elevated cholesterol. Advised patient of low cholesterol diet per pcp. Informed patient via voicemail that sed rate is elevated which contributes to pain-inflammatory process. celebrex and voltaren used to treat already. Atorvastatin sent for cholesterol. Return call to [facility] at [phone] with any questions or concerns. Nat Christen, CMA

## 2018-10-10 ENCOUNTER — Other Ambulatory Visit: Payer: Self-pay

## 2018-10-10 DIAGNOSIS — Z20822 Contact with and (suspected) exposure to covid-19: Secondary | ICD-10-CM

## 2018-10-12 LAB — NOVEL CORONAVIRUS, NAA: SARS-CoV-2, NAA: NOT DETECTED

## 2019-01-13 ENCOUNTER — Other Ambulatory Visit: Payer: Self-pay

## 2019-01-13 DIAGNOSIS — Z20822 Contact with and (suspected) exposure to covid-19: Secondary | ICD-10-CM

## 2019-01-16 LAB — NOVEL CORONAVIRUS, NAA: SARS-CoV-2, NAA: NOT DETECTED

## 2019-04-25 ENCOUNTER — Ambulatory Visit (INDEPENDENT_AMBULATORY_CARE_PROVIDER_SITE_OTHER): Payer: Self-pay | Admitting: Primary Care

## 2019-08-28 ENCOUNTER — Encounter (INDEPENDENT_AMBULATORY_CARE_PROVIDER_SITE_OTHER): Payer: Self-pay | Admitting: Primary Care

## 2019-09-04 ENCOUNTER — Encounter (INDEPENDENT_AMBULATORY_CARE_PROVIDER_SITE_OTHER): Payer: Self-pay | Admitting: Primary Care

## 2019-09-11 ENCOUNTER — Encounter (INDEPENDENT_AMBULATORY_CARE_PROVIDER_SITE_OTHER): Payer: Self-pay | Admitting: Primary Care

## 2019-09-11 ENCOUNTER — Ambulatory Visit (INDEPENDENT_AMBULATORY_CARE_PROVIDER_SITE_OTHER): Payer: 59 | Admitting: Primary Care

## 2019-09-11 ENCOUNTER — Other Ambulatory Visit: Payer: Self-pay

## 2019-09-11 VITALS — BP 144/83 | HR 82 | Temp 98.5°F | Ht 71.0 in | Wt 241.2 lb

## 2019-09-11 DIAGNOSIS — E782 Mixed hyperlipidemia: Secondary | ICD-10-CM

## 2019-09-11 DIAGNOSIS — Z23 Encounter for immunization: Secondary | ICD-10-CM

## 2019-09-11 DIAGNOSIS — R5383 Other fatigue: Secondary | ICD-10-CM

## 2019-09-11 DIAGNOSIS — Z131 Encounter for screening for diabetes mellitus: Secondary | ICD-10-CM

## 2019-09-11 DIAGNOSIS — Z0000 Encounter for general adult medical examination without abnormal findings: Secondary | ICD-10-CM

## 2019-09-11 DIAGNOSIS — I10 Essential (primary) hypertension: Secondary | ICD-10-CM

## 2019-09-11 LAB — POCT GLYCOSYLATED HEMOGLOBIN (HGB A1C): Hemoglobin A1C: 5.3 % (ref 4.0–5.6)

## 2019-09-11 MED ORDER — AMLODIPINE BESYLATE 10 MG PO TABS: 10.0000 mg | ORAL_TABLET | Freq: Every day | ORAL | 3 refills | Status: DC

## 2019-09-11 MED ORDER — HYDROCHLOROTHIAZIDE 25 MG PO TABS: 25.0000 mg | ORAL_TABLET | Freq: Every day | ORAL | 0 refills | Status: DC

## 2019-09-11 NOTE — Progress Notes (Signed)
Wants to know if he needs a B12 injection for energy

## 2019-09-11 NOTE — Patient Instructions (Addendum)
Hypertension, Adult High blood pressure (hypertension) is when the force of blood pumping through the arteries is too strong. The arteries are the blood vessels that carry blood from the heart throughout the body. Hypertension forces the heart to work harder to pump blood and may cause arteries to become narrow or stiff. Untreated or uncontrolled hypertension can cause a heart attack, heart failure, a stroke, kidney disease, and other problems. A blood pressure reading consists of a higher number over a lower number. Ideally, your blood pressure should be below 120/80. The first ("top") number is called the systolic pressure. It is a measure of the pressure in your arteries as your heart beats. The second ("bottom") number is called the diastolic pressure. It is a measure of the pressure in your arteries as the heart relaxes. What are the causes? The exact cause of this condition is not known. There are some conditions that result in or are related to high blood pressure. What increases the risk? Some risk factors for high blood pressure are under your control. The following factors may make you more likely to develop this condition:  Smoking.  Having type 2 diabetes mellitus, high cholesterol, or both.  Not getting enough exercise or physical activity.  Being overweight.  Having too much fat, sugar, calories, or salt (sodium) in your diet.  Drinking too much alcohol. Some risk factors for high blood pressure may be difficult or impossible to change. Some of these factors include:  Having chronic kidney disease.  Having a family history of high blood pressure.  Age. Risk increases with age.  Race. You may be at higher risk if you are African American.  Gender. Men are at higher risk than women before age 45. After age 65, women are at higher risk than men.  Having obstructive sleep apnea.  Stress. What are the signs or symptoms? High blood pressure may not cause symptoms. Very high  blood pressure (hypertensive crisis) may cause:  Headache.  Anxiety.  Shortness of breath.  Nosebleed.  Nausea and vomiting.  Vision changes.  Severe chest pain.  Seizures. How is this diagnosed? This condition is diagnosed by measuring your blood pressure while you are seated, with your arm resting on a flat surface, your legs uncrossed, and your feet flat on the floor. The cuff of the blood pressure monitor will be placed directly against the skin of your upper arm at the level of your heart. It should be measured at least twice using the same arm. Certain conditions can cause a difference in blood pressure between your right and left arms. Certain factors can cause blood pressure readings to be lower or higher than normal for a short period of time:  When your blood pressure is higher when you are in a health care provider's office than when you are at home, this is called white coat hypertension. Most people with this condition do not need medicines.  When your blood pressure is higher at home than when you are in a health care provider's office, this is called masked hypertension. Most people with this condition may need medicines to control blood pressure. If you have a high blood pressure reading during one visit or you have normal blood pressure with other risk factors, you may be asked to:  Return on a different day to have your blood pressure checked again.  Monitor your blood pressure at home for 1 week or longer. If you are diagnosed with hypertension, you may have other blood or   imaging tests to help your health care provider understand your overall risk for other conditions. How is this treated? This condition is treated by making healthy lifestyle changes, such as eating healthy foods, exercising more, and reducing your alcohol intake. Your health care provider may prescribe medicine if lifestyle changes are not enough to get your blood pressure under control, and  if:  Your systolic blood pressure is above 130.  Your diastolic blood pressure is above 80. Your personal target blood pressure may vary depending on your medical conditions, your age, and other factors. Follow these instructions at home: Eating and drinking   Eat a diet that is high in fiber and potassium, and low in sodium, added sugar, and fat. An example eating plan is called the DASH (Dietary Approaches to Stop Hypertension) diet. To eat this way: ? Eat plenty of fresh fruits and vegetables. Try to fill one half of your plate at each meal with fruits and vegetables. ? Eat whole grains, such as whole-wheat pasta, brown rice, or whole-grain bread. Fill about one fourth of your plate with whole grains. ? Eat or drink low-fat dairy products, such as skim milk or low-fat yogurt. ? Avoid fatty cuts of meat, processed or cured meats, and poultry with skin. Fill about one fourth of your plate with lean proteins, such as fish, chicken without skin, beans, eggs, or tofu. ? Avoid pre-made and processed foods. These tend to be higher in sodium, added sugar, and fat.  Reduce your daily sodium intake. Most people with hypertension should eat less than 1,500 mg of sodium a day.  Do not drink alcohol if: ? Your health care provider tells you not to drink. ? You are pregnant, may be pregnant, or are planning to become pregnant.  If you drink alcohol: ? Limit how much you use to:  0-1 drink a day for women.  0-2 drinks a day for men. ? Be aware of how much alcohol is in your drink. In the [place] one drink equals one 12 oz bottle of beer (355 mL), one 5 oz glass of wine (148 mL), or one 1 oz glass of hard liquor (44 mL). Lifestyle   Work with your health care provider to maintain a healthy body weight or to lose weight. Ask what an ideal weight is for you.  Get at least 30 minutes of exercise most days of the week. Activities may include walking, swimming, or biking.  Include exercise to  strengthen your muscles (resistance exercise), such as Pilates or lifting weights, as part of your weekly exercise routine. Try to do these types of exercises for 30 minutes at least 3 days a week.  Do not use any products that contain nicotine or tobacco, such as cigarettes, e-cigarettes, and chewing tobacco. If you need help quitting, ask your health care provider.  Monitor your blood pressure at home as told by your health care provider.  Keep all follow-up visits as told by your health care provider. This is important. Medicines  Take over-the-counter and prescription medicines only as told by your health care provider. Follow directions carefully. Blood pressure medicines must be taken as prescribed.  Do not skip doses of blood pressure medicine. Doing this puts you at risk for problems and can make the medicine less effective.  Ask your health care provider about side effects or reactions to medicines that you should watch for. Contact a health care provider if you:  Think you are having a reaction to a medicine you   are taking.  Have headaches that keep coming back (recurring).  Feel dizzy.  Have swelling in your ankles.  Have trouble with your vision. Get help right away if you:  Develop a severe headache or confusion.  Have unusual weakness or numbness.  Feel faint.  Have severe pain in your chest or abdomen.  Vomit repeatedly.  Have trouble breathing. Summary  Hypertension is when the force of blood pumping through your arteries is too strong. If this condition is not controlled, it may put you at risk for serious complications.  Your personal target blood pressure may vary depending on your medical conditions, your age, and other factors. For most people, a normal blood pressure is less than 120/80.  Hypertension is treated with lifestyle changes, medicines, or a combination of both. Lifestyle changes include losing weight, eating a healthy, low-sodium diet,  exercising more, and limiting alcohol. This information is not intended to replace advice given to you by your health care provider. Make sure you discuss any questions you have with your health care provider. Document Revised: 10/06/2017 Document Reviewed: 10/06/2017 Elsevier Patient Education  2020 [employer]. https://www.cdc.gov/vaccines/hcp/vis/vis-statements/tdap.pdf">  Tdap (Tetanus, Diphtheria, Pertussis) Vaccine: What You Need to Know 1. Why get vaccinated? Tdap vaccine can prevent tetanus, diphtheria, and pertussis. Diphtheria and pertussis spread from person to person. Tetanus enters the body through cuts or wounds.  TETANUS (T) causes painful stiffening of the muscles. Tetanus can lead to serious health problems, including being unable to open the mouth, having trouble swallowing and breathing, or death.  DIPHTHERIA (D) can lead to difficulty breathing, heart failure, paralysis, or death.  PERTUSSIS (aP), also known as "whooping cough," can cause uncontrollable, violent coughing which makes it hard to breathe, eat, or drink. Pertussis can be extremely serious in babies and young children, causing pneumonia, convulsions, brain damage, or death. In teens and adults, it can cause weight loss, loss of bladder control, passing out, and rib fractures from severe coughing. 2. Tdap vaccine Tdap is only for children 7 years and older, adolescents, and adults.  Adolescents should receive a single dose of Tdap, preferably at age 42 or 12 years. Pregnant women should get a dose of Tdap during every pregnancy, to protect the newborn from pertussis. Infants are most at risk for severe, life-threatening complications from pertussis. Adults who have never received Tdap should get a dose of Tdap. Also, adults should receive a booster dose every 10 years, or earlier in the case of a severe and dirty wound or burn. Booster doses can be either Tdap or Td (a different vaccine that protects against  tetanus and diphtheria but not pertussis). Tdap may be given at the same time as other vaccines. 3. Talk with your health care provider Tell your vaccine provider if the person getting the vaccine:  Has had an allergic reaction after a previous dose of any vaccine that protects against tetanus, diphtheria, or pertussis, or has any severe, life-threatening allergies.  Has had a coma, decreased level of consciousness, or prolonged seizures within 7 days after a previous dose of any pertussis vaccine (DTP, DTaP, or Tdap).  Has seizures or another nervous system problem.  Has ever had Guillain-Barr Syndrome (also called GBS).  Has had severe pain or swelling after a previous dose of any vaccine that protects against tetanus or diphtheria. In some cases, your health care provider may decide to postpone Tdap vaccination to a future visit.  People with minor illnesses, such as a cold, may be vaccinated. People  who are moderately or severely ill should usually wait until they recover before getting Tdap vaccine.  Your health care provider can give you more information. 4. Risks of a vaccine reaction  Pain, redness, or swelling where the shot was given, mild fever, headache, feeling tired, and nausea, vomiting, diarrhea, or stomachache sometimes happen after Tdap vaccine. People sometimes faint after medical procedures, including vaccination. Tell your provider if you feel dizzy or have vision changes or ringing in the ears.  As with any medicine, there is a very remote chance of a vaccine causing a severe allergic reaction, other serious injury, or death. 5. What if there is a serious problem? An allergic reaction could occur after the vaccinated person leaves the clinic. If you see signs of a severe allergic reaction (hives, swelling of the face and throat, difficulty breathing, a fast heartbeat, dizziness, or weakness), call 9-1-1 and get the person to the nearest hospital. For other signs that  concern you, call your health care provider.  Adverse reactions should be reported to the Vaccine Adverse Event Reporting System (VAERS). Your health care provider will usually file this report, or you can do it yourself. Visit the VAERS website at www.vaers.LAgents.no or call 734-406-7208. VAERS is only for reporting reactions, and VAERS staff do not give medical advice. 6. The National Vaccine Injury Compensation Program The [employer] Vaccine Injury Compensation Program (VICP) is a federal program that was created to compensate people who may have been injured by certain vaccines. Visit the VICP website at SpiritualWord.at or call 3312974590 to learn about the program and about filing a claim. There is a time limit to file a claim for compensation. 7. How can I learn more?  Ask your health care provider.  Call your local or state health department.  Contact the Centers for Disease Control and Prevention (CDC): ? Call 3208879593 (1-800-CDC-INFO) or ? Visit CDC's website at PicCapture.uy Vaccine Information Statement Tdap (Tetanus, Diphtheria, Pertussis) Vaccine (05/11/2018) This information is not intended to replace advice given to you by your health care provider. Make sure you discuss any questions you have with your health care provider. Document Revised: 05/20/2018 Document Reviewed: 05/23/2018 Elsevier Patient Education  2020 [employer].

## 2019-09-11 NOTE — Progress Notes (Signed)
Mr.James Robinson is a 59 y.o. male presents to office today for annual physical exam examination.    Concerns today include: 1. None  Occupation: Ambulance person, Marital status: no, Substance use: no Diet: Mostly healthy , Exercise: 4 times a week Last eye exam: unknown Last dental exam: 2017 Last colonoscopy: unknown  Refills needed today: yes Immunizations needed: Flu Vaccine: yes  Tdap Vaccine: yes  - every 67yr - (<3 lifetime doses or unknown): all wounds -- look up need for Tetanus IG - (>=3 lifetime doses): clean/minor wound if >160yrfrom previous; all other wounds if >5y44yrrom previous Zoster Vaccine: yes (those >50yo, once) Pneumonia Vaccine: no (those w/ risk factors) - (<65y76yrth: Immunocompromised, cochlear implant, CSF leak, asplenic, sickle cell, Chronic Renal Failure - (<88yr3yrV-23 only: Heart dz, lung disease, DM, tobacco abuse, alcoholism, cirrhosis/liver disease. - (>88yr)54yrV13 then PPSV23 in 6-12mths;  - (>88yr):79yrat PPSV23 once if pt received prior to 59yo and 28yrs ha7yrassed  Past Medical History:  Diagnosis Date  . Arthritis   . Hypertension Dx 2008   on BP medicine for a brief period   . Migraine Dx 1985   Social History   Socioeconomic History  . Marital status: Single    Spouse name: Not on file  . Number of children: 0  . Years of education: 12   . H8hest education level: Not on file  Occupational History  . Occupation: Waiter   Tobacco Use  . Smoking status: Former Smoker    Packs/day: 0.50    Years: 27.00    Pack years: 13.50    Types: Cigarettes    Quit date: 07/23/2015    Years since quitting: 4.1  . Smokeless tobacco: Never Used  Vaping Use  . Vaping Use: Never used  Substance and Sexual Activity  . Alcohol use: No    Alcohol/week: 0.0 standard drinks    Comment: social  . Drug use: Yes    Types: Marijuana    Comment:  Last use 2014  . Sexual activity: Never    Birth control/protection: Condom  Other Topics  Concern  . Not on file  Social History Narrative   Lives alone.   Sister lives in town.   Father [place]   Financial Resource Strain:   . Difficulty of Paying Living Expenses:   Food Insecurity:   . Worried About Running Charity fundraiserLast Year:   . Ran Out ArboriculturistLast Year:   Transportation Needs:   . Lack of Film/video editorl):   . Lack oMarland Kitchen Transportation (Non-Medical):   Physical Activity:   . Days of Exercise per Week:   . Minutes of Exercise per Session:   Stress:   . Feeling of Stress :   Social Connections:   . Frequency of Communication with Friends and Family:   . Frequency of Social Gatherings with Friends and Family:   . Attends Religious Services:   . Active Member of Clubs or Organizations:   . Attends Club or Archivists:   . MaritaMarland Kitchen Status:   Intimate Partner Violence:   . Fear of Current or Ex-Partner:   . Emotionally Abused:   . PhysicMarland Kitchenlly Abused:   . Sexually Abused:    No past surgical history on file. Family History  Problem Relation Age of Onset  . Glaucoma Mother   . Diabetes Neg Hx   .  Heart disease Neg Hx   . Cancer Neg Hx     Current Outpatient Medications:  .  amLODipine (NORVASC) 10 MG tablet, Take 1 tablet (10 mg total) by mouth daily. (Patient not taking: Reported on 09/11/2019), Disp: 90 tablet, Rfl: 3 .  atorvastatin (LIPITOR) 20 MG tablet, Take 1 tablet (20 mg total) by mouth daily. (Patient not taking: Reported on 09/11/2019), Disp: 90 tablet, Rfl: 3 .  celecoxib (CELEBREX) 50 MG capsule, Take 1 capsule (50 mg total) by mouth 2 (two) times daily. (Patient not taking: Reported on 09/11/2019), Disp: 60 capsule, Rfl: 3 .  diclofenac (VOLTAREN) 75 MG EC tablet, Take 1 tablet (75 mg total) by mouth 2 (two) times daily. (Patient not taking: Reported on 09/11/2019), Disp: 30 tablet, Rfl: 0 .  hydrochlorothiazide (HYDRODIURIL) 25 MG tablet, Take 1 tablet (25 mg total) by mouth  daily. (Patient not taking: Reported on 09/11/2019), Disp: 90 tablet, Rfl: 0  No Known Allergies   ROS: Review of Systems A comprehensive review of systems was negative.    Physical exam Vitals:   09/11/19 1501  BP: (!) 144/83  Pulse: 82  Temp: 98.5 F (36.9 C)  TempSrc: Oral  SpO2: 95%  Weight: 241 lb 3.2 oz (109.4 kg)  Height: _0  (1.803 m)   General: Vital signs reviewed.  Patient is well-developed obese male and well-nourished, in no acute distress and cooperative with exam.  Head: Normocephalic and atraumatic. Eyes: EOMI, conjunctivae normal, no scleral icterus.  Neck: Supple, trachea midline, normal ROM, no JVD, masses, thyromegaly, or carotid bruit present.  Cardiovascular: RRR, S1 normal, S2 normal, no murmurs, gallops, or rubs. Pulmonary/Chest: Clear to auscultation bilaterally, no wheezes, rales, or rhonchi. Abdominal: Soft, non-tender, non-distended, BS +, no masses, organomegaly, or guarding present.  Musculoskeletal: No joint deformities, erythema, or stiffness, ROM full and nontender. Extremities: No lower extremity edema bilaterally,  pulses symmetric and intact bilaterally. No cyanosis or clubbing. Neurological: A&O x3, Strength is normal and symmetric bilaterally, cranial nerve II-XII are grossly intact, no focal motor deficit, sensory intact to light touch bilaterally.  Skin: Warm, dry and intact. No rashes or erythema. Psychiatric: Normal mood and affect. speech and behavior is normal. Cognition and memory are normal.  Assessment/ Plan: James Robinson here for annual physical exam.  James Robinson was seen today for annual exam.  Diagnoses and all orders for this visit:  Screening for diabetes mellitus -     HgB A1c  Need for Tdap vaccination Health maintenance and care gaps -     Tdap vaccine greater than or equal to 7yo IM  Essential hypertension Blood pressure today uncontrolled 144/83 he was out blood pressure medication prescribed amlodipine 10 mg and  hydrochlorothiazide 25 mg take daily -return for blood work    CMP14+EGFR; Future -     amLODipine (NORVASC) 10 MG tablet; Take 1 tablet (10 mg total) by mouth daily. -     hydrochlorothiazide (HYDRODIURIL) 25 MG tablet; Take 1 tablet (25 mg total) by mouth daily.  Annual physical exam -     CBC with Differential; Future  Fatigue, unspecified type Rule out deficiencies patient does have 2 jobs but states sleeps 6 to 8 hours nightly -     Vitamin B12; Future -     CBC with Differential; Future -     TSH + free T4; Future  Mixed hyperlipidemia History of hyperlipidemia after lipids are obtained we will determine if medication is needed.  Discuss low-fat diet. -  Lipid panel; Future   No problem-specific Assessment & Plan notes found for this encounter.  Counseled on healthy lifestyle choices, including diet (rich in fruits, vegetables and lean meats and low in salt and simple carbohydrates) and exercise (at least 30 minutes of moderate physical activity daily).  Patient to follow up in 1 year for annual exam or sooner if needed.  The above assessment and management plan was discussed with the patient. The patient verbalized understanding of and has agreed to the management plan. Patient is aware to call the clinic if symptoms persist or worsen. Patient is aware when to return to the clinic for a follow-up visit. Patient educated on when it is appropriate to go to the emergency department.   Juluis Mire NP-C [address] [place] [phone]

## 2019-10-10 ENCOUNTER — Ambulatory Visit (INDEPENDENT_AMBULATORY_CARE_PROVIDER_SITE_OTHER): Payer: 59

## 2019-10-10 ENCOUNTER — Other Ambulatory Visit: Payer: Self-pay

## 2019-10-10 DIAGNOSIS — I10 Essential (primary) hypertension: Secondary | ICD-10-CM

## 2019-10-10 DIAGNOSIS — Z0000 Encounter for general adult medical examination without abnormal findings: Secondary | ICD-10-CM

## 2019-10-10 DIAGNOSIS — E782 Mixed hyperlipidemia: Secondary | ICD-10-CM

## 2019-10-10 DIAGNOSIS — R5383 Other fatigue: Secondary | ICD-10-CM

## 2019-10-10 NOTE — Progress Notes (Unsigned)
Here for blood work orders /

## 2019-10-11 LAB — TSH+FREE T4
Free T4: 1.16 ng/dL (ref 0.82–1.77)
TSH: 1.49 u[IU]/mL (ref 0.450–4.500)

## 2019-10-11 LAB — CBC WITH DIFFERENTIAL/PLATELET
Basophils Absolute: 0 10*3/uL (ref 0.0–0.2)
Basos: 0 %
EOS (ABSOLUTE): 0.3 10*3/uL (ref 0.0–0.4)
Eos: 4 %
Hematocrit: 38 % (ref 37.5–51.0)
Hemoglobin: 13.2 g/dL (ref 13.0–17.7)
Immature Grans (Abs): 0 10*3/uL (ref 0.0–0.1)
Immature Granulocytes: 0 %
Lymphocytes Absolute: 2.4 10*3/uL (ref 0.7–3.1)
Lymphs: 31 %
MCH: 33.8 pg — ABNORMAL HIGH (ref 26.6–33.0)
MCHC: 34.7 g/dL (ref 31.5–35.7)
MCV: 97 fL (ref 79–97)
Monocytes Absolute: 0.8 10*3/uL (ref 0.1–0.9)
Monocytes: 10 %
Neutrophils Absolute: 4.1 10*3/uL (ref 1.4–7.0)
Neutrophils: 55 %
Platelets: 260 10*3/uL (ref 150–450)
RBC: 3.9 x10E6/uL — ABNORMAL LOW (ref 4.14–5.80)
RDW: 12.8 % (ref 11.6–15.4)
WBC: 7.6 10*3/uL (ref 3.4–10.8)

## 2019-10-11 LAB — CMP14+EGFR
ALT: 27 IU/L (ref 0–44)
AST: 20 IU/L (ref 0–40)
Albumin/Globulin Ratio: 1.3 (ref 1.2–2.2)
Albumin: 4.1 g/dL (ref 3.8–4.9)
Alkaline Phosphatase: 94 IU/L (ref 48–121)
BUN/Creatinine Ratio: 15 (ref 9–20)
BUN: 13 mg/dL (ref 6–24)
Bilirubin Total: 0.4 mg/dL (ref 0.0–1.2)
CO2: 25 mmol/L (ref 20–29)
Calcium: 9.2 mg/dL (ref 8.7–10.2)
Chloride: 105 mmol/L (ref 96–106)
Creatinine, Ser: 0.88 mg/dL (ref 0.76–1.27)
GFR calc Af Amer: 109 mL/min/{1.73_m2} (ref >59)
GFR calc non Af Amer: 95 mL/min/{1.73_m2} (ref >59)
Globulin, Total: 3.1 g/dL (ref 1.5–4.5)
Glucose: 115 mg/dL — ABNORMAL HIGH (ref 65–99)
Potassium: 4.2 mmol/L (ref 3.5–5.2)
Sodium: 141 mmol/L (ref 134–144)
Total Protein: 7.2 g/dL (ref 6.0–8.5)

## 2019-10-11 LAB — VITAMIN B12: Vitamin B-12: 624 pg/mL (ref 232–1245)

## 2019-10-11 LAB — LIPID PANEL
Chol/HDL Ratio: 3.3 ratio (ref 0.0–5.0)
Cholesterol, Total: 132 mg/dL (ref 100–199)
HDL: 40 mg/dL (ref >39)
LDL Chol Calc (NIH): 65 mg/dL (ref 0–99)
Triglycerides: 159 mg/dL — ABNORMAL HIGH (ref 0–149)
VLDL Cholesterol Cal: 27 mg/dL (ref 5–40)

## 2019-10-19 ENCOUNTER — Telehealth (INDEPENDENT_AMBULATORY_CARE_PROVIDER_SITE_OTHER): Payer: Self-pay

## 2019-10-19 NOTE — Telephone Encounter (Signed)
Spoke with patient. He verified date of birth. He is aware that B 12 levels are normal; no need for supplement. Thyroid levels are normal; no need for medication. Triglycerides remain elevated but lower than one year ago. Continue taking atorvastatin at bedtime. Decrease fatty food, red meats, cheese and milk. Increase veggies and whole grains. Patient repeated results to ensure understanding. He did not have any questions and verbalized understanding. Maryjean Morn, CMA

## 2019-10-19 NOTE — Telephone Encounter (Signed)
-----   Message from Grayce Sessions, NP sent at 10/18/2019 10:03 PM EDT ----- Thyroid levels are normal no medication indicated.  Triglycerides remain elevated but down from a year ago. Try to decrease your fatty foods, red meat, cheese, milk and increase fiber like whole grains and veggies.  Continue your atorvastatin 20 mg at bedtime.  B12 is normal needing no supplement

## 2019-12-14 ENCOUNTER — Emergency Department (HOSPITAL_COMMUNITY)
Admission: EM | Admit: 2019-12-14 | Discharge: 2019-12-14 | Disposition: A | Discharge status: Home / Self Care | Payer: 59 | Attending: Physician Assistant | Admitting: Physician Assistant

## 2019-12-14 ENCOUNTER — Other Ambulatory Visit: Payer: Self-pay

## 2019-12-14 DIAGNOSIS — F329 Major depressive disorder, single episode, unspecified: Secondary | ICD-10-CM | POA: Diagnosis present

## 2019-12-14 DIAGNOSIS — Z79899 Other long term (current) drug therapy: Secondary | ICD-10-CM | POA: Diagnosis not present

## 2019-12-14 DIAGNOSIS — Z20822 Contact with and (suspected) exposure to covid-19: Secondary | ICD-10-CM | POA: Diagnosis not present

## 2019-12-14 DIAGNOSIS — Z87891 Personal history of nicotine dependence: Secondary | ICD-10-CM | POA: Diagnosis not present

## 2019-12-14 DIAGNOSIS — F32A Depression, unspecified: Secondary | ICD-10-CM

## 2019-12-14 DIAGNOSIS — I10 Essential (primary) hypertension: Secondary | ICD-10-CM | POA: Insufficient documentation

## 2019-12-14 LAB — CBC WITH DIFFERENTIAL/PLATELET
Abs Immature Granulocytes: 0.03 10*3/uL (ref 0.00–0.07)
Basophils Absolute: 0 10*3/uL (ref 0.0–0.1)
Basophils Relative: 0 %
Eosinophils Absolute: 0.3 10*3/uL (ref 0.0–0.5)
Eosinophils Relative: 4 %
HCT: 35.3 % — ABNORMAL LOW (ref 39.0–52.0)
Hemoglobin: 12 g/dL — ABNORMAL LOW (ref 13.0–17.0)
Immature Granulocytes: 0 %
Lymphocytes Relative: 32 %
Lymphs Abs: 2.2 10*3/uL (ref 0.7–4.0)
MCH: 32.6 pg (ref 26.0–34.0)
MCHC: 34 g/dL (ref 30.0–36.0)
MCV: 95.9 fL (ref 80.0–100.0)
Monocytes Absolute: 0.8 10*3/uL (ref 0.1–1.0)
Monocytes Relative: 11 %
Neutro Abs: 3.5 10*3/uL (ref 1.7–7.7)
Neutrophils Relative %: 53 %
Platelets: 271 10*3/uL (ref 150–400)
RBC: 3.68 MIL/uL — ABNORMAL LOW (ref 4.22–5.81)
RDW: 12.7 % (ref 11.5–15.5)
WBC: 6.9 10*3/uL (ref 4.0–10.5)
nRBC: 0 % (ref 0.0–0.2)

## 2019-12-14 LAB — COMPREHENSIVE METABOLIC PANEL WITH GFR
ALT: 29 U/L (ref 0–44)
AST: 18 U/L (ref 15–41)
Albumin: 3.7 g/dL (ref 3.5–5.0)
Alkaline Phosphatase: 80 U/L (ref 38–126)
Anion gap: 9 (ref 5–15)
BUN: 14 mg/dL (ref 6–20)
CO2: 25 mmol/L (ref 22–32)
Calcium: 8.6 mg/dL — ABNORMAL LOW (ref 8.9–10.3)
Chloride: 104 mmol/L (ref 98–111)
Creatinine, Ser: 0.97 mg/dL (ref 0.61–1.24)
GFR, Estimated: >60 mL/min
Glucose, Bld: 143 mg/dL — ABNORMAL HIGH (ref 70–99)
Potassium: 4.1 mmol/L (ref 3.5–5.1)
Sodium: 138 mmol/L (ref 135–145)
Total Bilirubin: 0.6 mg/dL (ref 0.3–1.2)
Total Protein: 7.5 g/dL (ref 6.5–8.1)

## 2019-12-14 LAB — ETHANOL: Alcohol, Ethyl (B): <10 mg/dL

## 2019-12-14 LAB — RESPIRATORY PANEL BY RT PCR (FLU A&B, COVID)
Influenza A by PCR: NEGATIVE
Influenza B by PCR: NEGATIVE
SARS Coronavirus 2 by RT PCR: NEGATIVE

## 2019-12-14 NOTE — BH Assessment (Addendum)
Assessment Note  James Robinson is an 59 y.o. male. States, "I am lonely", "I have not one to talk to about my problems", "I live alone", and "I come home to no one". Patient self reports a hx of depression. Worsening symptoms this week. Depressive symptoms reported: feeling worthless/self pity, oss of interest in usual pleasures, guilt, isolating, insomnia, feeling angry/irritable. He has moderate anxiety. States that he is "stress eating". Reports over eating when not hungry (no sx's of purging). He has gained #40 pounds in 2 yrs. Sleep is poor. Most significant stressors is "shame and guilt about my substance use" and loneliness. He has some support from sister but doesn't want her to know about his drug use.   Patient denies SI. No history of suicide attempts or gestures. No history of self mutilating behaviors. He has some family history of depression (maternal). Denies HI. Denies history of violent and/or aggressive behaviors. No current legal issues. No AVH's. Patient does not have a therapist and/or psychiatrist. He has no history of inpatient psychiatric treatment. However, was on a observation unit at [facility] in the 1990's.   He reports current drug use of cocaine, alcohol, and THC. His last use of all substance was yesterday. He used $10 worth of crack which is his average daily use for the past 10 yrs. He drank #2 shots of liquor yesterday. States that he drinks 3-4 times per week (1 cocktail of 1 beer) per use. He smoked a "few totes" of THC yesterday. States that he smokes THC 3-4 times per week. Denies withdrawal symptoms. No history of seizures or DT's. He has participated in a residential substance use program in [place] [phone]) on 2 occasions. Negative consequences of substance use include problems related to finances, relationships, and legal issues. He was incarcerated due to drug use over 15 yrs ago.    Diagnosis: Depressive Disorder and Substance Use Disorder  Past Medical History:   Past Medical History:  Diagnosis Date  . Arthritis   . Hypertension Dx 2008   on BP medicine for a brief period   . Migraine Dx 1985    No past surgical history on file.  Family History:  Family History  Problem Relation Age of Onset  . Glaucoma Mother   . Diabetes Neg Hx   . Heart disease Neg Hx   . Cancer Neg Hx     Social History:  reports that he quit smoking about 4 years ago. His smoking use included cigarettes. He has a 13.50 pack-year smoking history. He has never used smokeless tobacco. He reports current drug use. Drug: Marijuana. He reports that he does not drink alcohol.  Additional Social History:  Alcohol / Drug Use Pain Medications: SEE MAR Prescriptions: SEE MAR Over the Counter: SEE MAR History of alcohol / drug use?: Yes Negative Consequences of Use: Financial, Legal, Work / School (When younger use to sell drugs. States that he was caught selling drugs and incarcerated for a short time. No legal issues in 15 yrs.) Substance #1 Name of Substance 1: Cocaine 1 - Age of First Use: 20's 1 - Amount (size/oz): $10 per day 1 - Frequency: daily 1 - Duration: daily use for the past 10 yrs ("I've used in secret....Marland Kitchenno one knows I use but me and the people I get it from") 1 - Last Use / Amount: Yesterday; $10 worth Substance #2 Name of Substance 2: THC 2 - Age of First Use: 59 yrs old 2 - Amount (size/oz): "I use  a few totes"; "I can make a joint stretch for 2-3 days" 2 - Frequency: 3 uses in a wekk 2 - Duration: on-going 2 - Last Use / Amount: Yesterday: "A couple of totes off a joint" Substance #3 Name of Substance 3: Alcohol 3 - Age of First Use: teens 3 - Amount (size/oz): #1 cocktail or a beer a couple times per week 3 - Frequency: #1 cocktail or a beer a couple times per week 3 - Duration: on-going 3 - Last Use / Amount: 12/14/19: #2 shots of Vodka  CIWA: CIWA-Ar BP: (!) 141/82 Pulse Rate: (!) 58 COWS:    Allergies: No Known Allergies  Home  Medications: (Not in a hospital admission)   OB/GYN Status:  No LMP for male patient.  General Assessment Data Location of Assessment: [facility] TTS Assessment: In system Is this a Tele or Face-to-Face Assessment?: Tele Assessment Is this an Initial Assessment or a Re-assessment for this encounter?: Initial Assessment Patient Accompanied by::  (drove self to the ER ) Language Other than English: No Living Arrangements:  (lives alone ) What gender do you identify as?: Male Date Telepsych consult ordered in [facility]:  (12/14/2019) Marital status: Single Maiden name:  (n/a) Pregnancy Status: No Living Arrangements: Alone Can pt return to current living arrangement?: Yes Admission Status: Voluntary Is patient capable of signing voluntary admission?: Yes Referral Source: Psychiatrist Insurance type:  Government social research officer )     Crisis Care Plan Living Arrangements: Alone Legal Guardian:  (no legal guardian ) Name of Psychiatrist:  (no psychiatrist ) Name of Therapist:  (no therapist )  Education Status Is patient currently in school?: No Is the patient employed, unemployed or receiving disability?: Employed  Risk to self with the past 6 months Suicidal Ideation: No Has patient been a risk to self within the past 6 months prior to admission? : No Suicidal Intent: No Has patient had any suicidal intent within the past 6 months prior to admission? : No Is patient at risk for suicide?: No Suicidal Plan?: No Has patient had any suicidal plan within the past 6 months prior to admission? : No Access to Means: No What has been your use of drugs/alcohol within the last 12 months?:  (alcohol & cocaine ) Previous Attempts/Gestures: No How many times?:  (0) Other Self Harm Risks:  (no self harm ) Triggers for Past Attempts: Other (Comment) (n/a) Intentional Self Injurious Behavior: None Family Suicide History: Yes ("I was told depression runs on my mothers side of the family) Recent stressful  life event(s):  ("I don't have anyone to talk to": mo. passed around holidays) Persecutory voices/beliefs?: No Depression Symptoms: Feeling worthless/self pity, Loss of interest in usual pleasures, Guilt, Isolating, Insomnia, Feeling angry/irritable ("I have guilt, I'm embarassed, and ashamed at the same time") Substance abuse history and/or treatment for substance abuse?: No Suicide prevention information given to non-admitted patients: Not applicable  Risk to Others within the past 6 months Homicidal Ideation: No Does patient have any lifetime risk of violence toward others beyond the six months prior to admission? : No Thoughts of Harm to Others: No Current Homicidal Intent: No Current Homicidal Plan: No Access to Homicidal Means: No Identified Victim:  (n/a) History of harm to others?: No Assessment of Violence: None Noted Violent Behavior Description:  (patient calm and cooperative ) Does patient have access to weapons?: No Criminal Charges Pending?: No Does patient have a court date: No Is patient on probation?: No  Psychosis Hallucinations: None noted Delusions:  None noted  Mental Status Report Appearance/Hygiene: In scrubs Eye Contact: Fair Motor Activity: Freedom of movement Speech: Logical/coherent Level of Consciousness: Alert Mood: Depressed, Sad Affect: Depressed Anxiety Level: Severe ("I'm always anxious about something") Thought Processes: Relevant Judgement: Impaired Orientation: Person, Place, Time, Situation Obsessive Compulsive Thoughts/Behaviors: None  Cognitive Functioning Concentration: Normal Memory: Recent Intact, Remote Intact Is patient IDD: No Insight: Poor Impulse Control: Poor Appetite: Poor ("I eat when I'm not hungry") Have you had any weight changes? : Gain Amount of the weight change? (lbs):  (in 37yrs patient gained 40 pounds ) Sleep: Decreased Vegetative Symptoms: None  ADLScreening [facility] Assessment Services) Patient's cognitive  ability adequate to safely complete daily activities?: No Patient able to express need for assistance with ADLs?: Yes Independently performs ADLs?: Yes (appropriate for developmental age)  Prior Inpatient Therapy Prior Inpatient Therapy: Yes Prior Therapy Dates:  (1980's) Prior Therapy Facilty/Provider(s):  (facility in [place] (2x's)-1980's; Obs @ [facility]-90s) Reason for Treatment:  (substance use and depression )  Prior Outpatient Therapy Prior Outpatient Therapy: No Prior Therapy Dates:  (n/a) Prior Therapy Facilty/Provider(s):  (n/a) Reason for Treatment:  (n/a) Does patient have an ACCT team?: No Does patient have Intensive In-House Services?  : No Does patient have [facility] services? : No Does patient have [facility] services?: No  ADL Screening (condition at time of admission) Patient's cognitive ability adequate to safely complete daily activities?: No Patient able to express need for assistance with ADLs?: Yes Independently performs ADLs?: Yes (appropriate for developmental age)                        Disposition: Per Berneice Heinrich, NP, patient is psych cleared. He does not meet criteria for inpatient psychiatric treatment. His main complaint is depression and substance use (cocaine, alcohol, THC). I don't see a note or H&P from EDP at all regarding any concerns for patient being a risk to self. I only see a note from his nurse regarding depressive complaints. He denied SI, HI, and AVH with this clinician. Patient not interested in substance use residential treatment and/or CDIOP due to work schedule. Berneice Heinrich, NP, has placed an order for peer support to discuss alternative outpatient substance use treatment programs. Patient is otherwise ok to discharge.  Disposition Initial Assessment Completed for this Encounter: Yes  On Site Evaluation by:   Reviewed with Physician:    Melynda Ripple 12/14/2019 5:10 PM

## 2019-12-14 NOTE — ED Notes (Signed)
Provided pt with sandwich and drink 

## 2019-12-14 NOTE — ED Notes (Signed)
Pt is aware urine sample is needed and will notify staff when he can provide one.

## 2019-12-14 NOTE — ED Provider Notes (Signed)
[facility]-EMERGENCY DEPT Provider Note   CSN: [id number] Arrival date & time: 12/14/19  1423     History Chief Complaint  Patient presents with  . Depression    James Robinson is a 59 y.o. male.  Pt reports he has been depressed.  Pt reports some thoughts of self harm but no plan.  Pt reports he works a lot, some alcohol and drug use.    The history is provided by the patient. No language interpreter was used.  Depression This is a new problem. The current episode started more than 1 week ago. The problem occurs constantly. The problem has been gradually worsening. Pertinent negatives include no chest pain and no headaches. Nothing aggravates the symptoms. Nothing relieves the symptoms. He has tried nothing for the symptoms. The treatment provided no relief.       Past Medical History:  Diagnosis Date  . Arthritis   . Hypertension Dx 2008   on BP medicine for a brief period   . Migraine Dx 1985    Patient Active Problem List   Diagnosis Date Noted  . Enlarged prostate on rectal examination 01/01/2014  . Current smoker 01/01/2014  . Rectal bleeding 01/01/2014  . SHOULDER PAIN, BILATERAL 04/13/2008  . KNEE PAIN, RIGHT 04/13/2008  . HEMORRHOIDS, INTERNAL 08/04/2007  . Migraine headache 01/19/2007  . H/O: HTN (hypertension) 01/19/2007    No past surgical history on file.     Family History  Problem Relation Age of Onset  . Glaucoma Mother   . Diabetes Neg Hx   . Heart disease Neg Hx   . Cancer Neg Hx     Social History   Tobacco Use  . Smoking status: Former Smoker    Packs/day: 0.50    Years: 27.00    Pack years: 13.50    Types: Cigarettes    Quit date: 07/23/2015    Years since quitting: 4.3  . Smokeless tobacco: Never Used  Vaping Use  . Vaping Use: Never used  Substance Use Topics  . Alcohol use: No    Alcohol/week: 0.0 standard drinks    Comment: social  . Drug use: Yes    Types: Marijuana    Comment:  Last use 2014     Home Medications Prior to Admission medications   Medication Sig Start Date End Date Taking? Authorizing Provider  naproxen sodium (ALEVE) 220 MG tablet Take 220 mg by mouth 2 (two) times daily as needed (pain).   Yes [provider]  amLODipine (NORVASC) 10 MG tablet Take 1 tablet (10 mg total) by mouth daily. 09/11/19   Grayce Sessions, NP  atorvastatin (LIPITOR) 20 MG tablet Take 1 tablet (20 mg total) by mouth daily. Patient not taking: Reported on 09/11/2019 09/17/18   Grayce Sessions, NP  celecoxib (CELEBREX) 50 MG capsule Take 1 capsule (50 mg total) by mouth 2 (two) times daily. Patient not taking: Reported on 09/11/2019 09/14/18   Grayce Sessions, NP  diclofenac (VOLTAREN) 75 MG EC tablet Take 1 tablet (75 mg total) by mouth 2 (two) times daily. Patient not taking: Reported on 09/11/2019 08/10/18   Grayce Sessions, NP  hydrochlorothiazide (HYDRODIURIL) 25 MG tablet Take 1 tablet (25 mg total) by mouth daily. 09/11/19   Grayce Sessions, NP    Allergies    Patient has no known allergies.  Review of Systems   Review of Systems  Cardiovascular: Negative for chest pain.  Neurological: Negative for headaches.  Psychiatric/Behavioral:  Positive for depression.  All other systems reviewed and are negative.   Physical Exam Updated Vital Signs BP (!) 141/82 (BP Location: Right Arm)   Pulse (!) 58   Temp 97.9 F (36.6 C) (Oral)   Resp 16   SpO2 100%   Physical Exam Vitals and nursing note reviewed.  Constitutional:      Appearance: He is well-developed.  HENT:     Head: Normocephalic and atraumatic.     Nose: Nose normal.     Mouth/Throat:     Mouth: Mucous membranes are moist.  Eyes:     Conjunctiva/sclera: Conjunctivae normal.  Cardiovascular:     Rate and Rhythm: Normal rate and regular rhythm.     Heart sounds: No murmur heard.   Pulmonary:     Effort: Pulmonary effort is normal. No respiratory distress.     Breath sounds: Normal breath sounds.   Abdominal:     Palpations: Abdomen is soft.     Tenderness: There is no abdominal tenderness.  Musculoskeletal:        General: Normal range of motion.     Cervical back: Neck supple.  Skin:    General: Skin is warm and dry.  Neurological:     Mental Status: He is alert.  Psychiatric:        Mood and Affect: Mood normal.     ED Results / Procedures / Treatments   Labs (all labs ordered are listed, but only abnormal results are displayed) Labs Reviewed  COMPREHENSIVE METABOLIC PANEL - Abnormal; Notable for the following components:      Result Value   Glucose, Bld 143 (*)    Calcium 8.6 (*)    All other components within normal limits  CBC WITH DIFFERENTIAL/PLATELET - Abnormal; Notable for the following components:   RBC 3.68 (*)    Hemoglobin 12.0 (*)    HCT 35.3 (*)    All other components within normal limits  RESPIRATORY PANEL BY RT PCR (FLU A&B, COVID)  ETHANOL  RAPID URINE DRUG SCREEN, HOSP PERFORMED    EKG None  Radiology No results found.  Procedures Procedures (including critical care time)  Medications Ordered in ED Medications - No data to display  ED Course  I have reviewed the triage vital signs and the nursing notes.  Pertinent labs & imaging results that were available during my care of the patient were reviewed by me and considered in my medical decision making (see chart for details).    MDM Rules/Calculators/A&P                          TTS assessed and advised out patient treatment  Final Clinical Impression(s) / ED Diagnoses Final diagnoses:  Depression, unspecified depression type    Rx / DC Orders ED Discharge Orders    None    Referrals to    Elson Areas, PA-C 12/14/19 1918    Benjiman Core, MD 12/14/19 2235

## 2019-12-14 NOTE — BH Assessment (Signed)
Per Berneice Heinrich, NP, patient is psych cleared. He does not meet criteria for inpatient psychiatric treatment. His main complaint is depression and substance use (cocaine, alcohol, THC). He denied SI, HI, and AVH with this clinician. Patient not interested in substance use residential treatment and/or CDIOP due to work schedule. Berneice Heinrich, NP, has placed an order for peer support to discuss alternative outpatient substance use treatment programs. Patient is otherwise ok to discharge.

## 2019-12-14 NOTE — ED Triage Notes (Signed)
Pt brought self to [facility].  Pt depressed and feeling lethargic

## 2020-03-18 ENCOUNTER — Ambulatory Visit (INDEPENDENT_AMBULATORY_CARE_PROVIDER_SITE_OTHER): Payer: 59

## 2020-03-18 ENCOUNTER — Ambulatory Visit
Admission: EM | Admit: 2020-03-18 | Discharge: 2020-03-18 | Disposition: A | Discharge status: Home / Self Care | Payer: 59 | Attending: Family Medicine | Admitting: Family Medicine

## 2020-03-18 ENCOUNTER — Other Ambulatory Visit: Payer: Self-pay

## 2020-03-18 DIAGNOSIS — I10 Essential (primary) hypertension: Secondary | ICD-10-CM

## 2020-03-18 DIAGNOSIS — M545 Low back pain, unspecified: Secondary | ICD-10-CM | POA: Diagnosis not present

## 2020-03-18 DIAGNOSIS — M5416 Radiculopathy, lumbar region: Secondary | ICD-10-CM

## 2020-03-18 MED ORDER — METHOCARBAMOL 500 MG PO TABS: 500.0000 mg | ORAL_TABLET | Freq: Two times a day (BID) | ORAL | 0 refills | Status: DC

## 2020-03-18 MED ORDER — KETOROLAC TROMETHAMINE 30 MG/ML IJ SOLN
30.0000 mg | Freq: Once | INTRAMUSCULAR | Status: AC
  Administered 2020-03-18: 30 mg via INTRAMUSCULAR

## 2020-03-18 MED ORDER — KETOROLAC TROMETHAMINE 60 MG/2ML IM SOLN: 60.0000 mg | Freq: Once | INTRAMUSCULAR | Status: DC

## 2020-03-18 MED ORDER — AMLODIPINE BESYLATE 10 MG PO TABS: 10.0000 mg | ORAL_TABLET | Freq: Every day | ORAL | 0 refills | Status: DC

## 2020-03-18 MED ORDER — PREDNISONE 20 MG PO TABS: 40.0000 mg | ORAL_TABLET | Freq: Every day | ORAL | 0 refills | Status: DC

## 2020-03-18 MED ORDER — CLONIDINE HCL 0.1 MG PO TABS
0.1000 mg | ORAL_TABLET | Freq: Once | ORAL | Status: AC
  Administered 2020-03-18: 0.1 mg via ORAL

## 2020-03-18 NOTE — ED Provider Notes (Signed)
[facility]    CSN: [id number] Arrival date & time: 03/18/20  3295      History   Chief Complaint Chief Complaint  Patient presents with  . Back Pain    HPI James Robinson is a 60 y.o. male.   HPI Patient presents today with low back pain.  He denies any known injury.  Pain has been present over the course of the last 3 days.  Only strenuous activity patient has been involved in his that he washed the car but denies any sensation of popping or pulling any type of a muscle while performing this activity.  He has been taking Aleve without improvement of pain.  Pain is present with sitting or any movement.  He denies any issues with urination incontinence or loss of bowel.  Patient has a history of osteoarthritis involving both knees lumbar spine pain.  Patient's blood pressure is elevated on arrival today.  Patient reports that he discontinued taking his blood pressure medications after they ran out he never requested any refills.  He is followed by [facility] and has not been seen since August 2021.  He denies any chest pain, headaches, dizziness. Past Medical History:  Diagnosis Date  . Arthritis   . Hypertension Dx 2008   on BP medicine for a brief period   . Migraine Dx 1985    Patient Active Problem List   Diagnosis Date Noted  . Enlarged prostate on rectal examination 01/01/2014  . Current smoker 01/01/2014  . Rectal bleeding 01/01/2014  . SHOULDER PAIN, BILATERAL 04/13/2008  . KNEE PAIN, RIGHT 04/13/2008  . HEMORRHOIDS, INTERNAL 08/04/2007  . Migraine headache 01/19/2007  . H/O: HTN (hypertension) 01/19/2007    History reviewed. No pertinent surgical history.     Home Medications    Prior to Admission medications   Medication Sig Start Date End Date Taking? Authorizing Provider  naproxen sodium (ALEVE) 220 MG tablet Take 220 mg by mouth 2 (two) times daily as needed (pain).   Yes [provider]  amLODipine (NORVASC) 10  MG tablet Take 1 tablet (10 mg total) by mouth daily. 09/11/19 12/14/19  Grayce Sessions, NP  atorvastatin (LIPITOR) 20 MG tablet Take 1 tablet (20 mg total) by mouth daily. Patient not taking: Reported on 09/11/2019 09/17/18 12/14/19  Grayce Sessions, NP  hydrochlorothiazide (HYDRODIURIL) 25 MG tablet Take 1 tablet (25 mg total) by mouth daily. 09/11/19 12/14/19  Grayce Sessions, NP    Family History Family History  Problem Relation Age of Onset  . Glaucoma Mother   . Diabetes Neg Hx   . Heart disease Neg Hx   . Cancer Neg Hx     Social History Social History   Tobacco Use  . Smoking status: Former Smoker    Packs/day: 0.50    Years: 27.00    Pack years: 13.50    Types: Cigarettes    Quit date: 07/23/2015    Years since quitting: 4.6  . Smokeless tobacco: Never Used  Vaping Use  . Vaping Use: Never used  Substance Use Topics  . Alcohol use: No    Alcohol/week: 0.0 standard drinks    Comment: social  . Drug use: Yes    Types: Marijuana    Comment:  Last use 2014     Allergies   Patient has no known allergies.   Review of Systems Review of Systems Pertinent negatives listed in HPI  Physical Exam Triage Vital Signs ED Triage Vitals  Enc Vitals Group     BP 03/18/20 0956 (!) 190/45     Pulse Rate 03/18/20 0956 90     Resp 03/18/20 0956 18     Temp 03/18/20 0956 98.8 F (37.1 C)     Temp Source 03/18/20 0956 Oral     SpO2 03/18/20 0956 97 %     Weight 03/18/20 0955 210 lb (95.3 kg)     Height 03/18/20 0955 5\' 11"  (1.803 m)     Head Circumference --      Peak Flow --      Pain Score 03/18/20 0953 10     Pain Loc --      Pain Edu? --      Excl. in GC? --    No data found.  Updated Vital Signs BP (!) 190/45 (BP Location: Left Arm)   Pulse 90   Temp 98.8 F (37.1 C) (Oral)   Resp 18   Ht 5\' 11"  (1.803 m)   Wt 210 lb (95.3 kg)   SpO2 97%   BMI 29.29 kg/m   Visual Acuity Right Eye Distance:   Left Eye Distance:   Bilateral Distance:    Right  Eye Near:   Left Eye Near:    Bilateral Near:     Physical Exam  General appearance: alert, overweight, mildly distressed related to pain,  cooperative Head: Normocephalic, without obvious abnormality, atraumatic Respiratory: Respirations even and unlabored, normal respiratory rate Heart: Rate and rhythm normal. No gallop or murmurs noted on exam  Thoracic/Lumbar Back:  Patient appears to be in mild to moderate pain, antalgic gait noted.  Lumbosacral spine area reveals no local tenderness or mass.  Painful and reduced LS ROM noted.  Straight leg raise unable to perform given the degree of patient's pain exam technique was limited Motor strength and sensation normal. Peripheral pulses are palpable.  Lumbar spine X-Ray: shows DJD changes, likely chronic.  Skin: Skin color, texture, turgor normal. No rashes seen  Psych: Appropriate mood and affect. Neurologic: Mental status: Alert, oriented to person, place, and time, thought content appropriate. Vital signs as noted above.  UC Treatments / Results  Labs (all labs ordered are listed, but only abnormal results are displayed) Labs Reviewed - No data to display  EKG   Radiology No results found.  Procedures Procedures (including critical care time)  Medications Ordered in UC Medications - No data to display  Initial Impression / Assessment and Plan / UC Course  I have reviewed the triage vital signs and the nursing notes.  Pertinent labs & imaging results that were available during my care of the patient were reviewed by me and considered in my medical decision making (see chart for details).     Imaging revealed arthritic changes involving the L1-S1. No acute injury.  Treating for lumbar radiculopathy secondary to chronic arthritic lumbar spine disease.  Treating with a short course of prednisone and methocarbamol for acute pain.  Patient given Toradol while here in clinic for pain with mild relief. Patient advised to apply  heat applications to help reduce inflammation which is causing pain. Blood pressure is grossly elevated today.  Restarted amlodipine.  Held off on restarting HCTZ as I do not have any recent labs for patient noting electrolyte status or renal functioning.  Advised patient to follow-up with his primary care provider within 2 weeks for recheck of blood pressure and evaluation of lumbar spine pain.  ER precautions given.  Patient given clonidine 0.1 here in  clinic patient discharged prior to nurse recheck and blood pressure.  Patient was stable at discharge. Final Clinical Impressions(s) / UC Diagnoses   Final diagnoses:  Accelerated hypertension  Acute lumbar radiculopathy     Discharge Instructions     I'm restarting her blood pressure medicine amlodipine 10 mg once daily. Schedule follow-up with your primary care provider for recheck of blood pressure. I'm also prescribing you methocarbamol you may take this twice daily as needed for back pain. For acute lumbar radiculopathy related to arthritic changes in your back and put you on prednisone 40 mg once daily for total of 5 days. Recommend applying heat to help manage pain. If pain persist recommend follow-up evaluation at orthopedic specialty. I have included information to follow-up with [facility].    ED Prescriptions    Medication Sig Dispense Auth. Provider   amLODipine (NORVASC) 10 MG tablet Take 1 tablet (10 mg total) by mouth daily. 60 tablet Bing Neighbors, FNP   predniSONE (DELTASONE) 20 MG tablet Take 2 tablets (40 mg total) by mouth daily with breakfast. 10 tablet Bing Neighbors, FNP   methocarbamol (ROBAXIN) 500 MG tablet Take 1 tablet (500 mg total) by mouth 2 (two) times daily. 20 tablet Bing Neighbors, FNP     PDMP not reviewed this encounter.   Bing Neighbors, FNP 03/18/20 [phone]

## 2020-03-18 NOTE — Discharge Instructions (Addendum)
I'm restarting her blood pressure medicine amlodipine 10 mg once daily. Schedule follow-up with your primary care provider for recheck of blood pressure. I'm also prescribing you methocarbamol you may take this twice daily as needed for back pain. For acute lumbar radiculopathy related to arthritic changes in your back and put you on prednisone 40 mg once daily for total of 5 days. Recommend applying heat to help manage pain. If pain persist recommend follow-up evaluation at orthopedic specialty. I have included information to follow-up with [facility].

## 2020-03-18 NOTE — ED Triage Notes (Signed)
Pt c/o back pain since this past Wednesday. Pt states pain has been increasing since Friday, reports pain with sitting/standing/movement. Pt also reports pain to the inner right thigh. Pt denies any urinary or bowel issues.

## 2020-04-02 ENCOUNTER — Encounter (INDEPENDENT_AMBULATORY_CARE_PROVIDER_SITE_OTHER): Payer: Self-pay | Admitting: Primary Care

## 2020-04-02 ENCOUNTER — Other Ambulatory Visit: Payer: Self-pay

## 2020-04-02 ENCOUNTER — Ambulatory Visit (INDEPENDENT_AMBULATORY_CARE_PROVIDER_SITE_OTHER): Payer: 59 | Admitting: Primary Care

## 2020-04-02 VITALS — BP 146/83 | HR 77 | Temp 97.3°F | Ht 71.0 in | Wt 238.2 lb

## 2020-04-02 DIAGNOSIS — E782 Mixed hyperlipidemia: Secondary | ICD-10-CM

## 2020-04-02 DIAGNOSIS — M545 Low back pain, unspecified: Secondary | ICD-10-CM | POA: Diagnosis not present

## 2020-04-02 DIAGNOSIS — Z23 Encounter for immunization: Secondary | ICD-10-CM

## 2020-04-02 DIAGNOSIS — I10 Essential (primary) hypertension: Secondary | ICD-10-CM

## 2020-04-02 DIAGNOSIS — Z09 Encounter for follow-up examination after completed treatment for conditions other than malignant neoplasm: Secondary | ICD-10-CM | POA: Diagnosis not present

## 2020-04-02 MED ORDER — NAPROXEN 500 MG PO TABS
500.0000 mg | ORAL_TABLET | Freq: Two times a day (BID) | ORAL | 1 refills | Status: DC
Start: 2020-04-02 — End: 2020-07-10

## 2020-04-02 MED ORDER — AMLODIPINE BESYLATE 10 MG PO TABS: 10.0000 mg | ORAL_TABLET | Freq: Every day | ORAL | 0 refills | Status: DC

## 2020-04-02 MED ORDER — HYDROCHLOROTHIAZIDE 25 MG PO TABS: 25.0000 mg | ORAL_TABLET | Freq: Every day | ORAL | 1 refills | Status: DC

## 2020-04-02 NOTE — Progress Notes (Signed)
HPI Mr .James Robinson  Is a  60 y.o.male presents for follow up from the hospital. Presented to Urgent care on  03/18/20 for low back pain -he recall washing  the car but denies any sensation of popping or pulling any type of a muscle while performing this activity patient. Treated with prednisone and antispasmodic . Management of HTN medications refilled during urgent care visit . Denies shortness of breath, headaches, chest pain or lower extremity edema  Past Medical History:  Diagnosis Date  . Arthritis   . Hypertension Dx 2008   on BP medicine for a brief period   . Migraine Dx 1985     No Known Allergies    Current Outpatient Medications on File Prior to Visit  Medication Sig Dispense Refill  . amLODipine (NORVASC) 10 MG tablet Take 1 tablet (10 mg total) by mouth daily. 60 tablet 0  . methocarbamol (ROBAXIN) 500 MG tablet Take 1 tablet (500 mg total) by mouth 2 (two) times daily. 20 tablet 0  . naproxen sodium (ALEVE) 220 MG tablet Take 220 mg by mouth 2 (two) times daily as needed (pain).    . predniSONE (DELTASONE) 20 MG tablet Take 2 tablets (40 mg total) by mouth daily with breakfast. 10 tablet 0  . [DISCONTINUED] atorvastatin (LIPITOR) 20 MG tablet Take 1 tablet (20 mg total) by mouth daily. (Patient not taking: Reported on 09/11/2019) 90 tablet 3  . [DISCONTINUED] hydrochlorothiazide (HYDRODIURIL) 25 MG tablet Take 1 tablet (25 mg total) by mouth daily. 90 tablet 0   No current facility-administered medications on file prior to visit.    ROS: all negative except above.   Physical Exam: BP (!) 146/83 (BP Location: Right Arm, Patient Position: Sitting, Cuff Size: Large)   Pulse 77   Temp (!) 97.3 F (36.3 C) (Temporal)   Ht 5\' 11"  (1.803 m)   Wt 238 lb 3.2 oz (108 kg)   SpO2 94%   BMI 33.22 kg/m  General Appearance: Well nourished, in no apparent distress. Eyes: PERRLA, EOMs, conjunctiva no swelling or erythema Sinuses: No Frontal/maxillary tenderness ENT/Mouth:  Ext aud canals clear, TMs without erythema, bulging. Hearing normal.  Neck: Supple, thyroid normal.  Respiratory: Respiratory effort normal, BS equal bilaterally without rales, rhonchi, wheezing or stridor.  Cardio: RRR with no MRGs. Brisk peripheral pulses without edema.  Abdomen: Soft, + BS.  Non tender, no guarding, rebound, hernias, masses. Lymphatics: Non tender without lymphadenopathy.  Musculoskeletal:back pain 5/10 increased with bending  Skin: Warm, dry without rashes, lesions, ecchymosis.  Neuro: Cranial nerves intact. Normal muscle tone, no cerebellar symptoms.  Psych: Awake and oriented X 3, normal affect, Insight and Judgment appropriate.    James Robinson was seen today for hypertension.  Diagnoses and all orders for this visit:  Essential hypertension Counseled on blood pressure goal of less than 130/80, low-sodium, DASH diet, medication compliance, 150 minutes of moderate intensity exercise per week. Discussed medication compliance, adverse effects.  Acute bilateral low back pain, unspecified whether sciatica present BACK PAIN  Location: lumbar/sacral Quality: burning, pressure, squeezing and throbbing   Onset: gradual Worse with: bending       Better with:  Rest  Radiation: to thigh Trauma: none  Best sitting/standing/leaning forward: yes  Red Flags Fecal/urinary incontinence: no  Numbness/Weakness: no  Fever/chills/sweats: no  Night pain: no  Unexplained weight loss: no  No relief with bedrest: yes  h/o cancer/immunosuppression: no  IV drug use: no  PMH of osteoporosis or chronic steroid use: no  Hospital discharge follow-up Follow up for back pain and hypertension.   Need for immunization against influenza Completed   Other orders -     naproxen (NAPROSYN) 500 MG tablet; Take 1 tablet (500 mg total) by mouth 2 (two) times daily with a meal.   Grayce Sessions, NP 8:39 AM

## 2020-04-02 NOTE — Progress Notes (Signed)
Needs referral to pain management and ortho

## 2020-04-03 LAB — LIPID PANEL
Chol/HDL Ratio: 4 ratio (ref 0.0–5.0)
Cholesterol, Total: 174 mg/dL (ref 100–199)
HDL: 43 mg/dL (ref >39)
LDL Chol Calc (NIH): 99 mg/dL (ref 0–99)
Triglycerides: 188 mg/dL — ABNORMAL HIGH (ref 0–149)
VLDL Cholesterol Cal: 32 mg/dL (ref 5–40)

## 2020-04-07 ENCOUNTER — Other Ambulatory Visit (INDEPENDENT_AMBULATORY_CARE_PROVIDER_SITE_OTHER): Payer: Self-pay | Admitting: Primary Care

## 2020-04-07 DIAGNOSIS — E781 Pure hyperglyceridemia: Secondary | ICD-10-CM

## 2020-04-07 MED ORDER — ATORVASTATIN CALCIUM 20 MG PO TABS: 20.0000 mg | ORAL_TABLET | Freq: Every day | ORAL | 3 refills | Status: DC

## 2020-04-11 ENCOUNTER — Telehealth (INDEPENDENT_AMBULATORY_CARE_PROVIDER_SITE_OTHER): Payer: Self-pay

## 2020-04-11 NOTE — Telephone Encounter (Signed)
Patient is aware of results per PCP. He is aware of medication being sent and to take at bedtime. Advised of dietary changes to make. Maryjean Morn, CMA

## 2020-04-11 NOTE — Telephone Encounter (Signed)
-----   Message from Grayce Sessions, NP sent at 04/07/2020  6:11 PM EST ----- Elevated Triglycerides sent in atorvastin 20mg  Elevated TG can  lead to heart attack and stroke. Decrease your fatty foods, red meat, cheese, milk and increase fiber like whole grains and veggies. Sent in atorvastatin 20mg  take at bedtime

## 2020-05-07 ENCOUNTER — Encounter (INDEPENDENT_AMBULATORY_CARE_PROVIDER_SITE_OTHER): Payer: Self-pay | Admitting: Primary Care

## 2020-05-07 ENCOUNTER — Other Ambulatory Visit: Payer: Self-pay

## 2020-05-07 ENCOUNTER — Ambulatory Visit (INDEPENDENT_AMBULATORY_CARE_PROVIDER_SITE_OTHER): Payer: 59 | Admitting: Primary Care

## 2020-05-07 VITALS — BP 156/78 | HR 88 | Temp 97.3°F | Ht 71.0 in | Wt 239.0 lb

## 2020-05-07 DIAGNOSIS — Z1211 Encounter for screening for malignant neoplasm of colon: Secondary | ICD-10-CM | POA: Diagnosis not present

## 2020-05-07 DIAGNOSIS — D649 Anemia, unspecified: Secondary | ICD-10-CM

## 2020-05-07 DIAGNOSIS — I10 Essential (primary) hypertension: Secondary | ICD-10-CM

## 2020-05-07 DIAGNOSIS — R252 Cramp and spasm: Secondary | ICD-10-CM | POA: Diagnosis not present

## 2020-05-07 DIAGNOSIS — Z125 Encounter for screening for malignant neoplasm of prostate: Secondary | ICD-10-CM

## 2020-05-07 NOTE — Progress Notes (Signed)
Established Patient Office Visit  Subjective:  Patient ID: James Robinson, male    DOB: Jun 22, 1960  Age: 60 y.o. MRN: [id number]  CC:  Chief Complaint  Patient presents with  . Blood Pressure Check  . Dysmenorrhea    In hands    HPI [name] is a obese male who presents for hypertension management -Denies shortness of breath, headaches, chest pain or lower extremity edema, sudden onset, vision changes, unilateral weakness, dizziness, paresthesias. He admits he did not take Bp medication this morning. Normally take medications between 10-11. He is also concern with cramps in hands for years 2014 - he writes a lot now noticing picking up things is difficult and would like prostate check (nocturia) and referred to GI for colonoscopy.   Past Medical History:  Diagnosis Date  . Arthritis   . Hypertension Dx 2008   on BP medicine for a brief period   . Migraine Dx 1985    No past surgical history on file.  Family History  Problem Relation Age of Onset  . Glaucoma Mother   . Diabetes Neg Hx   . Heart disease Neg Hx   . Cancer Neg Hx     Social History   Socioeconomic History  . Marital status: Single    Spouse name: Not on file  . Number of children: 0  . Years of education: 53   . Highest education level: Not on file  Occupational History  . Occupation: Waiter   Tobacco Use  . Smoking status: Former Smoker    Packs/day: 0.50    Years: 27.00    Pack years: 13.50    Types: Cigarettes    Quit date: 07/23/2015    Years since quitting: 4.7  . Smokeless tobacco: Never Used  Vaping Use  . Vaping Use: Never used  Substance and Sexual Activity  . Alcohol use: No    Alcohol/week: 0.0 standard drinks    Comment: social  . Drug use: Yes    Types: Marijuana    Comment:  Last use 2014  . Sexual activity: Never    Birth control/protection: Condom  Other Topics Concern  . Not on file  Social History Narrative   Lives alone.   Sister lives in town.   Father  lives in [place].       Social Determinants of Health   Financial Resource Strain: Not on file  Food Insecurity: Not on file  Transportation Needs: Not on file  Physical Activity: Not on file  Stress: Not on file  Social Connections: Not on file  Intimate Partner Violence: Not on file    Outpatient Medications Prior to Visit  Medication Sig Dispense Refill  . amLODipine (NORVASC) 10 MG tablet Take 1 tablet (10 mg total) by mouth daily. 90 tablet 0  . atorvastatin (LIPITOR) 20 MG tablet Take 1 tablet (20 mg total) by mouth daily. 90 tablet 3  . hydrochlorothiazide (HYDRODIURIL) 25 MG tablet Take 1 tablet (25 mg total) by mouth daily. 90 tablet 1  . naproxen (NAPROSYN) 500 MG tablet Take 1 tablet (500 mg total) by mouth 2 (two) times daily with a meal. 60 tablet 1  . predniSONE (DELTASONE) 20 MG tablet Take 2 tablets (40 mg total) by mouth daily with breakfast. 10 tablet 0   No facility-administered medications prior to visit.    No Known Allergies  ROS Review of Systems  Genitourinary: Positive for frequency.       Nocturia  Musculoskeletal:       Cramps in hands   All other systems reviewed and are negative.     Objective:    Physical Exam Vitals reviewed.  Constitutional:      Appearance: He is obese.  HENT:     Head: Normocephalic.     Right Ear: External ear normal.     Left Ear: External ear normal.  Cardiovascular:     Rate and Rhythm: Normal rate and regular rhythm.  Pulmonary:     Effort: Pulmonary effort is normal.     Breath sounds: Normal breath sounds.  Abdominal:     General: Bowel sounds are normal. There is distension.     Palpations: Abdomen is soft.  Musculoskeletal:        General: Normal range of motion.     Cervical back: Normal range of motion and neck supple.  Skin:    General: Skin is warm and dry.  Neurological:     Mental Status: He is alert and oriented to person, place, and time.  Psychiatric:        Mood and Affect: Mood  normal.        Behavior: Behavior normal.        Thought Content: Thought content normal.        Judgment: Judgment normal.     BP (!) 156/78 (BP Location: Right Arm, Patient Position: Sitting, Cuff Size: Large)   Pulse 88   Temp (!) 97.3 F (36.3 C) (Temporal)   Ht 5\' 11"  (1.803 m)   Wt 239 lb (108.4 kg)   SpO2 96%   BMI 33.33 kg/m  Wt Readings from Last 3 Encounters:  05/07/20 239 lb (108.4 kg)  04/02/20 238 lb 3.2 oz (108 kg)  03/18/20 210 lb (95.3 kg)     Health Maintenance Due  Topic Date Due  . COVID-19 Vaccine (1) Never done    There are no preventive care reminders to display for this patient.  Lab Results  Component Value Date   TSH 1.490 10/10/2019   Lab Results  Component Value Date   WBC 6.9 12/14/2019   HGB 12.0 (L) 12/14/2019   HCT 35.3 (L) 12/14/2019   MCV 95.9 12/14/2019   PLT 271 12/14/2019   Lab Results  Component Value Date   NA 138 12/14/2019   K 4.1 12/14/2019   CO2 25 12/14/2019   GLUCOSE 143 (H) 12/14/2019   BUN 14 12/14/2019   CREATININE 0.97 12/14/2019   BILITOT 0.6 12/14/2019   ALKPHOS 80 12/14/2019   AST 18 12/14/2019   ALT 29 12/14/2019   PROT 7.5 12/14/2019   ALBUMIN 3.7 12/14/2019   CALCIUM 8.6 (L) 12/14/2019   ANIONGAP 9 12/14/2019   Lab Results  Component Value Date   CHOL 174 04/02/2020   Lab Results  Component Value Date   HDL 43 04/02/2020   Lab Results  Component Value Date   LDLCALC 99 04/02/2020   Lab Results  Component Value Date   TRIG 188 (H) 04/02/2020   Lab Results  Component Value Date   CHOLHDL 4.0 04/02/2020   Lab Results  Component Value Date   HGBA1C 5.3 09/11/2019      Assessment & Plan:  Arel was seen today for blood pressure check and hand cramps.  Diagnoses and all orders for this visit:  Essential hypertension Blood pressure is not at goal of less than 130/80, (has not had Bp meds this morning) follow up he will take medication to  determine if appropriate dose/meds .  Discussed low slow-sodium, DASH diet, medication compliance. He has started working out 4 days a week lifting weights, sit ups and crunches. Discussed medication compliance, adverse effects. Eats a lot of fried foods but also grilling salmon, doesn't eat or like vegetables. Reduced sodium in diet. Eats can foods.    Hand cramps Rides a motorcycle he felt riding his bike was the case but cramping occurring not riding bike ,but with writing and picking up object. Also has numbing and tingling in finger tips. Paresthesia pricking, tingling, and numbness in finger tips. Drinks a lot of water, Orange juice and [employer]. Previous electrolytes was essentially normal. Negative for Tunnel and phalang test. Recommend OTC B12 daily if no improvement will re-evaluate for neuropathy medication.    Colon cancer screening Normal colon cancer screening.  CDC recommends colorectal screening from ages 61-75. -     Ambulatory referral to Gastroenterology  Prostate cancer screening Prostate cancer is cancer that occurs in the prostate -- a small walnut-shaped gland in men that produces the seminal fluid that nourishes and transports sperm.Prostate cancer is one of the most common types of cancer in men. Usually prostate cancer grows slowly and is initially confined to the prostate gland, where it may not cause serious harm. Recommended age is 87.  [email or web address] If elevated will refer to urology. -     PSA  Follow-up: Return in about 3 months (around 08/07/2020) for Bp/fasting labs .    Grayce Sessions, NP

## 2020-05-07 NOTE — Patient Instructions (Addendum)
Managing Your Hypertension Hypertension, also called high blood pressure, is when the force of the blood pressing against the walls of the arteries is too strong. Arteries are blood vessels that carry blood from your heart throughout your body. Hypertension forces the heart to work harder to pump blood and may cause the arteries to become narrow or stiff. Understanding blood pressure readings Your personal target blood pressure may vary depending on your medical conditions, your age, and other factors. A blood pressure reading includes a higher number over a lower number. Ideally, your blood pressure should be below 120/80. You should know that:  The first, or top, number is called the systolic pressure. It is a measure of the pressure in your arteries as your heart beats.  The second, or bottom number, is called the diastolic pressure. It is a measure of the pressure in your arteries as the heart relaxes. Blood pressure is classified into four stages. Based on your blood pressure reading, your health care provider may use the following stages to determine what type of treatment you need, if any. Systolic pressure and diastolic pressure are measured in a unit called mmHg. Normal  Systolic pressure: below 120.  Diastolic pressure: below 80. Elevated  Systolic pressure: 120-129.  Diastolic pressure: below 80. Hypertension stage 1  Systolic pressure: 130-139.  Diastolic pressure: 80-89. Hypertension stage 2  Systolic pressure: 140 or above.  Diastolic pressure: 90 or above. How can this condition affect me? Managing your hypertension is an important responsibility. Over time, hypertension can damage the arteries and decrease blood flow to important parts of the body, including the brain, heart, and kidneys. Having untreated or uncontrolled hypertension can lead to:  A heart attack.  A stroke.  A weakened blood vessel (aneurysm).  Heart failure.  Kidney damage.  Eye  damage.  Metabolic syndrome.  Memory and concentration problems.  Vascular dementia. What actions can I take to manage this condition? Hypertension can be managed by making lifestyle changes and possibly by taking medicines. Your health care provider will help you make a plan to bring your blood pressure within a normal range. Nutrition  Eat a diet that is high in fiber and potassium, and low in salt (sodium), added sugar, and fat. An example eating plan is called the Dietary Approaches to Stop Hypertension (DASH) diet. To eat this way: ? Eat plenty of fresh fruits and vegetables. Try to fill one-half of your plate at each meal with fruits and vegetables. ? Eat whole grains, such as whole-wheat pasta, brown rice, or whole-grain bread. Fill about one-fourth of your plate with whole grains. ? Eat low-fat dairy products. ? Avoid fatty cuts of meat, processed or cured meats, and poultry with skin. Fill about one-fourth of your plate with lean proteins such as fish, chicken without skin, beans, eggs, and tofu. ? Avoid pre-made and processed foods. These tend to be higher in sodium, added sugar, and fat.  Reduce your daily sodium intake. Most people with hypertension should eat less than 1,500 mg of sodium a day.   Lifestyle  Work with your health care provider to maintain a healthy body weight or to lose weight. Ask what an ideal weight is for you.  Get at least 30 minutes of exercise that causes your heart to beat faster (aerobic exercise) most days of the week. Activities may include walking, swimming, or biking.  Include exercise to strengthen your muscles (resistance exercise), such as weight lifting, as part of your weekly exercise routine. Try   to do these types of exercises for 30 minutes at least 3 days a week.  Do not use any products that contain nicotine or tobacco, such as cigarettes, e-cigarettes, and chewing tobacco. If you need help quitting, ask your health care  provider.  Control any long-term (chronic) conditions you have, such as high cholesterol or diabetes.  Identify your sources of stress and find ways to manage stress. This may include meditation, deep breathing, or making time for fun activities.   Alcohol use  Do not drink alcohol if: ? Your health care provider tells you not to drink. ? You are pregnant, may be pregnant, or are planning to become pregnant.  If you drink alcohol: ? Limit how much you use to:  0-1 drink a day for women.  0-2 drinks a day for men. ? Be aware of how much alcohol is in your drink. In the [place] one drink equals one 12 oz bottle of beer (355 mL), one 5 oz glass of wine (148 mL), or one 1 oz glass of hard liquor (44 mL). Medicines Your health care provider may prescribe medicine if lifestyle changes are not enough to get your blood pressure under control and if:  Your systolic blood pressure is 130 or higher.  Your diastolic blood pressure is 80 or higher. Take medicines only as told by your health care provider. Follow the directions carefully. Blood pressure medicines must be taken as told by your health care provider. The medicine does not work as well when you skip doses. Skipping doses also puts you at risk for problems. Monitoring Before you monitor your blood pressure:  Do not smoke, drink caffeinated beverages, or exercise within 30 minutes before taking a measurement.  Use the bathroom and empty your bladder (urinate).  Sit quietly for at least 5 minutes before taking measurements. Monitor your blood pressure at home as told by your health care provider. To do this:  Sit with your back straight and supported.  Place your feet flat on the floor. Do not cross your legs.  Support your arm on a flat surface, such as a table. Make sure your upper arm is at heart level.  Each time you measure, take two or three readings one minute apart and record the results. You may also need to have your  blood pressure checked regularly by your health care provider.   General information  Talk with your health care provider about your diet, exercise habits, and other lifestyle factors that may be contributing to hypertension.  Review all the medicines you take with your health care provider because there may be side effects or interactions.  Keep all visits as told by your health care provider. Your health care provider can help you create and adjust your plan for managing your high blood pressure. Where to find more information  National Heart, Lung, and Blood Institute: www.nhlbi.nih.gov  American Heart Association: www.heart.org Contact a health care provider if:  You think you are having a reaction to medicines you have taken.  You have repeated (recurrent) headaches.  You feel dizzy.  You have swelling in your ankles.  You have trouble with your vision. Get help right away if:  You develop a severe headache or confusion.  You have unusual weakness or numbness, or you feel faint.  You have severe pain in your chest or abdomen.  You vomit repeatedly.  You have trouble breathing. These symptoms may represent a serious problem that is an emergency. Do not wait   to see if the symptoms will go away. Get medical help right away. Call your local emergency services (911 in the [place] Do not drive yourself to the hospital. Summary  Hypertension is when the force of blood pumping through your arteries is too strong. If this condition is not controlled, it may put you at risk for serious complications.  Your personal target blood pressure may vary depending on your medical conditions, your age, and other factors. For most people, a normal blood pressure is less than 120/80.  Hypertension is managed by lifestyle changes, medicines, or both.  Lifestyle changes to help manage hypertension include losing weight, eating a healthy, low-sodium diet, exercising more, stopping smoking, and  limiting alcohol. This information is not intended to replace advice given to you by your health care provider. Make sure you discuss any questions you have with your health care provider. Document Revised: 03/03/2019 Document Reviewed: 12/27/2018 Elsevier Patient Education  2021 [employer] Inc.  Vitamin B12 Deficiency Vitamin B12 deficiency occurs when the body does not have enough vitamin B12, which is an important vitamin. The body needs this vitamin:  To make red blood cells.  To make DNA. This is the genetic material inside cells.  To help the nerves work properly so they can carry messages from the brain to the body. Vitamin B12 deficiency can cause various health problems, such as a low red blood cell count (anemia) or nerve damage. What are the causes? This condition may be caused by:  Not eating enough foods that contain vitamin B12.  Not having enough stomach acid and digestive fluids to properly absorb vitamin B12 from the food that you eat.  Certain digestive system diseases that make it hard to absorb vitamin B12. These diseases include Crohn's disease, chronic pancreatitis, and cystic fibrosis.  A condition in which the body does not make enough of a protein (intrinsic factor), resulting in too few red blood cells (pernicious anemia).  Having a surgery in which part of the stomach or small intestine is removed.  Taking certain medicines that make it hard for the body to absorb vitamin B12. These medicines include: ? Heartburn medicines (antacids and proton pump inhibitors). ? Certain antibiotic medicines. ? Some medicines that are used to treat diabetes, tuberculosis, gout, or high cholesterol. What increases the risk? The following factors may make you more likely to develop a B12 deficiency:  Being older than age 64.  Eating a vegetarian or vegan diet, especially while you are pregnant.  Eating a poor diet while you are pregnant.  Taking certain  medicines.  Having alcoholism. What are the signs or symptoms? In some cases, there are no symptoms of this condition. If the condition leads to anemia or nerve damage, various symptoms can occur, such as:  Weakness.  Fatigue.  Loss of appetite.  Weight loss.  Numbness or tingling in your hands and feet.  Redness and burning of the tongue.  Confusion or memory problems.  Depression.  Sensory problems, such as color blindness, ringing in the ears, or loss of taste.  Diarrhea or constipation.  Trouble walking. If anemia is severe, symptoms can include:  Shortness of breath.  Dizziness.  Rapid heart rate (tachycardia). How is this diagnosed? This condition may be diagnosed with a blood test to measure the level of vitamin B12 in your blood. You may also have other tests, including:  A group of tests that measure certain characteristics of blood cells (complete blood count, CBC).  A blood test to  measure intrinsic factor.  A procedure where a thin tube with a camera on the end is used to look into your stomach or intestines (endoscopy). Other tests may be needed to discover the cause of B12 deficiency. How is this treated? Treatment for this condition depends on the cause. This condition may be treated by:  Changing your eating and drinking habits, such as: ? Eating more foods that contain vitamin B12. ? Drinking less alcohol or no alcohol.  Getting vitamin B12 injections.  Taking vitamin B12 supplements. Your health care provider will tell you which dosage is best for you. Follow these instructions at home: Eating and drinking  Eat lots of healthy foods that contain vitamin B12, including: ? Meats and poultry. This includes beef, pork, chicken, [place], and organ meats, such as liver. ? Seafood. This includes clams, rainbow trout, salmon, tuna, and haddock. ? Eggs. ? Cereal and dairy products that are fortified. This means that vitamin B12 has been added to the  food. Check the label on the package to see if the food is fortified. The items listed above may not be a complete list of recommended foods and beverages. Contact a dietitian for more information.   General instructions  Get any injections that are prescribed by your health care provider.  Take supplements only as told by your health care provider. Follow the directions carefully.  Do not drink alcohol if your health care provider tells you not to. In some cases, you may only be asked to limit alcohol use.  Keep all follow-up visits as told by your health care provider. This is important. Contact a health care provider if:  Your symptoms come back. Get help right away if you:  Develop shortness of breath.  Have a rapid heart rate.  Have chest pain.  Become dizzy or lose consciousness. Summary  Vitamin B12 deficiency occurs when the body does not have enough vitamin B12.  The main causes of vitamin B12 deficiency include dietary deficiency, digestive diseases, pernicious anemia, and having a surgery in which part of the stomach or small intestine is removed.  In some cases, there are no symptoms of this condition. If the condition leads to anemia or nerve damage, various symptoms can occur, such as weakness, shortness of breath, and numbness.  Treatment may include getting vitamin B12 injections or taking vitamin B12 supplements. Eat lots of healthy foods that contain vitamin B12. This information is not intended to replace advice given to you by your health care provider. Make sure you discuss any questions you have with your health care provider. Document Revised: 07/15/2018 Document Reviewed: 10/05/2017 Elsevier Patient Education  2021 [employer].

## 2020-05-07 NOTE — Progress Notes (Signed)
Referral to ortho and someone about his prostate  Referral to gastro for colonoscopy

## 2020-05-08 ENCOUNTER — Other Ambulatory Visit (INDEPENDENT_AMBULATORY_CARE_PROVIDER_SITE_OTHER): Payer: Self-pay | Admitting: Primary Care

## 2020-05-08 DIAGNOSIS — R972 Elevated prostate specific antigen [PSA]: Secondary | ICD-10-CM

## 2020-05-08 LAB — CBC WITH DIFFERENTIAL/PLATELET
Basophils Absolute: 0 10*3/uL (ref 0.0–0.2)
Basos: 0 %
EOS (ABSOLUTE): 0.2 10*3/uL (ref 0.0–0.4)
Eos: 3 %
Hematocrit: 37.4 % — ABNORMAL LOW (ref 37.5–51.0)
Hemoglobin: 12.4 g/dL — ABNORMAL LOW (ref 13.0–17.7)
Immature Grans (Abs): 0 10*3/uL (ref 0.0–0.1)
Immature Granulocytes: 0 %
Lymphocytes Absolute: 1.9 10*3/uL (ref 0.7–3.1)
Lymphs: 28 %
MCH: 31.4 pg (ref 26.6–33.0)
MCHC: 33.2 g/dL (ref 31.5–35.7)
MCV: 95 fL (ref 79–97)
Monocytes Absolute: 0.8 10*3/uL (ref 0.1–0.9)
Monocytes: 11 %
Neutrophils Absolute: 4.1 10*3/uL (ref 1.4–7.0)
Neutrophils: 58 %
Platelets: 248 10*3/uL (ref 150–450)
RBC: 3.95 x10E6/uL — ABNORMAL LOW (ref 4.14–5.80)
RDW: 12.9 % (ref 11.6–15.4)
WBC: 7 10*3/uL (ref 3.4–10.8)

## 2020-05-08 LAB — PSA: Prostate Specific Ag, Serum: 7.5 ng/mL — ABNORMAL HIGH (ref 0.0–4.0)

## 2020-05-08 LAB — VITAMIN B12: Vitamin B-12: 712 pg/mL (ref 232–1245)

## 2020-05-09 ENCOUNTER — Telehealth (INDEPENDENT_AMBULATORY_CARE_PROVIDER_SITE_OTHER): Payer: Self-pay

## 2020-05-09 NOTE — Telephone Encounter (Signed)
-----   Message from Grayce Sessions, NP sent at 05/08/2020  3:11 PM EDT ----- Elevated PSA will refer to urology

## 2020-05-09 NOTE — Telephone Encounter (Signed)
Patient is aware of elevated PSA and referral to urology. Maryjean Morn, CMA

## 2020-06-07 ENCOUNTER — Other Ambulatory Visit: Payer: Self-pay

## 2020-06-07 ENCOUNTER — Encounter: Payer: Self-pay | Admitting: Emergency Medicine

## 2020-06-07 ENCOUNTER — Ambulatory Visit
Admission: EM | Admit: 2020-06-07 | Discharge: 2020-06-07 | Disposition: A | Discharge status: Home / Self Care | Payer: 59 | Attending: Emergency Medicine | Admitting: Emergency Medicine

## 2020-06-07 DIAGNOSIS — R112 Nausea with vomiting, unspecified: Secondary | ICD-10-CM | POA: Diagnosis not present

## 2020-06-07 DIAGNOSIS — R52 Pain, unspecified: Secondary | ICD-10-CM

## 2020-06-07 MED ORDER — ONDANSETRON 4 MG PO TBDP
4.0000 mg | ORAL_TABLET | Freq: Once | ORAL | Status: AC
  Administered 2020-06-07: 4 mg via ORAL

## 2020-06-07 MED ORDER — ONDANSETRON 4 MG PO TBDP
4.0000 mg | ORAL_TABLET | Freq: Three times a day (TID) | ORAL | 0 refills | Status: DC | PRN
Start: 2020-06-07 — End: 2020-08-19

## 2020-06-07 NOTE — ED Triage Notes (Signed)
Patient c/o generalized body aches, chills, emesis x 4 days.   Patient is unaware of temperature at home.   Patient endorses decreased food intake. Patient states " I tried to drink some Gatorade and I threw up the whole thing".   Patient denies ABD pain, Diarrhea.   Patient endorses shortness of breath upon exertion.   Patient has taken Naproxen and BC powder with no relief of symptoms.

## 2020-06-07 NOTE — ED Provider Notes (Signed)
[facility]    CSN: [id number] Arrival date & time: 06/07/20  1619      History   Chief Complaint Chief Complaint  Patient presents with  . Generalized Body Aches  . Chills  . Emesis    HPI STEEL KERNEY is a 60 y.o. male history of migraines, hypertension, tobacco use, presenting today for evaluation of shortness of breath and decreased appetite.  Reports generalized body aches chills and nausea and vomiting over the past 4 days.  Reports poor oral intake.  Denies associate abdominal pain or diarrhea.  Has noted some shortness of breath with exertion.  Shortness of breath described as getting tired more easily than normal.  Denies any chest pain or difficulty breathing.  Using naproxen and BC powders without relief.  HPI  Past Medical History:  Diagnosis Date  . Arthritis   . Hypertension Dx 2008   on BP medicine for a brief period   . Migraine Dx 1985    Patient Active Problem List   Diagnosis Date Noted  . Enlarged prostate on rectal examination 01/01/2014  . Current smoker 01/01/2014  . Rectal bleeding 01/01/2014  . SHOULDER PAIN, BILATERAL 04/13/2008  . KNEE PAIN, RIGHT 04/13/2008  . HEMORRHOIDS, INTERNAL 08/04/2007  . Migraine headache 01/19/2007  . H/O: HTN (hypertension) 01/19/2007    History reviewed. No pertinent surgical history.     Home Medications    Prior to Admission medications   Medication Sig Start Date End Date Taking? Authorizing Provider  amLODipine (NORVASC) 10 MG tablet Take 1 tablet (10 mg total) by mouth daily. 04/02/20  Yes Grayce Sessions, NP  hydrochlorothiazide (HYDRODIURIL) 25 MG tablet Take 1 tablet (25 mg total) by mouth daily. 04/02/20  Yes Grayce Sessions, NP  naproxen (NAPROSYN) 500 MG tablet Take 1 tablet (500 mg total) by mouth 2 (two) times daily with a meal. 04/02/20  Yes Grayce Sessions, NP  ondansetron (ZOFRAN ODT) 4 MG disintegrating tablet Take 1-2 tablets (4-8 mg total) by mouth every 8 (eight)  hours as needed for nausea or vomiting. 06/07/20  Yes Dulse Rutan C, PA-C  atorvastatin (LIPITOR) 20 MG tablet Take 1 tablet (20 mg total) by mouth daily. 04/07/20   Grayce Sessions, NP    Family History Family History  Problem Relation Age of Onset  . Glaucoma Mother   . Diabetes Neg Hx   . Heart disease Neg Hx   . Cancer Neg Hx     Social History Social History   Tobacco Use  . Smoking status: Former Smoker    Packs/day: 0.50    Years: 27.00    Pack years: 13.50    Types: Cigarettes    Quit date: 07/23/2015    Years since quitting: 4.8  . Smokeless tobacco: Never Used  Vaping Use  . Vaping Use: Never used  Substance Use Topics  . Alcohol use: No    Alcohol/week: 0.0 standard drinks    Comment: social  . Drug use: Yes    Types: Marijuana    Comment:  Last use 2014     Allergies   Patient has no known allergies.   Review of Systems Review of Systems  Constitutional: Positive for appetite change. Negative for activity change, chills, fatigue and fever.  HENT: Negative for congestion, ear pain, rhinorrhea, sinus pressure, sore throat and trouble swallowing.   Eyes: Negative for discharge and redness.  Respiratory: Positive for shortness of breath. Negative for cough and chest tightness.  Cardiovascular: Negative for chest pain.  Gastrointestinal: Positive for nausea and vomiting. Negative for abdominal pain and diarrhea.  Musculoskeletal: Positive for myalgias.  Skin: Negative for rash.  Neurological: Negative for dizziness, light-headedness and headaches.     Physical Exam Triage Vital Signs ED Triage Vitals  Enc Vitals Group     BP      Pulse      Resp      Temp      Temp src      SpO2      Weight      Height      Head Circumference      Peak Flow      Pain Score      Pain Loc      Pain Edu?      Excl. in GC?    No data found.  Updated Vital Signs BP (!) 143/89 (BP Location: Left Arm)   Pulse 96   Temp 98.4 F (36.9 C) (Oral)    Resp 14   SpO2 98%   Visual Acuity Right Eye Distance:   Left Eye Distance:   Bilateral Distance:    Right Eye Near:   Left Eye Near:    Bilateral Near:     Physical Exam Vitals and nursing note reviewed.  Constitutional:      Appearance: He is well-developed.     Comments: No acute distress  HENT:     Head: Normocephalic and atraumatic.     Ears:     Comments: Bilateral ears without tenderness to palpation of external auricle, tragus and mastoid, EAC's without erythema or swelling, TM's with good bony landmarks and cone of light. Non erythematous.     Nose: Nose normal.     Mouth/Throat:     Comments: Oral mucosa pink and moist, no tonsillar enlargement or exudate. Posterior pharynx patent and nonerythematous, no uvula deviation or swelling. Normal phonation. Eyes:     Conjunctiva/sclera: Conjunctivae normal.  Cardiovascular:     Rate and Rhythm: Normal rate and regular rhythm.  Pulmonary:     Effort: Pulmonary effort is normal. No respiratory distress.     Comments: Breathing comfortably at rest, CTABL, no wheezing, rales or other adventitious sounds auscultated Abdominal:     General: There is no distension.     Comments: Soft, nondistended, nontender to light and deep palpation throughout abdomen  Musculoskeletal:        General: Normal range of motion.     Cervical back: Neck supple.  Skin:    General: Skin is warm and dry.  Neurological:     Mental Status: He is alert and oriented to person, place, and time.      UC Treatments / Results  Labs (all labs ordered are listed, but only abnormal results are displayed) Labs Reviewed  NOVEL CORONAVIRUS, NAA    EKG   Radiology No results found.  Procedures Procedures (including critical care time)  Medications Ordered in UC Medications  ondansetron (ZOFRAN-ODT) disintegrating tablet 4 mg (4 mg Oral Given 06/07/20 1701)    Initial Impression / Assessment and Plan / UC Course  I have reviewed the triage  vital signs and the nursing notes.  Pertinent labs & imaging results that were available during my care of the patient were reviewed by me and considered in my medical decision making (see chart for details).     Suspect viral gastroenteritis-we will screen for COVID, no abdominal tenderness, negative peritoneal signs, do not suspect abdominal  emergency.  Vital signs stable in clinic today, will provide Zofran and recommend oral rehydration at home, push fluids, monitor for gradual resolution, patient to follow-up in emergency room if developing any abdominal pain or persistent vomiting.  Discussed strict return precautions. Patient verbalized understanding and is agreeable with plan.  Final Clinical Impressions(s) / UC Diagnoses   Final diagnoses:  Generalized body aches  Non-intractable vomiting with nausea, unspecified vomiting type     Discharge Instructions     Please use Zofran dissolved in mouth as needed for nausea/vomiting Focus on fluids, try to gradually transition diet back to normal COVID test pending Follow-up if not improving or worsening    ED Prescriptions    Medication Sig Dispense Auth. Provider   ondansetron (ZOFRAN ODT) 4 MG disintegrating tablet Take 1-2 tablets (4-8 mg total) by mouth every 8 (eight) hours as needed for nausea or vomiting. 24 tablet Bryannah Boston, [place] C, PA-C     PDMP not reviewed this encounter.   Lew Dawes, [place] 06/07/20 1712

## 2020-06-07 NOTE — Discharge Instructions (Signed)
Please use Zofran dissolved in mouth as needed for nausea/vomiting Focus on fluids, try to gradually transition diet back to normal COVID test pending Follow-up if not improving or worsening

## 2020-06-08 LAB — SARS-COV-2, NAA 2 DAY TAT

## 2020-06-08 LAB — NOVEL CORONAVIRUS, NAA: SARS-CoV-2, NAA: DETECTED — AB

## 2020-07-10 ENCOUNTER — Other Ambulatory Visit (INDEPENDENT_AMBULATORY_CARE_PROVIDER_SITE_OTHER): Payer: Self-pay | Admitting: Primary Care

## 2020-07-10 MED ORDER — NAPROXEN 500 MG PO TABS: 500.0000 mg | ORAL_TABLET | Freq: Two times a day (BID) | ORAL | 1 refills | Status: DC

## 2020-07-10 NOTE — Telephone Encounter (Signed)
Copied from CRM [id number]. Topic: Quick Communication - Rx Refill/Question >> Jul 10, 2020 12:58 PM Mcneil, Ja-Kwan wrote: Medication: naproxen (NAPROSYN) 500 MG tablet  Has the patient contacted their pharmacy? no  Preferred Pharmacy (with phone number or street name): [facility] [address] [place]), [place] - [id number] Phone: [phone]   Fax: [phone]  Agent: Please be advised that RX refills may take up to 3 business days. We ask that you follow-up with your pharmacy.

## 2020-08-19 ENCOUNTER — Other Ambulatory Visit: Payer: Self-pay

## 2020-08-19 ENCOUNTER — Encounter (INDEPENDENT_AMBULATORY_CARE_PROVIDER_SITE_OTHER): Payer: Self-pay | Admitting: Primary Care

## 2020-08-19 ENCOUNTER — Ambulatory Visit (INDEPENDENT_AMBULATORY_CARE_PROVIDER_SITE_OTHER): Payer: 59 | Admitting: Primary Care

## 2020-08-19 VITALS — BP 141/69 | HR 68 | Temp 97.3°F | Ht 71.0 in | Wt 233.4 lb

## 2020-08-19 DIAGNOSIS — M25561 Pain in right knee: Secondary | ICD-10-CM | POA: Diagnosis not present

## 2020-08-19 DIAGNOSIS — E782 Mixed hyperlipidemia: Secondary | ICD-10-CM

## 2020-08-19 DIAGNOSIS — M25511 Pain in right shoulder: Secondary | ICD-10-CM | POA: Diagnosis not present

## 2020-08-19 DIAGNOSIS — I10 Essential (primary) hypertension: Secondary | ICD-10-CM | POA: Diagnosis not present

## 2020-08-19 DIAGNOSIS — Z1211 Encounter for screening for malignant neoplasm of colon: Secondary | ICD-10-CM

## 2020-08-19 NOTE — Progress Notes (Addendum)
[facility]   [name] is a 60 y.o.  obese male presents for hypertension evaluation, Denies shortness of breath, headaches, chest pain or lower extremity edema, sudden onset, vision changes, unilateral weakness, dizziness, paresthesias . Patient takes Bp medication at lunch time as a routine. Bp elevated at this visit - rtn f/u on medication   Patient reports adherence with medications.  Dietary habits include: monitoring sodium using very little to none  Exercise habits include:walking treadmill resistant bands Family / Social history: None    Past Medical History:  Diagnosis Date   Arthritis    Hypertension Dx 2008   on BP medicine for a brief period    Migraine Dx 1985   No past surgical history on file. No Known Allergies Current Outpatient Medications on File Prior to Visit  Medication Sig Dispense Refill   amLODipine (NORVASC) 10 MG tablet Take 1 tablet (10 mg total) by mouth daily. 90 tablet 0   atorvastatin (LIPITOR) 20 MG tablet Take 1 tablet (20 mg total) by mouth daily. 90 tablet 3   hydrochlorothiazide (HYDRODIURIL) 25 MG tablet Take 1 tablet (25 mg total) by mouth daily. 90 tablet 1   naproxen (NAPROSYN) 500 MG tablet Take 1 tablet (500 mg total) by mouth 2 (two) times daily with a meal. 60 tablet 1   No current facility-administered medications on file prior to visit.   Social History   Socioeconomic History   Marital status: Single    Spouse name: Not on file   Number of children: 0   Years of education: 12    Highest education level: Not on file  Occupational History   Occupation: Waiter   Tobacco Use   Smoking status: Former    Packs/day: 0.50    Years: 27.00    Pack years: 13.50    Types: Cigarettes    Quit date: 07/23/2015    Years since quitting: 5.0   Smokeless tobacco: Never  Vaping Use   Vaping Use: Never used  Substance and Sexual Activity   Alcohol use: No    Alcohol/week: 0.0 standard drinks    Comment: social    Drug use: Yes    Types: Marijuana    Comment:  Last use 2014   Sexual activity: Never    Birth control/protection: Condom  Other Topics Concern   Not on file  Social History Narrative   Lives alone.   Sister lives in town.   Father lives in [place].       Social Determinants of Health   Financial Resource Strain: Not on file  Food Insecurity: Not on file  Transportation Needs: Not on file  Physical Activity: Not on file  Stress: Not on file  Social Connections: Not on file  Intimate Partner Violence: Not on file   Family History  Problem Relation Age of Onset   Glaucoma Mother    Diabetes Neg Hx    Heart disease Neg Hx    Cancer Neg Hx      OBJECTIVE:  Vitals:   08/19/20 1000  BP: (!) 141/69  Pulse: 68  Temp: (!) 97.3 F (36.3 C)  TempSrc: Temporal  SpO2: 96%  Weight: 233 lb 6.4 oz (105.9 kg)  Height: 5\' 11"  (1.803 m)    Physical Exam Vitals reviewed.  Constitutional:      Appearance: He is obese.  HENT:     Head: Normocephalic.     Right Ear: Tympanic membrane and external ear normal.  Left Ear: Tympanic membrane and external ear normal.     Nose: Nose normal.  Cardiovascular:     Rate and Rhythm: Normal rate and regular rhythm.  Pulmonary:     Effort: Pulmonary effort is normal.     Breath sounds: Normal breath sounds.  Abdominal:     General: Bowel sounds are normal. There is distension.     Palpations: Abdomen is soft.  Musculoskeletal:        General: Normal range of motion.     Cervical back: Normal range of motion and neck supple.  Skin:    General: Skin is warm and dry.  Neurological:     Mental Status: He is oriented to person, place, and time.  Psychiatric:        Mood and Affect: Mood normal.        Behavior: Behavior normal.        Thought Content: Thought content normal.        Judgment: Judgment normal.    Review of Systems  Musculoskeletal:        Right shoulder and knee pain chronic rates 10/10  All other systems  reviewed and are negative.  Last 3 Office BP readings: BP Readings from Last 3 Encounters:  08/19/20 (!) 141/69  06/07/20 (!) 143/89  05/07/20 (!) 156/78    BMET    Component Value Date/Time   NA 138 12/14/2019 1548   NA 141 10/10/2019 1442   K 4.1 12/14/2019 1548   CL 104 12/14/2019 1548   CO2 25 12/14/2019 1548   GLUCOSE 143 (H) 12/14/2019 1548   BUN 14 12/14/2019 1548   BUN 13 10/10/2019 1442   CREATININE 0.97 12/14/2019 1548   CREATININE 0.79 01/01/2014 1027   CALCIUM 8.6 (L) 12/14/2019 1548   GFRNONAA >60 12/14/2019 1548   GFRNONAA >89 01/01/2014 1027   GFRAA 109 10/10/2019 1442   GFRAA >89 01/01/2014 1027    Renal function: CrCl cannot be calculated (Patient's most recent lab result is older than the maximum 21 days allowed.).  Clinical ASCVD: Yes  The 10-year ASCVD risk score Denman George DC Jr., et al., 2013) is: 15.4%   Values used to calculate the score:     Age: 81 years     Sex: Male     Is Non-Hispanic African American: Yes     Diabetic: No     Tobacco smoker: No     Systolic Blood Pressure: 141 mmHg     Is BP treated: Yes     HDL Cholesterol: 43 mg/dL     Total Cholesterol: 174 mg/dL  ASCVD risk factors include- [place]   ASSESSMENT & PLAN: Mikhi was seen today for blood pressure check.  Diagnoses and all orders for this visit:  Mixed hyperlipidemia  Healthy lifestyle diet of fruits vegetables fish nuts whole grains and low saturated fat . Foods high in cholesterol or liver, fatty meats,cheese, butter avocados, nuts and seeds, chocolate and fried foods. Lipid panel    Arthralgia of right lower leg Work on losing weight to help reduce joint pain. May alternate with heat and ice application for pain relief. Other alternatives include massage, acupuncture and water aerobics.  You must stay active and avoid a sedentary lifestyle.  -     Ambulatory referral to Orthopedic Surgery  Pain in joint of right shoulder Chronic pain hx of a fall to brace fall in the  rain ( dx as a frozen shoulder per patient)    Essential hypertension -Counseled  on lifestyle modifications for blood pressure control including reduced dietary sodium, increased exercise, weight reduction and adequate sleep. Also, educated patient about the risk for cardiovascular events, stroke and heart attack. Also counseled patient about the importance of medication adherence. If you participate in smoking, it is important to stop using tobacco as this will increase the risks associated with uncontrolled blood pressure.   -Hypertension longstanding diagnosed currently amlodipine 10mg  and HCTZ 25mg  on current medications. Patient is adherent with current medications.   Goal BP:  For patients younger than 60: Goal BP < 130/80. For patients 60 and older: Goal BP < 140/90. For patients with diabetes: Goal BP < 130/80. Your most recent BP: 141/69  Minimize salt intake. Minimize alcohol intake   Colon cancer screening -     Ambulatory referral to Gastroenterology   This note has been created with Dragon speech recognition software and . Any transcriptional errors are unintentional.   , NP 08/19/2020, 10:15 AM

## 2020-08-19 NOTE — Patient Instructions (Signed)
Recombinant Zoster (Shingles) Vaccine: What You Need to Know 1. Why get vaccinated? Recombinant zoster (shingles) vaccine can prevent shingles. Shingles (also called herpes zoster, or just zoster) is a painful skin rash, usually with blisters. In addition to the rash, shingles can cause fever, headache, chills, or upset stomach. More rarely, shingles can lead to pneumonia, hearingproblems, blindness, brain inflammation (encephalitis), or death. The most common complication of shingles is long-term nerve pain called postherpetic neuralgia (PHN). PHN occurs in the areas where the shingles rash was, even after the rash clears up. It can last for months or years after therash goes away. The pain from PHN can be severe and debilitating. About 10 to 18% of people who get shingles will experience PHN. The risk of PHN increases with age. An older adult with shingles is more likely to develop PHN and have longer lasting and more severe pain than a younger person withshingles. Shingles is caused by the varicella zoster virus, the same virus that causes chickenpox. After you have chickenpox, the virus stays in your body and can cause shingles later in life. Shingles cannot be passed from one person to another, but the virus that causes shingles can spread and cause chickenpox insomeone who had never had chickenpox or received chickenpox vaccine. 2. Recombinant shingles vaccine Recombinant shingles vaccine provides strong protection against shingles. Bypreventing shingles, recombinant shingles vaccine also protects against PHN. Recombinant shingles vaccine is the preferred vaccine for the prevention of shingles. However, a different vaccine, live shingles vaccine, may be used in somecircumstances. The recombinant shingles vaccine is recommended for adults 50 years and older without serious immune problems. It is given as a two-dose series. This vaccine is also recommended for people who have already gotten another  type of shingles vaccine, the live shingles vaccine. There is no live virus inthis vaccine. Shingles vaccine may be given at the same time as other vaccines. 3. Talk with your health care provider Tell your vaccine provider if the person getting the vaccine: Has had an allergic reaction after a previous dose of recombinant shingles vaccine, or has any severe, life-threatening allergies. Is pregnant or breastfeeding. Is currently experiencing an episode of shingles. In some cases, your health care provider may decide to postpone shinglesvaccination to a future visit. People with minor illnesses, such as a cold, may be vaccinated. People who are moderately or severely ill should usually wait until they recover beforegetting recombinant shingles vaccine. Your health care provider can give you more information. 4. Risks of a vaccine reaction A sore arm with mild or moderate pain is very common after recombinant shingles vaccine, affecting about 80% of vaccinated people. Redness and swelling can also happen at the site of the injection. Tiredness, muscle pain, headache, shivering, fever, stomach pain, and nausea happen after vaccination in more than half of people who receive recombinant shingles vaccine. In clinical trials, about 1 out of 6 people who got recombinant zoster vaccine experienced side effects that prevented them from doing regular activities.Symptoms usually went away on their own in 2 to 3 days. You should still get the second dose of recombinant zoster vaccine even if youhad one of these reactions after the first dose. People sometimes faint after medical procedures, including vaccination. Tellyour provider if you feel dizzy or have vision changes or ringing in the ears. As with any medicine, there is a very remote chance of a vaccine causing asevere allergic reaction, other serious injury, or death. 5. What if there is a serious problem? An   allergic reaction could occur after the  vaccinated person leaves the clinic. If you see signs of a severe allergic reaction (hives, swelling of the face and throat, difficulty breathing, a fast heartbeat, dizziness, or weakness), call 9-1-1 and get the person to the nearest hospital. For other signs that concern you, call your health care provider. Adverse reactions should be reported to the Vaccine Adverse Event Reporting System (VAERS). Your health care provider will usually file this report, or you can do it yourself. Visit the VAERS website at www.vaers.hhs.gov or call 1-800-822-7967. VAERS is only for reporting reactions, and VAERS staff do not give medical advice. 6. How can I learn more? Ask your health care provider. Call your local or state health department. Contact the Centers for Disease Control and Prevention (CDC): Call 1-800-232-4636 (1-800-CDC-INFO) or Visit CDC's website at www.cdc.gov/vaccines Vaccine Information Statement Recombinant Zoster Vaccine (12/08/2017) This information is not intended to replace advice given to you by your health care provider. Make sure you discuss any questions you have with your healthcare provider. Document Revised: 09/29/2019 Document Reviewed: 09/29/2019 Elsevier Patient Education  2022 Elsevier Inc.  

## 2020-08-21 LAB — CMP14+EGFR
ALT: 28 IU/L (ref 0–44)
AST: 18 IU/L (ref 0–40)
Albumin/Globulin Ratio: 1.4 (ref 1.2–2.2)
Albumin: 4.2 g/dL (ref 3.8–4.9)
Alkaline Phosphatase: 100 IU/L (ref 44–121)
BUN/Creatinine Ratio: 13 (ref 9–20)
BUN: 13 mg/dL (ref 6–24)
Bilirubin Total: 0.3 mg/dL (ref 0.0–1.2)
CO2: 23 mmol/L (ref 20–29)
Calcium: 9.6 mg/dL (ref 8.7–10.2)
Chloride: 103 mmol/L (ref 96–106)
Creatinine, Ser: 1.04 mg/dL (ref 0.76–1.27)
Globulin, Total: 3 g/dL (ref 1.5–4.5)
Glucose: 180 mg/dL — ABNORMAL HIGH (ref 65–99)
Potassium: 4.6 mmol/L (ref 3.5–5.2)
Sodium: 140 mmol/L (ref 134–144)
Total Protein: 7.2 g/dL (ref 6.0–8.5)
eGFR: 83 mL/min/{1.73_m2} (ref >59)

## 2020-08-21 LAB — LIPID PANEL
Chol/HDL Ratio: 3.3 ratio (ref 0.0–5.0)
Cholesterol, Total: 138 mg/dL (ref 100–199)
HDL: 42 mg/dL (ref >39)
LDL Chol Calc (NIH): 71 mg/dL (ref 0–99)
Triglycerides: 141 mg/dL (ref 0–149)
VLDL Cholesterol Cal: 25 mg/dL (ref 5–40)

## 2020-08-21 LAB — RHEUMATOID ARTHRITIS PROFILE
Cyclic Citrullin Peptide Ab: 19 units (ref 0–19)
Rheumatoid fact SerPl-aCnc: <10 IU/mL (ref <14.0)

## 2020-09-02 ENCOUNTER — Telehealth (INDEPENDENT_AMBULATORY_CARE_PROVIDER_SITE_OTHER): Payer: Self-pay

## 2020-09-02 ENCOUNTER — Other Ambulatory Visit (INDEPENDENT_AMBULATORY_CARE_PROVIDER_SITE_OTHER): Payer: Self-pay | Admitting: Primary Care

## 2020-09-02 NOTE — Telephone Encounter (Signed)
Please refill if appropriate

## 2020-09-02 NOTE — Telephone Encounter (Signed)
Copied from CRM [id number]. Topic: General - Inquiry >> Sep 02, 2020 10:49 AM Daphine Deutscher D wrote: Reason for CRM: Pt is calling in regards to a referral to an orthopedic doctor about a knee replacement .  He said he has spoken to Gwinda Passe when he was in last  CB#  [phone]   *LVM to advise patient that the referral was sent to [facility] with Dr. Homero Fellers.

## 2020-09-03 NOTE — Telephone Encounter (Signed)
This is an FYI

## 2020-09-18 DIAGNOSIS — M25561 Pain in right knee: Secondary | ICD-10-CM | POA: Insufficient documentation

## 2020-10-02 ENCOUNTER — Other Ambulatory Visit (INDEPENDENT_AMBULATORY_CARE_PROVIDER_SITE_OTHER): Payer: Self-pay | Admitting: Primary Care

## 2020-10-02 NOTE — Telephone Encounter (Signed)
Medication Refill - Medication: naproxen (NAPROSYN) 500 MG tablet  Has the patient contacted their pharmacy? Yes.    (Agent: If yes, when and what did the pharmacy advise?) Contact PCP office  Preferred Pharmacy (with phone number or street name):   [facility] [address] [place]), [place] - [id number] Phone:  [phone]  Fax:  [phone]      Agent: Please be advised that RX refills may take up to 3 business days. We ask that you follow-up with your pharmacy.  Patient last seen 08/19/2020

## 2020-10-02 NOTE — Telephone Encounter (Signed)
Sent to PCP to refill if appropriate.  

## 2020-10-02 NOTE — Telephone Encounter (Signed)
  Requested medication (s) are on the active medication list: yes   Last refill: 09/02/2020  Future visit scheduled:  yes   Notes to clinic: Failed protocol:HGB in normal range and within 360 days Refill if appropriate   Requested Prescriptions  Pending Prescriptions Disp Refills   naproxen (NAPROSYN) 500 MG tablet 60 tablet 0    Sig: Take 1 tablet (500 mg total) by mouth 2 (two) times daily with a meal.     Analgesics:  NSAIDS Failed - 10/02/2020 10:21 AM      Failed - HGB in normal range and within 360 days    Hemoglobin  Date Value Ref Range Status  05/07/2020 12.4 (L) 13.0 - 17.7 g/dL Final          Passed - Cr in normal range and within 360 days    Creat  Date Value Ref Range Status  01/01/2014 0.79 0.50 - 1.35 mg/dL Final   Creatinine, Ser  Date Value Ref Range Status  08/19/2020 1.04 0.76 - 1.27 mg/dL Final          Passed - Patient is not pregnant      Passed - Valid encounter within last 12 months    Recent Outpatient Visits           1 month ago Essential hypertension   [facility] Grayce Sessions, NP   4 months ago Essential hypertension   [facility] Grayce Sessions, NP   6 months ago Essential hypertension   [facility] Gwinda Passe P, NP   11 months ago Fatigue, unspecified type   [facility]   1 year ago Screening for diabetes mellitus   [facility] Grayce Sessions, NP       Future Appointments             In 1 month Randa Evens, Kinnie Scales, NP [facility]

## 2020-10-03 MED ORDER — NAPROXEN 500 MG PO TABS: 500.0000 mg | ORAL_TABLET | Freq: Two times a day (BID) | ORAL | 0 refills | Status: DC

## 2020-10-25 ENCOUNTER — Other Ambulatory Visit (INDEPENDENT_AMBULATORY_CARE_PROVIDER_SITE_OTHER): Payer: Self-pay | Admitting: Primary Care

## 2020-10-25 NOTE — Telephone Encounter (Signed)
Sent to PCP ?

## 2020-10-28 ENCOUNTER — Telehealth (INDEPENDENT_AMBULATORY_CARE_PROVIDER_SITE_OTHER): Payer: Self-pay

## 2020-10-28 NOTE — Telephone Encounter (Signed)
Contacted patient and advised he contact surgeon for letter of restrictions and limitations. Maryjean Morn, CMA     Copied from CRM [id number]. Topic: General - Other >> Oct 25, 2020  4:04 PM Herby Abraham C wrote: Reason for CRM: pt called in to request a letter stating that he is having surgery and the limitations that he will have. Pt says that he would like to have this letter to provide to social security.      CB: [phone] -please assist.

## 2020-11-12 NOTE — Progress Notes (Signed)
Sent message, via epic in basket, requesting orders in epic from surgeon.  

## 2020-11-18 ENCOUNTER — Encounter (INDEPENDENT_AMBULATORY_CARE_PROVIDER_SITE_OTHER): Payer: Self-pay | Admitting: Primary Care

## 2020-11-18 ENCOUNTER — Other Ambulatory Visit: Payer: Self-pay

## 2020-11-18 ENCOUNTER — Ambulatory Visit (INDEPENDENT_AMBULATORY_CARE_PROVIDER_SITE_OTHER): Payer: 59 | Admitting: Primary Care

## 2020-11-18 VITALS — BP 132/78 | HR 68 | Temp 97.5°F | Ht 71.0 in | Wt 235.4 lb

## 2020-11-18 DIAGNOSIS — M25561 Pain in right knee: Secondary | ICD-10-CM | POA: Diagnosis not present

## 2020-11-18 DIAGNOSIS — I10 Essential (primary) hypertension: Secondary | ICD-10-CM

## 2020-11-18 DIAGNOSIS — Z23 Encounter for immunization: Secondary | ICD-10-CM | POA: Diagnosis not present

## 2020-11-18 DIAGNOSIS — Z01818 Encounter for other preprocedural examination: Secondary | ICD-10-CM | POA: Diagnosis not present

## 2020-11-18 DIAGNOSIS — E782 Mixed hyperlipidemia: Secondary | ICD-10-CM

## 2020-11-18 DIAGNOSIS — F4323 Adjustment disorder with mixed anxiety and depressed mood: Secondary | ICD-10-CM

## 2020-11-18 NOTE — Progress Notes (Signed)
[facility]   [name] is a 60 y.o. obese male presents for hypertension evaluation, Denies shortness of breath, headaches, chest pain or lower extremity edema, sudden onset, vision changes, unilateral weakness, dizziness, paresthesias . He is also , in for pre op surgery- Total Right knee arthroplasty. Anxiety and depression increase with up coming surgery , never been hospitalized or surgery intervention.    Patient reports adherence with medications.  Dietary habits include: sodium  Exercise habits include:yes Family / Social history: No   Past Medical History:  Diagnosis Date   Arthritis    Hypertension Dx 2008   on BP medicine for a brief period    Migraine Dx 1985   No past surgical history on file. No Known Allergies Current Outpatient Medications on File Prior to Visit  Medication Sig Dispense Refill   amLODipine (NORVASC) 10 MG tablet Take 1 tablet (10 mg total) by mouth daily. 90 tablet 0   atorvastatin (LIPITOR) 20 MG tablet Take 1 tablet (20 mg total) by mouth daily. 90 tablet 3   hydrochlorothiazide (HYDRODIURIL) 25 MG tablet Take 1 tablet (25 mg total) by mouth daily. 90 tablet 1   naproxen (NAPROSYN) 500 MG tablet TAKE 1 TABLET BY MOUTH TWICE DAILY WITH A MEAL 60 tablet 0   No current facility-administered medications on file prior to visit.   Social History   Socioeconomic History   Marital status: Single    Spouse name: Not on file   Number of children: 0   Years of education: 12    Highest education level: Not on file  Occupational History   Occupation: Waiter   Tobacco Use   Smoking status: Former    Packs/day: 0.50    Years: 27.00    Pack years: 13.50    Types: Cigarettes    Quit date: 07/23/2015    Years since quitting: 5.3   Smokeless tobacco: Never  Vaping Use   Vaping Use: Never used  Substance and Sexual Activity   Alcohol use: No    Alcohol/week: 0.0 standard drinks    Comment: social   Drug use: Yes     Types: Marijuana    Comment:  Last use 2014   Sexual activity: Never    Birth control/protection: Condom  Other Topics Concern   Not on file  Social History Narrative   Lives alone.   Sister lives in town.   Father lives in [place].       Social Determinants of Health   Financial Resource Strain: Not on file  Food Insecurity: Not on file  Transportation Needs: Not on file  Physical Activity: Not on file  Stress: Not on file  Social Connections: Not on file  Intimate Partner Violence: Not on file   Family History  Problem Relation Age of Onset   Glaucoma Mother    Diabetes Neg Hx    Heart disease Neg Hx    Cancer Neg Hx      OBJECTIVE:  There were no vitals filed for this visit.  Physical Exam General: No apparent distress. Obese male  Eyes: Extraocular eye movements intact, pupils equal and round. Neck: Supple, trachea midline. Thyroid: No enlargement, mobile without fixation, no tenderness. Cardiovascular: Regular rhythm and rate, no murmur, normal radial pulses. Respiratory: Normal respiratory effort, clear to auscultation. Gastrointestinal: Normal pitch active bowel sounds, nontender abdomen without distention or appreciable hepatomegaly. Musculoskeletal: Normal muscle tone, no tenderness on palpation of tibia, no excessive thoracic kyphosis. Skin: Appropriate warmth,  no visible rash. Mental status: Alert, conversant, speech clear, thought logical, appropriate mood and affect, no hallucinations or delusions evident. Hematologic/lymphatic: No cervical adenopathy, no visible ecchymoses.   Review of Systems  Musculoskeletal:        Right knee pain   All other systems reviewed and are negative.  Last 3 Office BP readings: BP Readings from Last 3 Encounters:  08/19/20 (!) 141/69  06/07/20 (!) 143/89  05/07/20 (!) 156/78    BMET    Component Value Date/Time   NA 140 08/19/2020 1043   K 4.6 08/19/2020 1043   CL 103 08/19/2020 1043   CO2 23 08/19/2020 1043    GLUCOSE 180 (H) 08/19/2020 1043   GLUCOSE 143 (H) 12/14/2019 1548   BUN 13 08/19/2020 1043   CREATININE 1.04 08/19/2020 1043   CREATININE 0.79 01/01/2014 1027   CALCIUM 9.6 08/19/2020 1043   GFRNONAA >60 12/14/2019 1548   GFRNONAA >89 01/01/2014 1027   GFRAA 109 10/10/2019 1442   GFRAA >89 01/01/2014 1027    Renal function: CrCl cannot be calculated (Patient's most recent lab result is older than the maximum 21 days allowed.).  Clinical ASCVD: Yes  The 10-year ASCVD risk score (Arnett DK, et al., 2019) is: 14.5%   Values used to calculate the score:     Age: 23 years     Sex: Male     Is Non-Hispanic African American: Yes     Diabetic: No     Tobacco smoker: No     Systolic Blood Pressure: 141 mmHg     Is BP treated: Yes     HDL Cholesterol: 42 mg/dL     Total Cholesterol: 138 mg/dL  ASCVD risk factors include- [place]   ASSESSMENT & PLAN:  Anubis was seen today for blood pressure check.  Diagnoses and all orders for this visit:  Pre-op examination Completed labs order CBC, EKG, CMP, INR   Adjustment disorder with mixed anxiety and depressed mood Flowsheet Row Office Visit from 11/18/2020 [facility]  PHQ-9 Total Score 17      Total Right knee arthroplasty. Anxiety and depression increase with up coming surgery , never been hospitalized or surgery intervention.  Refer to CSW after surgery and re-evaluation of anxiety and depression. Situational.  Arthralgia of right lower leg -Counseled on lifestyle modifications for blood pressure control including reduced dietary sodium, increased exercise, weight reduction and adequate sleep. Also, educated patient about the risk for cardiovascular events, stroke and heart attack. Also counseled patient about the importance of medication adherence. If you participate in smoking, it is important to stop using tobacco as this will increase the risks associated with uncontrolled blood pressure.   Essential  hypertension -Hypertension longstanding diagnosed currently amlodipine 10 mg, and HCTZ 25 mg daily  on current medications. Patient is adherent with current medications.   Goal BP:  For patients younger than 60: Goal BP < 130/80. For patients 60 and older: Goal BP < 140/90. For patients with diabetes: Goal BP < 130/80. Your most recent BP: 132/78  Minimize salt intake. Minimize alcohol intake    This note has been created with Education officer, environmental. Any transcriptional errors are unintentional.   Grayce Sessions, NP 11/18/2020, 8:35 AM

## 2020-11-19 LAB — LIPID PANEL
Chol/HDL Ratio: 3.7 ratio (ref 0.0–5.0)
Cholesterol, Total: 148 mg/dL (ref 100–199)
HDL: 40 mg/dL (ref >39)
LDL Chol Calc (NIH): 86 mg/dL (ref 0–99)
Triglycerides: 122 mg/dL (ref 0–149)
VLDL Cholesterol Cal: 22 mg/dL (ref 5–40)

## 2020-11-19 LAB — CBC WITH DIFFERENTIAL/PLATELET
Basophils Absolute: 0 10*3/uL (ref 0.0–0.2)
Basos: 1 %
EOS (ABSOLUTE): 0.3 10*3/uL (ref 0.0–0.4)
Eos: 5 %
Hematocrit: 37.7 % (ref 37.5–51.0)
Hemoglobin: 13.1 g/dL (ref 13.0–17.7)
Immature Grans (Abs): 0 10*3/uL (ref 0.0–0.1)
Immature Granulocytes: 0 %
Lymphocytes Absolute: 1.8 10*3/uL (ref 0.7–3.1)
Lymphs: 27 %
MCH: 32.6 pg (ref 26.6–33.0)
MCHC: 34.7 g/dL (ref 31.5–35.7)
MCV: 94 fL (ref 79–97)
Monocytes Absolute: 0.7 10*3/uL (ref 0.1–0.9)
Monocytes: 11 %
Neutrophils Absolute: 3.8 10*3/uL (ref 1.4–7.0)
Neutrophils: 56 %
Platelets: 268 10*3/uL (ref 150–450)
RBC: 4.02 x10E6/uL — ABNORMAL LOW (ref 4.14–5.80)
RDW: 12.4 % (ref 11.6–15.4)
WBC: 6.6 10*3/uL (ref 3.4–10.8)

## 2020-11-19 LAB — CMP14+EGFR
ALT: 27 IU/L (ref 0–44)
AST: 20 IU/L (ref 0–40)
Albumin/Globulin Ratio: 1.4 (ref 1.2–2.2)
Albumin: 4.3 g/dL (ref 3.8–4.9)
Alkaline Phosphatase: 104 IU/L (ref 44–121)
BUN/Creatinine Ratio: 16 (ref 9–20)
BUN: 14 mg/dL (ref 6–24)
Bilirubin Total: 0.4 mg/dL (ref 0.0–1.2)
CO2: 24 mmol/L (ref 20–29)
Calcium: 9 mg/dL (ref 8.7–10.2)
Chloride: 101 mmol/L (ref 96–106)
Creatinine, Ser: 0.9 mg/dL (ref 0.76–1.27)
Globulin, Total: 3 g/dL (ref 1.5–4.5)
Glucose: 196 mg/dL — ABNORMAL HIGH (ref 70–99)
Potassium: 4.3 mmol/L (ref 3.5–5.2)
Sodium: 137 mmol/L (ref 134–144)
Total Protein: 7.3 g/dL (ref 6.0–8.5)
eGFR: 98 mL/min/{1.73_m2} (ref >59)

## 2020-11-19 LAB — PROTIME-INR
INR: 1 (ref 0.9–1.2)
Prothrombin Time: 10.2 s (ref 9.1–12.0)

## 2020-11-20 NOTE — Progress Notes (Signed)
DUE TO COVID-19 ONLY ONE VISITOR IS ALLOWED TO COME WITH YOU AND STAY IN THE WAITING ROOM ONLY DURING PRE OP AND PROCEDURE DAY OF SURGERY.  2 VISITOR  MAY VISIT WITH YOU AFTER SURGERY IN YOUR PRIVATE ROOM DURING VISITING HOURS ONLY!  YOU NEED TO HAVE A COVID 19 TEST ON__10/20/2022 @_  @_from  8am-3pm _____, THIS TEST MUST BE DONE BEFORE SURGERY,  Covid test is done at [address] [place], [address].  This is a drive thru.  No appt required. Please see map.                 Your procedure is scheduled on:  12/02/20   Report to [facility] Main  Entrance   Report to admitting at    1000AM     Call this number if you have problems the morning of surgery [phone]    REMEMBER: NO  SOLID FOOD CANDY OR GUM AFTER MIDNIGHT. CLEAR LIQUIDS UNTIL    0940am       . NOTHING BY MOUTH EXCEPT CLEAR LIQUIDS UNTIL     0940am  . PLEASE FINISH ENSURE DRINK PER SURGEON ORDER  WHICH NEEDS TO BE COMPLETED AT   0940am    .      CLEAR LIQUID DIET   Foods Allowed                                                                    Coffee and tea, regular and decaf                            Fruit ices (not with fruit pulp)                                      Iced Popsicles                                    Carbonated beverages, regular and diet                                    Cranberry, grape and apple juices Sports drinks like Gatorade Lightly seasoned clear broth or consume(fat free) Sugar, honey syrup ___________________________________________________________________      BRUSH YOUR TEETH MORNING OF SURGERY AND RINSE YOUR MOUTH OUT, NO CHEWING GUM CANDY OR MINTS.     Take these medicines the morning of surgery with A SIP OF WATER:  none   DO NOT TAKE ANY DIABETIC MEDICATIONS DAY OF YOUR SURGERY                               You may not have any metal on your body including hair pins and              piercings  Do not wear jewelry, make-up, lotions, powders or  perfumes, deodorant             Do  not wear nail polish on your fingernails.  Do not shave  48 hours prior to surgery.              Men may shave face and neck.   Do not bring valuables to the hospital. [place].  Contacts, dentures or bridgework may not be worn into surgery.  Leave suitcase in the car. After surgery it may be brought to your room.     Patients discharged the day of surgery will not be allowed to drive home. IF YOU ARE HAVING SURGERY AND GOING HOME THE SAME DAY, YOU MUST HAVE AN ADULT TO DRIVE YOU HOME AND BE WITH YOU FOR 24 HOURS. YOU MAY GO HOME BY TAXI OR UBER OR ORTHERWISE, BUT AN ADULT MUST ACCOMPANY YOU HOME AND STAY WITH YOU FOR 24 HOURS.  Name and phone number of your driver:  Special Instructions: N/A              Please read over the following fact sheets you were given: _____________________________________________________________________  [facility] - Preparing for Surgery Before surgery, you can play an important role.  Because skin is not sterile, your skin needs to be as free of germs as possible.  You can reduce the number of germs on your skin by washing with CHG (chlorahexidine gluconate) soap before surgery.  CHG is an antiseptic cleaner which kills germs and bonds with the skin to continue killing germs even after washing. Please DO NOT use if you have an allergy to CHG or antibacterial soaps.  If your skin becomes reddened/irritated stop using the CHG and inform your nurse when you arrive at Short Stay. Do not shave (including legs and underarms) for at least 48 hours prior to the first CHG shower.  You may shave your face/neck. Please follow these instructions carefully:  1.  Shower with CHG Soap the night before surgery and the  morning of Surgery.  2.  If you choose to wash your hair, wash your hair first as usual with your  normal  shampoo.  3.  After you shampoo, rinse your hair and body thoroughly  to remove the  shampoo.                           4.  Use CHG as you would any other liquid soap.  You can apply chg directly  to the skin and wash                       Gently with a scrungie or clean washcloth.  5.  Apply the CHG Soap to your body ONLY FROM THE NECK DOWN.   Do not use on face/ open                           Wound or open sores. Avoid contact with eyes, ears mouth and genitals (private parts).                       Wash face,  Genitals (private parts) with your normal soap.             6.  Wash thoroughly, paying special attention to the area where your surgery  will be performed.  7.  Thoroughly rinse your body with warm  water from the neck down.  8.  DO NOT shower/wash with your normal soap after using and rinsing off  the CHG Soap.                9.  Pat yourself dry with a clean towel.            10.  Wear clean pajamas.            11.  Place clean sheets on your bed the night of your first shower and do not  sleep with pets. Day of Surgery : Do not apply any lotions/deodorants the morning of surgery.  Please wear clean clothes to the hospital/surgery center.  FAILURE TO FOLLOW THESE INSTRUCTIONS MAY RESULT IN THE CANCELLATION OF YOUR SURGERY PATIENT SIGNATURE_________________________________  NURSE SIGNATURE__________________________________  ________________________________________________________________________

## 2020-11-20 NOTE — Progress Notes (Addendum)
Anesthesia Review:  CYE:LYHTMBPJ Edwards, NP LOV 11/18/20 preop eval  Cardiologist : Chest x-ray : EKG : 12/15/2019  Echo : Stress test: Cardiac Cath :  Activity level: can do a flgiht of stairs without difficutly  Sleep Study/ CPAP : none  Fasting Blood Sugar :      / Checks Blood Sugar -- times a day:   Blood Thinner/ Instructions /Last Dose: ASA / Instructions/ Last Dose :   Covid test 10/.20/22

## 2020-11-22 ENCOUNTER — Encounter (HOSPITAL_COMMUNITY): Payer: Self-pay

## 2020-11-22 ENCOUNTER — Encounter (HOSPITAL_COMMUNITY)
Admission: RE | Admit: 2020-11-22 | Discharge: 2020-11-22 | Disposition: A | Discharge status: Home / Self Care | Payer: 59 | Source: Ambulatory Visit | Attending: Orthopedic Surgery | Admitting: Orthopedic Surgery

## 2020-11-22 ENCOUNTER — Other Ambulatory Visit: Payer: Self-pay

## 2020-11-22 DIAGNOSIS — Z7901 Long term (current) use of anticoagulants: Secondary | ICD-10-CM | POA: Diagnosis not present

## 2020-11-22 DIAGNOSIS — I10 Essential (primary) hypertension: Secondary | ICD-10-CM | POA: Diagnosis not present

## 2020-11-22 DIAGNOSIS — M1711 Unilateral primary osteoarthritis, right knee: Secondary | ICD-10-CM | POA: Insufficient documentation

## 2020-11-22 DIAGNOSIS — Z01812 Encounter for preprocedural laboratory examination: Secondary | ICD-10-CM | POA: Diagnosis present

## 2020-11-22 DIAGNOSIS — Z87891 Personal history of nicotine dependence: Secondary | ICD-10-CM | POA: Diagnosis not present

## 2020-11-22 DIAGNOSIS — Z79899 Other long term (current) drug therapy: Secondary | ICD-10-CM | POA: Diagnosis not present

## 2020-11-22 HISTORY — DX: Depression, unspecified: F32.A

## 2020-11-22 HISTORY — DX: Anxiety disorder, unspecified: F41.9

## 2020-11-22 LAB — CBC
HCT: 35.8 % — ABNORMAL LOW (ref 39.0–52.0)
Hemoglobin: 12.7 g/dL — ABNORMAL LOW (ref 13.0–17.0)
MCH: 32.9 pg (ref 26.0–34.0)
MCHC: 35.5 g/dL (ref 30.0–36.0)
MCV: 92.7 fL (ref 80.0–100.0)
Platelets: 264 10*3/uL (ref 150–400)
RBC: 3.86 MIL/uL — ABNORMAL LOW (ref 4.22–5.81)
RDW: 12.4 % (ref 11.5–15.5)
WBC: 7.3 10*3/uL (ref 4.0–10.5)
nRBC: 0 % (ref 0.0–0.2)

## 2020-11-22 LAB — COMPREHENSIVE METABOLIC PANEL
ALT: 29 U/L (ref 0–44)
AST: 19 U/L (ref 15–41)
Albumin: 3.8 g/dL (ref 3.5–5.0)
Alkaline Phosphatase: 79 U/L (ref 38–126)
Anion gap: 7 (ref 5–15)
BUN: 13 mg/dL (ref 6–20)
CO2: 26 mmol/L (ref 22–32)
Calcium: 9 mg/dL (ref 8.9–10.3)
Chloride: 102 mmol/L (ref 98–111)
Creatinine, Ser: 0.75 mg/dL (ref 0.61–1.24)
GFR, Estimated: >60 mL/min (ref >60)
Glucose, Bld: 153 mg/dL — ABNORMAL HIGH (ref 70–99)
Potassium: 3.5 mmol/L (ref 3.5–5.1)
Sodium: 135 mmol/L (ref 135–145)
Total Bilirubin: 0.9 mg/dL (ref 0.3–1.2)
Total Protein: 7.6 g/dL (ref 6.5–8.1)

## 2020-11-22 LAB — SURGICAL PCR SCREEN
MRSA, PCR: NEGATIVE
Staphylococcus aureus: POSITIVE — AB

## 2020-11-22 LAB — PROTIME-INR
INR: 1 (ref 0.8–1.2)
Prothrombin Time: 13.6 seconds (ref 11.4–15.2)

## 2020-11-22 NOTE — Progress Notes (Signed)
Anesthesia Chart Review   Case: [id number] Date/Time: 12/02/20 1225   Procedure: TOTAL KNEE ARTHROPLASTY (Right: Knee)   Anesthesia type: Choice   Pre-op diagnosis: right knee osteoarthritis   Location: [facility] ROOM 10 / [facility]S   Surgeons: Ollen Gross, MD       DISCUSSION:60 y.o. former smoker with h/o HTN, right knee OA scheduled for above procedure 12/02/2020 with Dr. Ollen Gross.   Pt seen by PCP 11/18/2020 for preoperative evaluation. Stable at this visit.   Anticipate pt can proceed with planned procedure barring acute status change.   VS: BP (!) 159/73   Pulse 80   Temp 36.7 C (Oral)   Resp 18   Ht 5' 10.5" (1.791 m)   Wt 104.5 kg   SpO2 97%   BMI 32.59 kg/m   PROVIDERS: Grayce Sessions, NP is PCP    LABS: Labs reviewed: Acceptable for surgery. (all labs ordered are listed, but only abnormal results are displayed)  Labs Reviewed  CBC - Abnormal; Notable for the following components:      Result Value   RBC 3.86 (*)    Hemoglobin 12.7 (*)    HCT 35.8 (*)    All other components within normal limits  COMPREHENSIVE METABOLIC PANEL - Abnormal; Notable for the following components:   Glucose, Bld 153 (*)    All other components within normal limits  SURGICAL PCR SCREEN  PROTIME-INR     IMAGES:   EKG: 12/15/2019 Rate 71 bpm  NSR  CV:  Past Medical History:  Diagnosis Date   Anxiety    Arthritis    Depression    Hypertension Dx 2008   on BP medicine for a brief period    Migraine Dx 1985    Past Surgical History:  Procedure Laterality Date   NO PAST SURGERIES      MEDICATIONS:  amLODipine (NORVASC) 10 MG tablet   atorvastatin (LIPITOR) 20 MG tablet   hydrochlorothiazide (HYDRODIURIL) 25 MG tablet   naproxen (NAPROSYN) 500 MG tablet   naproxen sodium (ALEVE) 220 MG tablet   PREDNISONE PO   No current facility-administered medications for this encounter.    Jodell Cipro Ward, PA-C [facility] Pre-Surgical Testing [phone]

## 2020-11-28 ENCOUNTER — Other Ambulatory Visit: Payer: Self-pay | Admitting: Orthopedic Surgery

## 2020-11-29 LAB — SARS CORONAVIRUS 2 (TAT 6-24 HRS): SARS Coronavirus 2: NEGATIVE

## 2020-12-01 NOTE — H&P (Signed)
James KNEE ADMISSION H&P  Patient is being admitted for right James knee arthroplasty.  Subjective:  Chief Complaint: Right knee pain.  HPI: James Robinson, 60 y.o. male has a history of pain and functional disability in the right knee due to arthritis and has failed non-surgical conservative treatments for greater than 12 weeks to include NSAID's and/or analgesics, flexibility and strengthening excercises, and activity modification. Onset of symptoms was gradual, starting  several  years ago with gradually worsening course since that time. The patient noted no past surgery on the right knee.  Patient currently rates pain in the right knee at 7 out of 10 with activity. Patient has worsening of pain with activity and weight bearing, pain that interferes with activities of daily living, pain with passive range of motion, and crepitus. Patient has evidence of periarticular osteophytes and joint space narrowing by imaging studies. There is no active infection.  Patient Active Problem List   Diagnosis Date Noted   Enlarged prostate on rectal examination 01/01/2014   Current smoker 01/01/2014   Rectal bleeding 01/01/2014   SHOULDER PAIN, BILATERAL 04/13/2008   KNEE PAIN, RIGHT 04/13/2008   HEMORRHOIDS, INTERNAL 08/04/2007   Migraine headache 01/19/2007   H/O: HTN (hypertension) 01/19/2007    Past Medical History:  Diagnosis Date   Anxiety    Arthritis    Depression    Hypertension Dx 2008   on BP medicine for a brief period    Migraine Dx 1985    Past Surgical History:  Procedure Laterality Date   NO PAST SURGERIES      Prior to Admission medications   Medication Sig Start Date End Date Taking? Authorizing Provider  naproxen (NAPROSYN) 500 MG tablet TAKE 1 TABLET BY MOUTH TWICE DAILY WITH A MEAL Patient taking differently: Take 500 mg by mouth daily as needed for mild pain or moderate pain. 10/28/20  Yes Grayce Sessions, NP  naproxen sodium (ALEVE) 220 MG tablet Take 440 mg by  mouth daily as needed (pain).   Yes [provider]  PREDNISONE PO Take 1 tablet by mouth daily as needed (lower cholesterol).   Yes [provider]  amLODipine (NORVASC) 10 MG tablet Take 1 tablet (10 mg James) by mouth daily. 04/02/20   Grayce Sessions, NP  atorvastatin (LIPITOR) 20 MG tablet Take 1 tablet (20 mg James) by mouth daily. Patient not taking: Reported on 11/18/2020 04/07/20   Grayce Sessions, NP  hydrochlorothiazide (HYDRODIURIL) 25 MG tablet Take 1 tablet (25 mg James) by mouth daily. Patient not taking: Reported on 11/18/2020 04/02/20   Grayce Sessions, NP    No Known Allergies  Social History   Socioeconomic History   Marital status: Single    Spouse name: Not on file   Number of children: 0   Years of education: 55    Highest education level: Not on file  Occupational History   Occupation: Waiter   Tobacco Use   Smoking status: Former    Packs/day: 0.50    Years: 27.00    Pack years: 13.50    Types: Cigarettes    Quit date: 07/23/2015    Years since quitting: 5.3   Smokeless tobacco: Never  Vaping Use   Vaping Use: Never used  Substance and Sexual Activity   Alcohol use: Yes    Comment: social   Drug use: Yes    Types: Marijuana    Comment: last used 3 weeks ago   Sexual activity: Never  Birth control/protection: Condom  Other Topics Concern   Not on file  Social History Narrative   Lives alone.   Sister lives in town.   Father lives in [place].       Social Determinants of Health   Financial Resource Strain: Not on file  Food Insecurity: Not on file  Transportation Needs: Not on file  Physical Activity: Not on file  Stress: Not on file  Social Connections: Not on file  Intimate Partner Violence: Not on file    Tobacco Use: Medium Risk   Smoking Tobacco Use: Former   Smokeless Tobacco Use: Never   Passive Exposure: Not on file   Social History   Substance and Sexual Activity  Alcohol Use Yes   Comment:  social    Family History  Problem Relation Age of Onset   Glaucoma Mother    Diabetes Neg Hx    Heart disease Neg Hx    Cancer Neg Hx     ROS: Constitutional: no fever, no chills, no night sweats, no significant weight loss Cardiovascular: no chest pain, no palpitations Respiratory: no cough, no shortness of breath, No COPD Gastrointestinal: no vomiting, no nausea Musculoskeletal: no swelling in Joints, Joint Pain Neurologic: no numbness, no tingling, no difficulty with balance   Objective:  Physical Exam: Well nourished and well developed.  General: Alert and oriented x3, cooperative and pleasant, no acute distress.  Head: normocephalic, atraumatic, neck supple.  Eyes: EOMI.  Respiratory: breath sounds clear in all fields, no wheezing, rales, or rhonchi. Cardiovascular: Regular rate and rhythm, no murmurs, gallops or rubs.  Abdomen: non-tender to palpation and soft, normoactive bowel sounds. Musculoskeletal:  The patient has a severely antalgic gait pattern.     Bilateral Hip Exam:   The range of motion: normal without discomfort.     Right Knee Exam:   Severe varus deformity.   No effusion present. No swelling present.   The range of motion is: 5 to 130 degrees.   No crepitus on range of motion of the knee.   Positive medial greater than lateral joint line tenderness.   He has pseudo laxity when I stress in a valgus direction.   The knee is stable.     Left Knee Exam:   No effusion present. No swelling present.   The range of motion: 0 to 125 degrees.   No crepitus on range of motion of the knee.   No medial joint line tenderness.   No lateral joint line tenderness.   The knee is stable.  Calves soft and nontender. Motor function intact in LE. Strength 5/5 LE bilaterally. Neuro: Distal pulses 2+. Sensation to light touch intact in LE.    Vital signs in last 24 hours:    Imaging Review  Radiographs- bilateral AP and lateral of the left knee from 1 week  ago demonstrate severe bone-on-bone arthritis in the medial compartment of the left knee with an approximately 20 degree varus deformity. He also has bone-on-bone patellofemoral arthritis.   Assessment/Plan:  End stage arthritis, right knee   The patient history, physical examination, clinical judgment of the provider and imaging studies are consistent with end stage degenerative joint disease of the right knee and James knee arthroplasty is deemed medically necessary. The treatment options including medical management, injection therapy arthroscopy and arthroplasty were discussed at length. The risks and benefits of James knee arthroplasty were presented and reviewed. The risks due to aseptic loosening, infection, stiffness, patella tracking problems, thromboembolic complications and  other imponderables were discussed. The patient acknowledged the explanation, agreed to proceed with the plan and consent was signed. Patient is being admitted for inpatient treatment for surgery, pain control, PT, OT, prophylactic antibiotics, VTE prophylaxis, progressive ambulation and ADLs and discharge planning. The patient is planning to be discharged  home .   Patient's anticipated LOS is less than 2 midnights, meeting these requirements: - Younger than 22 - Lives within 1 hour of care - Has a competent adult at home to recover with post-op - NO history of  - Chronic pain requiring opioids  - Diabetes  - Coronary Artery Disease  - Heart failure  - Heart attack  - Stroke  - DVT/VTE  - Cardiac arrhythmia  - Respiratory Failure/COPD  - Renal failure  - Anemia  - Advanced Liver disease  Therapy Plans: [facility] Disposition: Home with Friend or Sister Planned DVT Prophylaxis: Aspirin 325mg  DME Needed: None PCP: , NP TXA: IV Allergies: NKDA Anesthesia Concerns: None BMI: 33.8 Last HgbA1c: N/A  Pharmacy: [address]    - Patient was instructed on what medications  to stop prior to surgery. - Follow-up visit in 2 weeks with Dr. Etheleen Sia - Begin physical therapy following surgery - Pre-operative lab work as pre-surgical testing - Prescriptions will be provided in hospital at time of discharge  Lequita Halt, [facility], PA-C Orthopedic Surgery [facility] [place]

## 2020-12-02 ENCOUNTER — Encounter (HOSPITAL_COMMUNITY): Payer: Self-pay | Admitting: Orthopedic Surgery

## 2020-12-02 ENCOUNTER — Ambulatory Visit (HOSPITAL_COMMUNITY): Payer: 59 | Admitting: Anesthesiology

## 2020-12-02 ENCOUNTER — Encounter (HOSPITAL_COMMUNITY)
Admission: RE | Disposition: A | Discharge status: Home / Self Care | Payer: Self-pay | Source: Home / Self Care | Attending: Orthopedic Surgery

## 2020-12-02 ENCOUNTER — Observation Stay (HOSPITAL_COMMUNITY)
Admission: RE | Admit: 2020-12-02 | Discharge: 2020-12-03 | Disposition: A | Discharge status: Home / Self Care | Payer: 59 | Attending: Orthopedic Surgery | Admitting: Orthopedic Surgery

## 2020-12-02 ENCOUNTER — Other Ambulatory Visit: Payer: Self-pay

## 2020-12-02 ENCOUNTER — Ambulatory Visit (HOSPITAL_COMMUNITY): Payer: 59 | Admitting: Physician Assistant

## 2020-12-02 DIAGNOSIS — I10 Essential (primary) hypertension: Secondary | ICD-10-CM | POA: Diagnosis not present

## 2020-12-02 DIAGNOSIS — Z79899 Other long term (current) drug therapy: Secondary | ICD-10-CM | POA: Diagnosis not present

## 2020-12-02 DIAGNOSIS — F17210 Nicotine dependence, cigarettes, uncomplicated: Secondary | ICD-10-CM | POA: Diagnosis not present

## 2020-12-02 DIAGNOSIS — M1711 Unilateral primary osteoarthritis, right knee: Principal | ICD-10-CM | POA: Diagnosis present

## 2020-12-02 DIAGNOSIS — M179 Osteoarthritis of knee, unspecified: Secondary | ICD-10-CM | POA: Diagnosis present

## 2020-12-02 HISTORY — PX: TOTAL KNEE ARTHROPLASTY: SHX125

## 2020-12-02 SURGERY — ARTHROPLASTY, KNEE, TOTAL
Anesthesia: Monitor Anesthesia Care | Site: Knee | Laterality: Right

## 2020-12-02 MED ORDER — DEXAMETHASONE SODIUM PHOSPHATE 10 MG/ML IJ SOLN
8.0000 mg | Freq: Once | INTRAMUSCULAR | Status: AC
  Administered 2020-12-02: 8 mg via INTRAVENOUS

## 2020-12-02 MED ORDER — LACTATED RINGERS IV SOLN: INTRAVENOUS | Status: DC

## 2020-12-02 MED ORDER — PROPOFOL 1000 MG/100ML IV EMUL
INTRAVENOUS | Status: AC
  Filled 2020-12-02: qty 100

## 2020-12-02 MED ORDER — BISACODYL 10 MG RE SUPP: 10.0000 mg | Freq: Every day | RECTAL | Status: DC | PRN

## 2020-12-02 MED ORDER — FENTANYL CITRATE (PF) 100 MCG/2ML IJ SOLN
INTRAMUSCULAR | Status: AC
  Filled 2020-12-02: qty 2

## 2020-12-02 MED ORDER — FLEET ENEMA 7-19 GM/118ML RE ENEM: 1.0000 | ENEMA | Freq: Once | RECTAL | Status: DC | PRN

## 2020-12-02 MED ORDER — FENTANYL CITRATE PF 50 MCG/ML IJ SOSY
50.0000 ug | PREFILLED_SYRINGE | INTRAMUSCULAR | Status: DC
  Administered 2020-12-02: 100 ug via INTRAVENOUS
  Filled 2020-12-02: qty 2

## 2020-12-02 MED ORDER — BUPIVACAINE LIPOSOME 1.3 % IJ SUSP: 20.0000 mL | Freq: Once | INTRAMUSCULAR | Status: DC

## 2020-12-02 MED ORDER — BUPIVACAINE IN DEXTROSE 0.75-8.25 % IT SOLN
INTRATHECAL | Status: DC | PRN
  Administered 2020-12-02: 1.8 mL via INTRATHECAL

## 2020-12-02 MED ORDER — ACETAMINOPHEN 10 MG/ML IV SOLN
1000.0000 mg | Freq: Four times a day (QID) | INTRAVENOUS | Status: DC
  Administered 2020-12-02: 1000 mg via INTRAVENOUS
  Filled 2020-12-02: qty 100

## 2020-12-02 MED ORDER — CEFAZOLIN SODIUM-DEXTROSE 2-4 GM/100ML-% IV SOLN
2.0000 g | Freq: Four times a day (QID) | INTRAVENOUS | Status: AC
Start: 2020-12-02 — End: 2020-12-03
  Administered 2020-12-02 – 2020-12-03 (×2): 2 g via INTRAVENOUS
  Filled 2020-12-02 (×2): qty 100

## 2020-12-02 MED ORDER — CEFAZOLIN SODIUM-DEXTROSE 2-4 GM/100ML-% IV SOLN
2.0000 g | INTRAVENOUS | Status: AC
  Administered 2020-12-02: 2 g via INTRAVENOUS
  Filled 2020-12-02: qty 100

## 2020-12-02 MED ORDER — MORPHINE SULFATE (PF) 2 MG/ML IV SOLN
0.5000 mg | INTRAVENOUS | Status: DC | PRN
  Administered 2020-12-03: 1 mg via INTRAVENOUS
  Filled 2020-12-02: qty 1

## 2020-12-02 MED ORDER — ONDANSETRON HCL 4 MG/2ML IJ SOLN: 4.0000 mg | Freq: Four times a day (QID) | INTRAMUSCULAR | Status: DC | PRN

## 2020-12-02 MED ORDER — PROPOFOL 500 MG/50ML IV EMUL
INTRAVENOUS | Status: AC
  Filled 2020-12-02: qty 50

## 2020-12-02 MED ORDER — MIDAZOLAM HCL 2 MG/2ML IJ SOLN
1.0000 mg | INTRAMUSCULAR | Status: DC
  Filled 2020-12-02: qty 2

## 2020-12-02 MED ORDER — FENTANYL CITRATE (PF) 100 MCG/2ML IJ SOLN
INTRAMUSCULAR | Status: DC | PRN
  Administered 2020-12-02: 50 ug via INTRAVENOUS

## 2020-12-02 MED ORDER — GABAPENTIN 300 MG PO CAPS
300.0000 mg | ORAL_CAPSULE | Freq: Three times a day (TID) | ORAL | Status: DC
  Administered 2020-12-02 (×2): 300 mg via ORAL
  Filled 2020-12-02 (×2): qty 1

## 2020-12-02 MED ORDER — OXYCODONE HCL 5 MG PO TABS: 5.0000 mg | ORAL_TABLET | Freq: Once | ORAL | Status: DC | PRN

## 2020-12-02 MED ORDER — METOCLOPRAMIDE HCL 5 MG/ML IJ SOLN: 5.0000 mg | Freq: Three times a day (TID) | INTRAMUSCULAR | Status: DC | PRN

## 2020-12-02 MED ORDER — PHENOL 1.4 % MT LIQD: 1.0000 | OROMUCOSAL | Status: DC | PRN

## 2020-12-02 MED ORDER — OXYCODONE HCL 5 MG/5ML PO SOLN
5.0000 mg | Freq: Once | ORAL | Status: DC | PRN
Start: 2020-12-02 — End: 2020-12-02

## 2020-12-02 MED ORDER — METOCLOPRAMIDE HCL 5 MG PO TABS: 5.0000 mg | ORAL_TABLET | Freq: Three times a day (TID) | ORAL | Status: DC | PRN

## 2020-12-02 MED ORDER — METHOCARBAMOL 500 MG IVPB - SIMPLE MED
500.0000 mg | Freq: Four times a day (QID) | INTRAVENOUS | Status: DC | PRN
  Filled 2020-12-02: qty 50

## 2020-12-02 MED ORDER — ACETAMINOPHEN 500 MG PO TABS
1000.0000 mg | ORAL_TABLET | Freq: Four times a day (QID) | ORAL | Status: DC
  Administered 2020-12-02 – 2020-12-03 (×3): 1000 mg via ORAL
  Filled 2020-12-02 (×4): qty 2

## 2020-12-02 MED ORDER — POLYETHYLENE GLYCOL 3350 17 G PO PACK: 17.0000 g | PACK | Freq: Every day | ORAL | Status: DC | PRN

## 2020-12-02 MED ORDER — TRAMADOL HCL 50 MG PO TABS: 50.0000 mg | ORAL_TABLET | Freq: Four times a day (QID) | ORAL | Status: DC | PRN

## 2020-12-02 MED ORDER — SODIUM CHLORIDE (PF) 0.9 % IJ SOLN
INTRAMUSCULAR | Status: AC
  Filled 2020-12-02: qty 10

## 2020-12-02 MED ORDER — BUPIVACAINE LIPOSOME 1.3 % IJ SUSP
INTRAMUSCULAR | Status: AC
  Filled 2020-12-02: qty 20

## 2020-12-02 MED ORDER — AMLODIPINE BESYLATE 10 MG PO TABS
10.0000 mg | ORAL_TABLET | Freq: Every day | ORAL | Status: DC
  Administered 2020-12-02 – 2020-12-03 (×2): 10 mg via ORAL
  Filled 2020-12-02 (×2): qty 1

## 2020-12-02 MED ORDER — MIDAZOLAM HCL 2 MG/2ML IJ SOLN
INTRAMUSCULAR | Status: AC
  Administered 2020-12-02: 2 mg via INTRAVENOUS
  Filled 2020-12-02: qty 2

## 2020-12-02 MED ORDER — ONDANSETRON HCL 4 MG/2ML IJ SOLN
INTRAMUSCULAR | Status: DC | PRN
  Administered 2020-12-02: 4 mg via INTRAVENOUS

## 2020-12-02 MED ORDER — ONDANSETRON HCL 4 MG PO TABS: 4.0000 mg | ORAL_TABLET | Freq: Four times a day (QID) | ORAL | Status: DC | PRN

## 2020-12-02 MED ORDER — PROPOFOL 500 MG/50ML IV EMUL
INTRAVENOUS | Status: DC | PRN
  Administered 2020-12-02: 30 mg via INTRAVENOUS

## 2020-12-02 MED ORDER — PHENYLEPHRINE 40 MCG/ML (10ML) SYRINGE FOR IV PUSH (FOR BLOOD PRESSURE SUPPORT)
PREFILLED_SYRINGE | INTRAVENOUS | Status: DC | PRN
  Administered 2020-12-02 (×3): 80 ug via INTRAVENOUS

## 2020-12-02 MED ORDER — ONDANSETRON HCL 4 MG/2ML IJ SOLN: 4.0000 mg | Freq: Once | INTRAMUSCULAR | Status: DC | PRN

## 2020-12-02 MED ORDER — METHOCARBAMOL 500 MG PO TABS
500.0000 mg | ORAL_TABLET | Freq: Four times a day (QID) | ORAL | Status: DC | PRN
  Administered 2020-12-02 – 2020-12-03 (×3): 500 mg via ORAL
  Filled 2020-12-02 (×3): qty 1

## 2020-12-02 MED ORDER — PHENYLEPHRINE 40 MCG/ML (10ML) SYRINGE FOR IV PUSH (FOR BLOOD PRESSURE SUPPORT)
PREFILLED_SYRINGE | INTRAVENOUS | Status: AC
  Filled 2020-12-02: qty 10

## 2020-12-02 MED ORDER — DEXAMETHASONE SODIUM PHOSPHATE 10 MG/ML IJ SOLN
INTRAMUSCULAR | Status: DC | PRN
  Administered 2020-12-02: 10 mg

## 2020-12-02 MED ORDER — BUPIVACAINE LIPOSOME 1.3 % IJ SUSP
INTRAMUSCULAR | Status: DC | PRN
  Administered 2020-12-02: 20 mL

## 2020-12-02 MED ORDER — TRANEXAMIC ACID-NACL 1000-0.7 MG/100ML-% IV SOLN
1000.0000 mg | INTRAVENOUS | Status: AC
  Administered 2020-12-02: 1000 mg via INTRAVENOUS
  Filled 2020-12-02: qty 100

## 2020-12-02 MED ORDER — ROPIVACAINE HCL 5 MG/ML IJ SOLN
INTRAMUSCULAR | Status: DC | PRN
  Administered 2020-12-02: 30 mL via PERINEURAL

## 2020-12-02 MED ORDER — DEXAMETHASONE SODIUM PHOSPHATE 10 MG/ML IJ SOLN
10.0000 mg | Freq: Once | INTRAMUSCULAR | Status: AC
  Administered 2020-12-03: 10 mg via INTRAVENOUS
  Filled 2020-12-02: qty 1

## 2020-12-02 MED ORDER — MENTHOL 3 MG MT LOZG: 1.0000 | LOZENGE | OROMUCOSAL | Status: DC | PRN

## 2020-12-02 MED ORDER — DOCUSATE SODIUM 100 MG PO CAPS
100.0000 mg | ORAL_CAPSULE | Freq: Two times a day (BID) | ORAL | Status: DC
  Administered 2020-12-02 – 2020-12-03 (×2): 100 mg via ORAL
  Filled 2020-12-02 (×2): qty 1

## 2020-12-02 MED ORDER — POVIDONE-IODINE 10 % EX SWAB
2.0000 "application " | Freq: Once | CUTANEOUS | Status: AC
  Administered 2020-12-02: 2 via TOPICAL

## 2020-12-02 MED ORDER — OXYCODONE HCL 5 MG PO TABS
5.0000 mg | ORAL_TABLET | ORAL | Status: DC | PRN
Start: 2020-12-02 — End: 2020-12-03
  Administered 2020-12-02: 5 mg via ORAL
  Administered 2020-12-02 – 2020-12-03 (×3): 10 mg via ORAL
  Filled 2020-12-02 (×2): qty 2
  Filled 2020-12-02: qty 1
  Filled 2020-12-02: qty 2

## 2020-12-02 MED ORDER — DIPHENHYDRAMINE HCL 12.5 MG/5ML PO ELIX: 12.5000 mg | ORAL_SOLUTION | ORAL | Status: DC | PRN

## 2020-12-02 MED ORDER — PROPOFOL 500 MG/50ML IV EMUL
INTRAVENOUS | Status: DC | PRN
  Administered 2020-12-02: 75 ug/kg/min via INTRAVENOUS

## 2020-12-02 MED ORDER — SODIUM CHLORIDE 0.9 % IR SOLN
Status: DC | PRN
  Administered 2020-12-02: 1000 mL

## 2020-12-02 MED ORDER — SODIUM CHLORIDE 0.9 % IV SOLN: INTRAVENOUS | Status: DC

## 2020-12-02 MED ORDER — ASPIRIN EC 325 MG PO TBEC
325.0000 mg | DELAYED_RELEASE_TABLET | Freq: Two times a day (BID) | ORAL | Status: DC
  Administered 2020-12-03: 325 mg via ORAL
  Filled 2020-12-02: qty 1

## 2020-12-02 MED ORDER — SODIUM CHLORIDE (PF) 0.9 % IJ SOLN
INTRAMUSCULAR | Status: DC | PRN
  Administered 2020-12-02: 60 mL

## 2020-12-02 MED ORDER — HYDROMORPHONE HCL 1 MG/ML IJ SOLN: 0.2500 mg | INTRAMUSCULAR | Status: DC | PRN

## 2020-12-02 SURGICAL SUPPLY — 55 items
ATTUNE MED DOME PAT 38 KNEE (Knees) ×1 IMPLANT
ATTUNE PS FEM RT SZ 7 CEM KNEE (Femur) ×1 IMPLANT
BAG COUNTER SPONGE SURGICOUNT (BAG) IMPLANT
BAG SPEC THK2 15X12 ZIP CLS (MISCELLANEOUS) ×1
BAG SPNG CNTER NS LX DISP (BAG)
BAG ZIPLOCK 12X15 (MISCELLANEOUS) ×2 IMPLANT
BASE TIBIAL ROT PLAT SZ 7 KNEE (Knees) IMPLANT
BLADE SAG 18X100X1.27 (BLADE) ×2 IMPLANT
BLADE SAW SGTL 11.0X1.19X90.0M (BLADE) ×2 IMPLANT
BNDG CMPR MED 10X6 ELC LF (GAUZE/BANDAGES/DRESSINGS) ×1
BNDG ELASTIC 6X10 VLCR STRL LF (GAUZE/BANDAGES/DRESSINGS) ×1 IMPLANT
BNDG ELASTIC 6X5.8 VLCR STR LF (GAUZE/BANDAGES/DRESSINGS) ×2 IMPLANT
BOWL SMART MIX CTS (DISPOSABLE) ×2 IMPLANT
BSPLAT TIB 7 CMNT ROT PLAT STR (Knees) ×1 IMPLANT
CEMENT HV SMART SET (Cement) ×4 IMPLANT
COVER SURGICAL LIGHT HANDLE (MISCELLANEOUS) ×2 IMPLANT
CUFF TOURN SGL QUICK 34 (TOURNIQUET CUFF) ×2
CUFF TRNQT CYL 34X4.125X (TOURNIQUET CUFF) ×1 IMPLANT
DECANTER SPIKE VIAL GLASS SM (MISCELLANEOUS) ×2 IMPLANT
DRAPE INCISE IOBAN 66X45 STRL (DRAPES) ×2 IMPLANT
DRAPE U-SHAPE 47X51 STRL (DRAPES) ×2 IMPLANT
DRSG AQUACEL AG ADV 3.5X10 (GAUZE/BANDAGES/DRESSINGS) ×2 IMPLANT
DURAPREP 26ML APPLICATOR (WOUND CARE) ×2 IMPLANT
ELECT REM PT RETURN 15FT ADLT (MISCELLANEOUS) ×2 IMPLANT
GLOVE SRG 8 PF TXTR STRL LF DI (GLOVE) ×1 IMPLANT
GLOVE SURG ENC MOIS LTX SZ6.5 (GLOVE) ×2 IMPLANT
GLOVE SURG ENC MOIS LTX SZ8 (GLOVE) ×4 IMPLANT
GLOVE SURG UNDER POLY LF SZ7 (GLOVE) ×2 IMPLANT
GLOVE SURG UNDER POLY LF SZ8 (GLOVE) ×2
GLOVE SURG UNDER POLY LF SZ8.5 (GLOVE) ×2 IMPLANT
GOWN STRL REUS W/TWL LRG LVL3 (GOWN DISPOSABLE) ×4 IMPLANT
GOWN STRL REUS W/TWL XL LVL3 (GOWN DISPOSABLE) ×2 IMPLANT
HANDPIECE INTERPULSE COAX TIP (DISPOSABLE) ×2
HOLDER FOLEY CATH W/STRAP (MISCELLANEOUS) IMPLANT
IMMOBILIZER KNEE 20 (SOFTGOODS) ×2
IMMOBILIZER KNEE 20 THIGH 36 (SOFTGOODS) ×1 IMPLANT
INSERT ATTU PSRP SZ7 14MM KNEE (Insert) ×1 IMPLANT
MANIFOLD NEPTUNE II (INSTRUMENTS) ×2 IMPLANT
NS IRRIG 1000ML POUR BTL (IV SOLUTION) ×2 IMPLANT
PACK TOTAL KNEE CUSTOM (KITS) ×2 IMPLANT
PADDING CAST COTTON 6X4 STRL (CAST SUPPLIES) ×3 IMPLANT
PROTECTOR NERVE ULNAR (MISCELLANEOUS) ×2 IMPLANT
SET HNDPC FAN SPRY TIP SCT (DISPOSABLE) ×1 IMPLANT
STRIP CLOSURE SKIN 1/2X4 (GAUZE/BANDAGES/DRESSINGS) ×4 IMPLANT
SUT MNCRL AB 4-0 PS2 18 (SUTURE) ×2 IMPLANT
SUT STRATAFIX 0 PDS 27 VIOLET (SUTURE) ×2
SUT VIC AB 2-0 CT1 27 (SUTURE) ×6
SUT VIC AB 2-0 CT1 TAPERPNT 27 (SUTURE) ×3 IMPLANT
SUTURE STRATFX 0 PDS 27 VIOLET (SUTURE) ×1 IMPLANT
TAPE STRIPS DRAPE STRL (GAUZE/BANDAGES/DRESSINGS) ×1 IMPLANT
TIBIAL BASE ROT PLAT SZ 7 KNEE (Knees) ×2 IMPLANT
TRAY FOLEY MTR SLVR 16FR STAT (SET/KITS/TRAYS/PACK) ×2 IMPLANT
TUBE SUCTION HIGH CAP CLEAR NV (SUCTIONS) ×2 IMPLANT
WATER STERILE IRR 1000ML POUR (IV SOLUTION) ×4 IMPLANT
WRAP KNEE MAXI GEL POST OP (GAUZE/BANDAGES/DRESSINGS) ×2 IMPLANT

## 2020-12-02 NOTE — Anesthesia Procedure Notes (Signed)
Spinal  Patient location during procedure: OR Start time: 12/02/2020 1:08 PM End time: 12/02/2020 1:18 PM Reason for block: surgical anesthesia Staffing Performed: anesthesiologist  Anesthesiologist: Lannie Fields, DO Preanesthetic Checklist Completed: patient identified, IV checked, risks and benefits discussed, surgical consent, monitors and equipment checked, pre-op evaluation and timeout performed Spinal Block Patient position: sitting Prep: DuraPrep and site prepped and draped Patient monitoring: cardiac monitor, continuous pulse ox and blood pressure Approach: midline Location: L3-4 Injection technique: single-shot Needle Needle type: Pencan  Needle gauge: 24 G Needle length: 9 cm Assessment Sensory level: T6 Events: CSF return and second provider Additional Notes Functioning IV was confirmed and monitors were applied. Sterile prep and drape, including hand hygiene and sterile gloves were used. The patient was positioned and the spine was prepped. The skin was anesthetized with lidocaine.  Free flow of clear CSF was obtained prior to injecting local anesthetic into the CSF.  The spinal needle aspirated freely following injection.  The needle was carefully withdrawn.  The patient tolerated the procedure well.

## 2020-12-02 NOTE — Progress Notes (Signed)
AssistedDr. Beth Finucane with right, ultrasound guided, adductor canal block. Side rails up, monitors on throughout procedure. See vital signs in flow sheet. Tolerated Procedure well.  

## 2020-12-02 NOTE — Anesthesia Preprocedure Evaluation (Addendum)
Anesthesia Evaluation  Patient identified by MRN, date of birth, ID band Patient awake    Reviewed: Allergy & Precautions, NPO status , Patient's Chart, lab work & pertinent test results  Airway Mallampati: III  TM Distance: >3 FB Neck ROM: Full  Mouth opening: Limited Mouth Opening  Dental no notable dental hx. (+) Missing,    Pulmonary former smoker,  Quit smoking 2017    Pulmonary exam normal breath sounds clear to auscultation       Cardiovascular hypertension (184/83 in preop, per pt normally in 130s SBP), Pt. on medications Normal cardiovascular exam Rhythm:Regular Rate:Normal     Neuro/Psych  Headaches, PSYCHIATRIC DISORDERS Anxiety Depression    GI/Hepatic negative GI ROS, (+)     substance abuse  marijuana use, Only occasional marijuana    Endo/Other  negative endocrine ROS  Renal/GU negative Renal ROS  negative genitourinary   Musculoskeletal  (+) Arthritis , Osteoarthritis,    Abdominal (+) + obese,   Peds negative pediatric ROS (+)  Hematology negative hematology ROS (+) hct 35.8, plt 264   Anesthesia Other Findings   Reproductive/Obstetrics negative OB ROS                            Anesthesia Physical Anesthesia Plan  ASA: 3  Anesthesia Plan: Spinal, Regional and MAC   Post-op Pain Management:    Induction:   PONV Risk Score and Plan: 2 and Propofol infusion and TIVA  Airway Management Planned: Natural Airway and Nasal Cannula  Additional Equipment: None  Intra-op Plan:   Post-operative Plan:   Informed Consent: I have reviewed the patients History and Physical, chart, labs and discussed the procedure including the risks, benefits and alternatives for the proposed anesthesia with the patient or authorized representative who has indicated his/her understanding and acceptance.       Plan Discussed with: CRNA  Anesthesia Plan Comments: (Poorly controlled  HTN Nipple ring unable to remove)       Anesthesia Quick Evaluation

## 2020-12-02 NOTE — Interval H&P Note (Signed)
History and Physical Interval Note:  12/02/2020 11:00 AM  James Robinson  has presented today for surgery, with the diagnosis of right knee osteoarthritis.  The various methods of treatment have been discussed with the patient and family. After consideration of risks, benefits and other options for treatment, the patient has consented to  Procedure(s): TOTAL KNEE ARTHROPLASTY (Right) as a surgical intervention.  The patient's history has been reviewed, patient examined, no change in status, stable for surgery.  I have reviewed the patient's chart and labs.  Questions were answered to the patient's satisfaction.     Homero Fellers Karma Hiney

## 2020-12-02 NOTE — Progress Notes (Signed)
Orthopedic Tech Progress Note Patient Details:  James Robinson 04-05-1960 [id number]  CPM Right Knee CPM Right Knee: On Right Knee Flexion (Degrees): 40 Right Knee Extension (Degrees): 10  Post Interventions Patient Tolerated: Well Instructions Provided: Care of device, Adjustment of device  Saul Fordyce 12/02/2020, 3:03 PM

## 2020-12-02 NOTE — Anesthesia Procedure Notes (Signed)
Anesthesia Regional Block: Adductor canal block   Pre-Anesthetic Checklist: , timeout performed,  Correct Patient, Correct Site, Correct Laterality,  Correct Procedure, Correct Position, site marked,  Risks and benefits discussed,  Surgical consent,  Pre-op evaluation,  At surgeon's request and post-op pain management  Laterality: Right  Prep: Maximum Sterile Barrier Precautions used, chloraprep       Needles:  Injection technique: Single-shot  Needle Type: Echogenic Stimulator Needle     Needle Length: 9cm  Needle Gauge: 22     Additional Needles:   Procedures:,,,, ultrasound used (permanent image in chart),,    Narrative:  Start time: 12/02/2020 12:15 PM End time: 12/02/2020 12:20 PM Injection made incrementally with aspirations every 5 mL.  Performed by: Personally  Anesthesiologist: Lannie Fields, DO  Additional Notes: Monitors applied. No increased pain on injection. No increased resistance to injection. Injection made in 5cc increments. Good needle visualization. Patient tolerated procedure well.

## 2020-12-02 NOTE — Transfer of Care (Signed)
Immediate Anesthesia Transfer of Care Note  Patient: [name]  Procedure(s) Performed: TOTAL KNEE ARTHROPLASTY (Right: Knee)  Patient Location: PACU  Anesthesia Type:Spinal  Level of Consciousness: drowsy, patient cooperative and responds to stimulation  Airway & Oxygen Therapy: Patient Spontanous Breathing and Patient connected to face mask oxygen  Post-op Assessment: Report given to RN and Post -op Vital signs reviewed and stable  Post vital signs: Reviewed and stable  Last Vitals:  Vitals Value Taken Time  BP 111/63 12/02/20 1449  Temp    Pulse 61 12/02/20 1451  Resp 9 12/02/20 1451  SpO2 100 % 12/02/20 1451  Vitals shown include unvalidated device data.  Last Pain:  Vitals:   12/02/20 1032  TempSrc:   PainSc: 4       Patients Stated Pain Goal: 3 (12/02/20 1032)  Complications: No notable events documented.

## 2020-12-02 NOTE — Anesthesia Postprocedure Evaluation (Signed)
Anesthesia Post Note  Patient: James Robinson  Procedure(s) Performed: TOTAL KNEE ARTHROPLASTY (Right: Knee)     Patient location during evaluation: PACU Anesthesia Type: Regional, Spinal and MAC Level of consciousness: awake and alert and oriented Pain management: pain level controlled Vital Signs Assessment: post-procedure vital signs reviewed and stable Respiratory status: spontaneous breathing, nonlabored ventilation and respiratory function stable Cardiovascular status: blood pressure returned to baseline and stable Postop Assessment: no headache, no backache, spinal receding and no apparent nausea or vomiting Anesthetic complications: no   No notable events documented.  Last Vitals:  Vitals:   12/02/20 1515 12/02/20 1530  BP: 131/75 122/69  Pulse: 64 (!) 59  Resp: 14 12  Temp:    SpO2: 99% 99%    Last Pain:  Vitals:   12/02/20 1530  TempSrc:   PainSc: [place]

## 2020-12-02 NOTE — Op Note (Signed)
OPERATIVE REPORT-TOTAL KNEE ARTHROPLASTY   Pre-operative diagnosis- Osteoarthritis  Right knee(s)  Post-operative diagnosis- Osteoarthritis Right knee(s)  Procedure-  Right  Total Knee Arthroplasty  Surgeon- James Rankin. Lakeshia Dohner, MD  Assistant- Arther Abbott, PA-C   Anesthesia-  Adductor canal block and spinal  EBL-50 mL   Drains None  Tourniquet time-  Total Tourniquet Time Documented: Thigh (Right) - 47 minutes Total: Thigh (Right) - 47 minutes     Complications- None  Condition-PACU - hemodynamically stable.   Brief Clinical Note  James Robinson is a 60 y.o. year old male with end stage OA of his right knee with progressively worsening pain and dysfunction. He has constant pain, with activity and at rest and significant functional deficits with difficulties even with ADLs. He has had extensive non-op management including analgesics, injections of cortisone, and home exercise program, but remains in significant pain with significant dysfunction. Radiographs show bone on bone arthritis medial compartment with severe varus deformity . He presents now for right Total Knee Arthroplasty.     Procedure in detail---   The patient is brought into the operating room and positioned supine on the operating table. After successful administration of  adductor canal block and spinal,   a tourniquet is placed high on the  Right thigh(s) and the lower extremity is prepped and draped in the usual sterile fashion. Time out is performed by the operating team and then the  Right lower extremity is wrapped in Esmarch, knee flexed and the tourniquet inflated to 300 mmHg.       A midline incision is made with a ten blade through the subcutaneous tissue to the level of the extensor mechanism. A fresh blade is used to make a medial parapatellar arthrotomy. Soft tissue over the proximal medial tibia is subperiosteally elevated to the joint line with a knife and into the semimembranosus bursa with a Cobb  elevator. Soft tissue over the proximal lateral tibia is elevated with attention being paid to avoiding the patellar tendon on the tibial tubercle. The patella is everted, knee flexed 90 degrees and the ACL and PCL are removed. Findings are bone on bone medial and patellofemoral with large global osteophytes.        The drill is used to create a starting hole in the distal femur and the canal is thoroughly irrigated with sterile saline to remove the fatty contents. The 5 degree Right  valgus alignment guide is placed into the femoral canal and the distal femoral cutting block is pinned to remove 9 mm off the distal femur. Resection is made with an oscillating saw.      The tibia is subluxed forward and the menisci are removed. The extramedullary alignment guide is placed referencing proximally at the medial aspect of the tibial tubercle and distally along the second metatarsal axis and tibial crest. The block is pinned to remove 50mm off the more deficient medial  side. Resection is made with an oscillating saw. Size 7is the most appropriate size for the tibia and the proximal tibia is prepared with the modular drill and keel punch for that size.      The femoral sizing guide is placed and size 7 is most appropriate. Rotation is marked off the epicondylar axis and confirmed by creating a rectangular flexion gap at 90 degrees. The size 7 cutting block is pinned in this rotation and the anterior, posterior and chamfer cuts are made with the oscillating saw. The intercondylar block is then placed and that cut  is made.      Trial size 7 tibial component, trial size 7 posterior stabilized femur and a 14  mm posterior stabilized rotating platform insert trial is placed. Full extension is achieved with excellent varus/valgus and anterior/posterior balance throughout full range of motion. The patella is everted and thickness measured to be 24  mm. Free hand resection is taken to 14 mm, a 38 template is placed, lug holes  are drilled, trial patella is placed, and it tracks normally. Osteophytes are removed off the posterior femur with the trial in place. All trials are removed and the cut bone surfaces prepared with pulsatile lavage. Cement is mixed and once ready for implantation, the size 7 tibial implant, size  7 posterior stabilized femoral component, and the size 38 patella are cemented in place and the patella is held with the clamp. The trial insert is placed and the knee held in full extension. The Exparel (20 ml mixed with 60 ml saline) is injected into the extensor mechanism, posterior capsule, medial and lateral gutters and subcutaneous tissues.  All extruded cement is removed and once the cement is hard the permanent 14 mm posterior stabilized rotating platform insert is placed into the tibial tray.      The wound is copiously irrigated with saline solution and the extensor mechanism closed with # 0 Stratofix suture. The tourniquet is released for a total tourniquet time of 47  minutes. Flexion against gravity is 140 degrees and the patella tracks normally. Subcutaneous tissue is closed with 2.0 vicryl and subcuticular with running 4.0 Monocryl. The incision is cleaned and dried and steri-strips and a bulky sterile dressing are applied. The limb is placed into a knee immobilizer and the patient is awakened and transported to recovery in stable condition.      Please note that a surgical assistant was a medical necessity for this procedure in order to perform it in a safe and expeditious manner. Surgical assistant was necessary to retract the ligaments and vital neurovascular structures to prevent injury to them and also necessary for proper positioning of the limb to allow for anatomic placement of the prosthesis.   James Rankin Josilynn Losh, MD    12/02/2020, 2:29 PM

## 2020-12-02 NOTE — Discharge Instructions (Signed)
 Frank Aluisio, MD Total Joint Specialist [facility] [place] [address] [place], [place] [address] [phone]  TOTAL KNEE REPLACEMENT POSTOPERATIVE DIRECTIONS    Knee Rehabilitation, Guidelines Following Surgery  Results after knee surgery are often greatly improved when you follow the exercise, range of motion and muscle strengthening exercises prescribed by your doctor. Safety measures are also important to protect the knee from further injury. If any of these exercises cause you to have increased pain or swelling in your knee joint, decrease the amount until you are comfortable again and slowly increase them. If you have problems or questions, call your caregiver or physical therapist for advice.   BLOOD CLOT PREVENTION Take a 325 mg Aspirin two times a day for three weeks following surgery. Then take an 81 mg Aspirin once a day for three weeks. Then discontinue Aspirin. You may resume your vitamins/supplements upon discharge from the hospital. Do not take any NSAIDs (Advil, Aleve, Ibuprofen, Meloxicam, etc.) until you have discontinued the 325 mg Aspirin.  HOME CARE INSTRUCTIONS  Remove items at home which could result in a fall. This includes throw rugs or furniture in walking pathways.  ICE to the affected knee as much as tolerated. Icing helps control swelling. If the swelling is well controlled you will be more comfortable and rehab easier. Continue to use ice on the knee for pain and swelling from surgery. You may notice swelling that will progress down to the foot and ankle. This is normal after surgery. Elevate the leg when you are not up walking on it.    Continue to use the breathing machine which will help keep your temperature down. It is common for your temperature to cycle up and down following surgery, especially at night when you are not up moving around and exerting yourself. The breathing machine keeps your lungs expanded and your temperature  down. Do not place pillow under the operative knee, focus on keeping the knee straight while resting  DIET You may resume your previous home diet once you are discharged from the hospital.  DRESSING / WOUND CARE / SHOWERING Keep your bulky bandage on for 2 days. On the third post-operative day you may remove the Ace bandage and gauze. There is a waterproof adhesive bandage on your skin which will stay in place until your first follow-up appointment. Once you remove this you will not need to place another bandage You may begin showering 3 days following surgery, but do not submerge the incision under water.  ACTIVITY For the first 5 days, the key is rest and control of pain and swelling Do your home exercises twice a day starting on post-operative day 3. On the days you go to physical therapy, just do the home exercises once that day. You should rest, ice and elevate the leg for 50 minutes out of every hour. Get up and walk/stretch for 10 minutes per hour. After 5 days you can increase your activity slowly as tolerated. Walk with your walker as instructed. Use the walker until you are comfortable transitioning to a cane. Walk with the cane in the opposite hand of the operative leg. You may discontinue the cane once you are comfortable and walking steadily. Avoid periods of inactivity such as sitting longer than an hour when not asleep. This helps prevent blood clots.  You may discontinue the knee immobilizer once you are able to perform a straight leg raise while lying down. You may resume a sexual relationship in one month   or when given the OK by your doctor.  You may return to work once you are cleared by your doctor.  Do not drive a car for 6 weeks or until released by your surgeon.  Do not drive while taking narcotics.  TED HOSE STOCKINGS Wear the elastic stockings on both legs for three weeks following surgery during the day. You may remove them at night for sleeping.  WEIGHT  BEARING Weight bearing as tolerated with assist device (walker, cane, etc) as directed, use it as long as suggested by your surgeon or therapist, typically at least 4-6 weeks.  POSTOPERATIVE CONSTIPATION PROTOCOL Constipation - defined medically as fewer than three stools per week and severe constipation as less than one stool per week.  One of the most common issues patients have following surgery is constipation.  Even if you have a regular bowel pattern at home, your normal regimen is likely to be disrupted due to multiple reasons following surgery.  Combination of anesthesia, postoperative narcotics, change in appetite and fluid intake all can affect your bowels.  In order to avoid complications following surgery, here are some recommendations in order to help you during your recovery period.  Colace (docusate) - Pick up an over-the-counter form of Colace or another stool softener and take twice a day as long as you are requiring postoperative pain medications.  Take with a full glass of water daily.  If you experience loose stools or diarrhea, hold the colace until you stool forms back up. If your symptoms do not get better within 1 week or if they get worse, check with your doctor. Dulcolax (bisacodyl) - Pick up over-the-counter and take as directed by the product packaging as needed to assist with the movement of your bowels.  Take with a full glass of water.  Use this product as needed if not relieved by Colace only.  MiraLax (polyethylene glycol) - Pick up over-the-counter to have on hand. MiraLax is a solution that will increase the amount of water in your bowels to assist with bowel movements.  Take as directed and can mix with a glass of water, juice, soda, coffee, or tea. Take if you go more than two days without a movement. Do not use MiraLax more than once per day. Call your doctor if you are still constipated or irregular after using this medication for 7 days in a row.  If you continue  to have problems with postoperative constipation, please contact the office for further assistance and recommendations.  If you experience "the worst abdominal pain ever" or develop nausea or vomiting, please contact the office immediatly for further recommendations for treatment.  ITCHING If you experience itching with your medications, try taking only a single pain pill, or even half a pain pill at a time.  You can also use Benadryl over the counter for itching or also to help with sleep.   MEDICATIONS See your medication summary on the "After Visit Summary" that the nursing staff will review with you prior to discharge.  You may have some home medications which will be placed on hold until you complete the course of blood thinner medication.  It is important for you to complete the blood thinner medication as prescribed by your surgeon.  Continue your approved medications as instructed at time of discharge.  PRECAUTIONS If you experience chest pain or shortness of breath - call 911 immediately for transfer to the hospital emergency department.  If you develop a fever greater that 101 F,   purulent drainage from wound, increased redness or drainage from wound, foul odor from the wound/dressing, or calf pain - CONTACT YOUR SURGEON.                                                   FOLLOW-UP APPOINTMENTS Make sure you keep all of your appointments after your operation with your surgeon and caregivers. You should call the office at the above phone number and make an appointment for approximately two weeks after the date of your surgery or on the date instructed by your surgeon outlined in the "After Visit Summary".  RANGE OF MOTION AND STRENGTHENING EXERCISES  Rehabilitation of the knee is important following a knee injury or an operation. After just a few days of immobilization, the muscles of the thigh which control the knee become weakened and shrink (atrophy). Knee exercises are designed to build up  the tone and strength of the thigh muscles and to improve knee motion. Often times heat used for twenty to thirty minutes before working out will loosen up your tissues and help with improving the range of motion but do not use heat for the first two weeks following surgery. These exercises can be done on a training (exercise) mat, on the floor, on a table or on a bed. Use what ever works the best and is most comfortable for you Knee exercises include:  Leg Lifts - While your knee is still immobilized in a splint or cast, you can do straight leg raises. Lift the leg to 60 degrees, hold for 3 sec, and slowly lower the leg. Repeat 10-20 times 2-3 times daily. Perform this exercise against resistance later as your knee gets better.  Quad and Hamstring Sets - Tighten up the muscle on the front of the thigh (Quad) and hold for 5-10 sec. Repeat this 10-20 times hourly. Hamstring sets are done by pushing the foot backward against an object and holding for 5-10 sec. Repeat as with quad sets.  Leg Slides: Lying on your back, slowly slide your foot toward your buttocks, bending your knee up off the floor (only go as far as is comfortable). Then slowly slide your foot back down until your leg is flat on the floor again. Angel Wings: Lying on your back spread your legs to the side as far apart as you can without causing discomfort.  A rehabilitation program following serious knee injuries can speed recovery and prevent re-injury in the future due to weakened muscles. Contact your doctor or a physical therapist for more information on knee rehabilitation.   POST-OPERATIVE OPIOID TAPER INSTRUCTIONS: It is important to wean off of your opioid medication as soon as possible. If you do not need pain medication after your surgery it is ok to stop day one. Opioids include: Codeine, Hydrocodone(Norco, Vicodin), Oxycodone(Percocet, oxycontin) and hydromorphone amongst others.  Long term and even short term use of opiods can  cause: Increased pain response Dependence Constipation Depression Respiratory depression And more.  Withdrawal symptoms can include Flu like symptoms Nausea, vomiting And more Techniques to manage these symptoms Hydrate well Eat regular healthy meals Stay active Use relaxation techniques(deep breathing, meditating, yoga) Do Not substitute Alcohol to help with tapering If you have been on opioids for less than two weeks and do not have pain than it is ok to stop all together.  Plan   to wean off of opioids This plan should start within one week post op of your joint replacement. Maintain the same interval or time between taking each dose and first decrease the dose.  Cut the total daily intake of opioids by one tablet each day Next start to increase the time between doses. The last dose that should be eliminated is the evening dose.   IF YOU ARE TRANSFERRED TO A SKILLED REHAB FACILITY If the patient is transferred to a skilled rehab facility following release from the hospital, a list of the current medications will be sent to the facility for the patient to continue.  When discharged from the skilled rehab facility, please have the facility set up the patient's Home Health Physical Therapy prior to being released. Also, the skilled facility will be responsible for providing the patient with their medications at time of release from the facility to include their pain medication, the muscle relaxants, and their blood thinner medication. If the patient is still at the rehab facility at time of the two week follow up appointment, the skilled rehab facility will also need to assist the patient in arranging follow up appointment in our office and any transportation needs.  MAKE SURE YOU:  Understand these instructions.  Get help right away if you are not doing well or get worse.   DENTAL ANTIBIOTICS:  In most cases prophylactic antibiotics for Dental procdeures after total joint surgery are  not necessary.  Exceptions are as follows:  1. History of prior total joint infection  2. Severely immunocompromised (Organ Transplant, cancer chemotherapy, Rheumatoid biologic meds such as Humera)  3. Poorly controlled diabetes (A1C &gt; 8.0, blood glucose over 200)  If you have one of these conditions, contact your surgeon for an antibiotic prescription, prior to your dental procedure.    Pick up stool softner and laxative for home use following surgery while on pain medications. Do not submerge incision under water. Please use good hand washing techniques while changing dressing each day. May shower starting three days after surgery. Please use a clean towel to pat the incision dry following showers. Continue to use ice for pain and swelling after surgery. Do not use any lotions or creams on the incision until instructed by your surgeon.  

## 2020-12-03 DIAGNOSIS — M1711 Unilateral primary osteoarthritis, right knee: Secondary | ICD-10-CM | POA: Diagnosis not present

## 2020-12-03 LAB — CBC
HCT: 33.6 % — ABNORMAL LOW (ref 39.0–52.0)
Hemoglobin: 11.9 g/dL — ABNORMAL LOW (ref 13.0–17.0)
MCH: 33.4 pg (ref 26.0–34.0)
MCHC: 35.4 g/dL (ref 30.0–36.0)
MCV: 94.4 fL (ref 80.0–100.0)
Platelets: 232 10*3/uL (ref 150–400)
RBC: 3.56 MIL/uL — ABNORMAL LOW (ref 4.22–5.81)
RDW: 12.2 % (ref 11.5–15.5)
WBC: 12.4 10*3/uL — ABNORMAL HIGH (ref 4.0–10.5)
nRBC: 0 % (ref 0.0–0.2)

## 2020-12-03 LAB — BASIC METABOLIC PANEL
Anion gap: 9 (ref 5–15)
BUN: 14 mg/dL (ref 6–20)
CO2: 22 mmol/L (ref 22–32)
Calcium: 8.3 mg/dL — ABNORMAL LOW (ref 8.9–10.3)
Chloride: 103 mmol/L (ref 98–111)
Creatinine, Ser: 0.89 mg/dL (ref 0.61–1.24)
GFR, Estimated: >60 mL/min (ref >60)
Glucose, Bld: 359 mg/dL — ABNORMAL HIGH (ref 70–99)
Potassium: 3.9 mmol/L (ref 3.5–5.1)
Sodium: 134 mmol/L — ABNORMAL LOW (ref 135–145)

## 2020-12-03 MED ORDER — TRAMADOL HCL 50 MG PO TABS: 50.0000 mg | ORAL_TABLET | Freq: Four times a day (QID) | ORAL | 0 refills | Status: DC | PRN

## 2020-12-03 MED ORDER — ASPIRIN 325 MG PO TBEC: 325.0000 mg | DELAYED_RELEASE_TABLET | Freq: Two times a day (BID) | ORAL | 0 refills | Status: AC

## 2020-12-03 MED ORDER — OXYCODONE HCL 5 MG PO TABS: 5.0000 mg | ORAL_TABLET | Freq: Four times a day (QID) | ORAL | 0 refills | Status: DC | PRN

## 2020-12-03 MED ORDER — METHOCARBAMOL 500 MG PO TABS: 500.0000 mg | ORAL_TABLET | Freq: Four times a day (QID) | ORAL | 0 refills | Status: DC | PRN

## 2020-12-03 MED ORDER — GABAPENTIN 300 MG PO CAPS: ORAL_CAPSULE | ORAL | 0 refills | Status: DC

## 2020-12-03 NOTE — Evaluation (Signed)
Physical Therapy Evaluation Patient Details Name: James Robinson MRN: [id number] DOB: 12/14/1960 Today's Date: 12/03/2020  History of Present Illness  60 y.o. male admitted 12/02/20 for R TKA. PMH includes depression, HTN, migraines.  Clinical Impression  Pt ambulated 130' with RW, no loss of balance. He demonstrates good understanding of HEP. HE is ready to DC home from a PT standpoint.         Recommendations for follow up therapy are one component of a multi-disciplinary discharge planning process, led by the attending physician.  Recommendations may be updated based on patient status, additional functional criteria and insurance authorization.  Follow Up Recommendations Outpatient PT    Assistance Recommended at Discharge Set up Supervision/Assistance  Functional Status Assessment Patient has not had a recent decline in their functional status  Equipment Recommendations  Rolling walker (2 wheels)    Recommendations for Other Services       Precautions / Restrictions Precautions Precautions: Knee Precaution Booklet Issued: Yes (comment) Precaution Comments: reviewed no pillow under knee Restrictions Weight Bearing Restrictions: No      Mobility  Bed Mobility Overal bed mobility: Modified Independent             General bed mobility comments: HOB up, used rail    Transfers Overall transfer level: Needs assistance Equipment used: Rolling walker (2 wheels) Transfers: Sit to/from Stand Sit to Stand: Supervision           General transfer comment: VCs hand placement    Ambulation/Gait Ambulation/Gait assistance: Supervision Gait Distance (Feet): 130 Feet Assistive device: Rolling walker (2 wheels) Gait Pattern/deviations: Step-to pattern;Decreased step length - right;Decreased step length - left Gait velocity: WFL   General Gait Details: VCs sequencing, no loss of balance  Stairs            Wheelchair Mobility    Modified Rankin (Stroke  Patients Only)       Balance Overall balance assessment: Modified Independent                                           Pertinent Vitals/Pain Pain Assessment: 0-10 Pain Score: 5  Pain Location: R knee/thigh Pain Descriptors / Indicators: Sore Pain Intervention(s): Limited activity within patient's tolerance;Monitored during session;Premedicated before session;Ice applied    Home Living Family/patient expects to be discharged to:: Private residence Living Arrangements: Alone Available Help at Discharge: Family;Available 24 hours/day   Home Access: Level entry       Home Layout: One level Home Equipment: Rollator (4 wheels);Cane - single point Additional Comments: sister to assist at home    Prior Function Prior Level of Function : Independent/Modified Independent             Mobility Comments: used cane PRN       Hand Dominance        Extremity/Trunk Assessment   Upper Extremity Assessment Upper Extremity Assessment: Overall WFL for tasks assessed    Lower Extremity Assessment Lower Extremity Assessment: RLE deficits/detail RLE Deficits / Details: +2/5 SLR, knee AAROM 5-50* RLE Coordination: WNL    Cervical / Trunk Assessment Cervical / Trunk Assessment: Normal  Communication   Communication: No difficulties  Cognition Arousal/Alertness: Awake/alert Behavior During Therapy: WFL for tasks assessed/performed                                              General Comments      Exercises Total Joint Exercises Ankle Circles/Pumps: AROM;Both;10 reps;Supine Quad Sets: AROM;Both;5 reps;Supine Short Arc Quad: AROM;Right;10 reps;Supine Heel Slides: AAROM;Right;10 reps;Supine Hip ABduction/ADduction: AROM;Right;10 reps;Supine Straight Leg Raises: AAROM;Right;5 reps;Supine Long Arc Quad: AROM;Right;5 reps;Seated Knee Flexion: AAROM;Right;10 reps;Seated Goniometric ROM: 5-50* AAROM R knee   Assessment/Plan    PT  Assessment All further PT needs can be met in the next venue of care  PT Problem List         PT Treatment Interventions      PT Goals (Current goals can be found in the Care Plan section)  Acute Rehab PT Goals 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 PT Goal Formulation: All assessment and education complete, DC therapy    Frequency     Barriers to discharge        Co-evaluation               AM-PAC PT "6 Clicks" Mobility  Outcome Measure Help needed turning from your back to your side while in a flat bed without using bedrails?: None Help needed moving from lying on your back to sitting on the side of a flat bed without using bedrails?: None Help needed moving to and from a bed to a chair (including a wheelchair)?: None Help needed standing up from a chair using your arms (e.g., wheelchair or bedside chair)?: None Help needed to walk in hospital room?: None Help needed climbing 3-5 steps with a railing? : A Little 6 Click Score: 23    End of Session Equipment Utilized During Treatment: Gait belt Activity Tolerance: Patient tolerated treatment well Patient left: in chair;with call bell/phone within reach;with chair alarm set Nurse Communication: Mobility status      Time: 0814-4818 PT Time Calculation (min) (ACUTE ONLY): 33 min   Charges:   PT Evaluation $PT Eval Low Complexity: 1 Low PT Treatments $Gait Training: 8-22 mins       Blondell Reveal Kistler PT 12/03/2020  Acute Rehabilitation Services Pager [phone] Office [phone]

## 2020-12-03 NOTE — Progress Notes (Signed)
   Subjective: 1 Day Post-Op Procedure(s) (LRB): TOTAL KNEE ARTHROPLASTY (Right) Patient reports pain as mild.   Patient seen in rounds by Dr. Lequita Halt. Patient is well, and has had no acute complaints or problems other than soreness in the right knee. No issues overnight. Foley catheter removed this AM. We will begin therapy today.   Objective: Vital signs in last 24 hours: Temp:  [97.4 F (36.3 C)-98.2 F (36.8 C)] 98.2 F (36.8 C) (10/25 0544) Pulse Rate:  [57-89] 89 (10/25 0544) Resp:  [8-21] 18 (10/25 0544) BP: (108-184)/(63-90) 149/76 (10/25 0544) SpO2:  [87 %-100 %] 100 % (10/25 0544) Weight:  [104.5 kg] 104.5 kg (10/24 1032)  Intake/Output from previous day:  Intake/Output Summary (Last 24 hours) at 12/03/2020 0750 Last data filed at 12/03/2020 0600 Gross per 24 hour  Intake 3906.97 ml  Output 3400 ml  Net 506.97 ml     Intake/Output this shift: No intake/output data recorded.  Labs: Recent Labs    12/03/20 0323  HGB 11.9*   Recent Labs    12/03/20 0323  WBC 12.4*  RBC 3.56*  HCT 33.6*  PLT 232   Recent Labs    12/03/20 0323  NA 134*  K 3.9  CL 103  CO2 22  BUN 14  CREATININE 0.89  GLUCOSE 359*  CALCIUM 8.3*   No results for input(s): LABPT, INR in the last 72 hours.  Exam: General - Patient is Alert and Oriented Extremity - Neurologically intact Neurovascular intact Sensation intact distally Dorsiflexion/Plantar flexion intact Dressing - dressing C/D/I Motor Function - intact, moving foot and toes well on exam.   Past Medical History:  Diagnosis Date   Anxiety    Arthritis    Depression    Hypertension Dx 2008   on BP medicine for a brief period    Migraine Dx 1985    Assessment/Plan: 1 Day Post-Op Procedure(s) (LRB): TOTAL KNEE ARTHROPLASTY (Right) Principal Problem:   OA (osteoarthritis) of knee Active Problems:   Primary osteoarthritis of right knee  Estimated body mass index is 32.59 kg/m as calculated from the  following:   Height as of this encounter: 5' 10.5" (1.791 m).   Weight as of this encounter: 104.5 kg. Advance diet Up with therapy D/C IV fluids   Patient's anticipated LOS is less than 2 midnights, meeting these requirements: - Younger than 46 - Lives within 1 hour of care - Has a competent adult at home to recover with post-op recover - NO history of  - Chronic pain requiring opioids  - Diabetes  - Coronary Artery Disease  - Heart failure  - Heart attack  - Stroke  - DVT/VTE  - Cardiac arrhythmia  - Respiratory Failure/COPD  - Renal failure  - Anemia  - Advanced Liver disease  DVT Prophylaxis - Aspirin Weight bearing as tolerated. Begin therapy.  Plan is to go Home after hospital stay. Possible discharge this afternoon if progresses with therapy and meeting goals. Will have some help at home, but will have a low threshold today for keeping an additional night if needed to ensure he has maximum mobility prior to discharge. Scheduled for OPPT at [facility] Follow-up in the office in 2 weeks  The PDMP database was reviewed today prior to any opioid medications being prescribed to this patient.  Arther Abbott, PA-C Orthopedic Surgery [phone] 12/03/2020, 7:50 AM

## 2020-12-03 NOTE — Plan of Care (Signed)
  Problem: Education: Goal: Knowledge of the prescribed therapeutic regimen will improve Outcome: Adequate for Discharge Goal: Individualized Educational Video(s) Outcome: Adequate for Discharge   Problem: Activity: Goal: Ability to avoid complications of mobility impairment will improve Outcome: Adequate for Discharge Goal: Range of joint motion will improve Outcome: Adequate for Discharge   Problem: Clinical Measurements: Goal: Postoperative complications will be avoided or minimized Outcome: Adequate for Discharge   Problem: Pain Management: Goal: Pain level will decrease with appropriate interventions Outcome: Adequate for Discharge   Problem: Skin Integrity: Goal: Will show signs of wound healing Outcome: Adequate for Discharge  Discharge teaching done, written information given.

## 2020-12-03 NOTE — TOC Transition Note (Addendum)
Transition of Care [facility]) - CM/SW Discharge Note  Patient Details  Name: James Robinson MRN: [id number] Date of Birth: [birth year]  [facility] CM/SW Contact:  Sherie Don, LCSW Phone Number: 12/03/2020, 11:30 AM  Clinical Narrative: Patient is expected to discharge home after working with PT. CSW met with patient to review discharge plans and needs. Patient will discharge home with OPPT at [facility]. Patient has a rollator, BSC, shower bench, toilet riser, and cane at home but will need a regular rolling walker per PT. Patient is agreeable to DME referral as his rollator is secondhand. CSW made referral to [facility] with [employer]. [employer] to deliver walker to patient's room. TOC signing off.  Addendum: Patient's friend will lend him a rolling walker. CSW called [employer] to cancel delivery.  Final next level of care: OP Rehab Barriers to Discharge: No Barriers Identified  Patient Goals and CMS Choice Patient states their goals for this hospitalization and ongoing recovery are:: Discharge home with [place] CMS Medicare.gov Compare Post Acute Care list provided to:: Patient Choice offered to / list presented to : Patient  Discharge Plan and Services         DME Arranged: Walker rolling DME Agency: [employer] Date DME Agency Contacted: 12/03/20 Time DME Agency Contacted: [id number] Representative spoke with at DME Agency: Zelphia Cairo  Readmission Risk Interventions No flowsheet data found.

## 2020-12-04 ENCOUNTER — Encounter (HOSPITAL_COMMUNITY): Payer: Self-pay | Admitting: Orthopedic Surgery

## 2020-12-04 ENCOUNTER — Telehealth: Payer: Self-pay

## 2020-12-04 NOTE — Telephone Encounter (Signed)
Transition Care Management Follow-up Telephone Call Date of discharge and from where: 12/03/2020, [facility] How have you been since you were released from the hospital? He said he is feeling okay considering the excruciating pain in his right knee. He said he has been doing his exercises as instructed and has been walking for 10-15 minutes every hour. As well as applying ices and taking pain medications as ordered.  Any questions or concerns? No  Items Reviewed: Did the pt receive and understand the discharge instructions provided? Yes  Medications obtained and verified? Yes - he said he has all medications, including the new ones and he did not have any questions about the med regime.  Other? No  Any new allergies since your discharge? No  Do you have support at home?  Lives alone but said he has people who provide assistance.   Home Care and Equipment/Supplies: Were home health services ordered? no If so, what is the name of the agency? N/a  Has the agency set up a time to come to the patient's home? not applicable Were any new equipment or medical supplies ordered?  Yes: RW What is the name of the medical supply agency? [employer]  Were you able to get the supplies/equipment? yes Do you have any questions related to the use of the equipment or supplies? No  He also has a rollator, BSC , shower chair and cane.   He removed the bulky dressing on his right knee but the adhesive bandage remains intact   Functional Questionnaire: (I = Independent and D = Dependent) ADLs: independent. Ambulating with RW, WBAT RLE.     Follow up appointments reviewed:  PCP Hospital f/u appt confirmed? No  -he just saw his PCP and said he will call to schedule an appointment when needed.  Specialist Hospital f/u appt confirmed? Yes  Scheduled to see [facility]  on tomorrow- 12/05/2020. Are transportation arrangements needed? No  - he said he has someone who can take him to his appt  tomorrow.  If their condition worsens, is the pt aware to call PCP or go to the Emergency Dept.? Yes Was the patient provided with contact information for the PCP's office or ED? Yes Was to pt encouraged to call back with questions or concerns? Yes

## 2020-12-09 NOTE — Discharge Summary (Signed)
Physician Discharge Summary   Patient ID: James Robinson MRN: [id number] DOB/AGE: 59/19/1962 59 y.o.  Admit date: 12/02/2020 Discharge date: 12/03/2020  Primary Diagnosis: Osteoarthritis, right knee   Admission Diagnoses:  Past Medical History:  Diagnosis Date   Anxiety    Arthritis    Depression    Hypertension Dx 2008   on BP medicine for a brief period    Migraine Dx 1985   Discharge Diagnoses:   Principal Problem:   OA (osteoarthritis) of knee Active Problems:   Primary osteoarthritis of right knee  Estimated body mass index is 32.59 kg/m as calculated from the following:   Height as of this encounter: 5' 10.5" (1.791 m).   Weight as of this encounter: 104.5 kg.  Procedure:  Procedure(s) (LRB): TOTAL KNEE ARTHROPLASTY (Right)   Consults: None  HPI: James Robinson is a 59 y.o. year old male with end stage OA of his right knee with progressively worsening pain and dysfunction. He has constant pain, with activity and at rest and significant functional deficits with difficulties even with ADLs. He has had extensive non-op management including analgesics, injections of cortisone, and home exercise program, but remains in significant pain with significant dysfunction. Radiographs show bone on bone arthritis medial compartment with severe varus deformity . He presents now for right Total Knee Arthroplasty.     Laboratory Data: Admission on 12/02/2020, Discharged on 12/03/2020  Component Date Value Ref Range Status   WBC 12/03/2020 12.4 (A)  4.0 - 10.5 K/uL Final   RBC 12/03/2020 3.56 (A)  4.22 - 5.81 MIL/uL Final   Hemoglobin 12/03/2020 11.9 (A)  13.0 - 17.0 g/dL Final   HCT 12/03/2020 33.6 (A)  39.0 - 52.0 % Final   MCV 12/03/2020 94.4  80.0 - 100.0 fL Final   MCH 12/03/2020 33.4  26.0 - 34.0 pg Final   MCHC 12/03/2020 35.4  30.0 - 36.0 g/dL Final   RDW 12/03/2020 12.2  11.5 - 15.5 % Final   Platelets 12/03/2020 232  150 - 400 K/uL Final   nRBC 12/03/2020 0.0  0.0 -  0.2 % Final   Performed at [facility], [place] [address], [place], [place] [address]   Sodium 12/03/2020 134 (A)  135 - 145 mmol/L Final   Potassium 12/03/2020 3.9  3.5 - 5.1 mmol/L Final   Chloride 12/03/2020 103  98 - 111 mmol/L Final   CO2 12/03/2020 22  22 - 32 mmol/L Final   Glucose, Bld 12/03/2020 359 (A)  70 - 99 mg/dL Final   Glucose reference range applies only to samples taken after fasting for at least 8 hours.   BUN 12/03/2020 14  6 - 20 mg/dL Final   Creatinine, Ser 12/03/2020 0.89  0.61 - 1.24 mg/dL Final   Calcium 12/03/2020 8.3 (A)  8.9 - 10.3 mg/dL Final   GFR, Estimated 12/03/2020 >60  >60 mL/min Final   Comment: (NOTE) Calculated using the CKD-EPI Creatinine Equation (2021)    Anion gap 12/03/2020 9  5 - 15 Final   Performed at [facility], [place] [address] [place], [place] [address]  Orders Only on 11/28/2020  Component Date Value Ref Range Status   SARS Coronavirus 2 11/28/2020 RESULT: NEGATIVE   Final   Comment: RESULT: NEGATIVESARS-CoV-2 INTERPRETATION:A NEGATIVE  test result means that SARS-CoV-2 RNA was not present in the specimen above the limit of detection of this test. This does not preclude a possible SARS-CoV-2 infection and should not be used  as the  sole basis for patient management decisions. Negative results must be combined with clinical observations, patient history, and epidemiological information. Optimum specimen types and timing for peak viral levels during infections caused by SARS-CoV-2  have not been determined. Collection of multiple specimens or types of specimens may be necessary to detect virus. Improper specimen collection and handling, sequence variability under primers/probes, or organism present below the limit of detection may  lead to false negative results. Positive and negative predictive values of testing are highly dependent on prevalence. False negative test results are more likely when prevalence  of disease is high.The expected result is NEGATIVE.Fact S                          heet for  Healthcare Providers: LocalChronicle.no Sheet for Patients: SalonLookup.es Reference Range - Negative   Hospital Outpatient Visit on 11/22/2020  Component Date Value Ref Range Status   MRSA, PCR 11/22/2020 NEGATIVE  NEGATIVE Final   Staphylococcus aureus 11/22/2020 POSITIVE (A)  NEGATIVE Final   Comment: (NOTE) The Xpert SA Assay (FDA approved for NASAL specimens in patients 8 years of age and older), is one component of a comprehensive surveillance program. It is not intended to diagnose infection nor to guide or monitor treatment. Performed at [facility], [place] [address], [place], [place] [address]    WBC 11/22/2020 7.3  4.0 - 10.5 K/uL Final   RBC 11/22/2020 3.86 (A)  4.22 - 5.81 MIL/uL Final   Hemoglobin 11/22/2020 12.7 (A)  13.0 - 17.0 g/dL Final   HCT 11/22/2020 35.8 (A)  39.0 - 52.0 % Final   MCV 11/22/2020 92.7  80.0 - 100.0 fL Final   MCH 11/22/2020 32.9  26.0 - 34.0 pg Final   MCHC 11/22/2020 35.5  30.0 - 36.0 g/dL Final   RDW 11/22/2020 12.4  11.5 - 15.5 % Final   Platelets 11/22/2020 264  150 - 400 K/uL Final   nRBC 11/22/2020 0.0  0.0 - 0.2 % Final   Performed at [facility], [place] [address], [place], [place] [address]   Sodium 11/22/2020 135  135 - 145 mmol/L Final   Potassium 11/22/2020 3.5  3.5 - 5.1 mmol/L Final   Chloride 11/22/2020 102  98 - 111 mmol/L Final   CO2 11/22/2020 26  22 - 32 mmol/L Final   Glucose, Bld 11/22/2020 153 (A)  70 - 99 mg/dL Final   Glucose reference range applies only to samples taken after fasting for at least 8 hours.   BUN 11/22/2020 13  6 - 20 mg/dL Final   Creatinine, Ser 11/22/2020 0.75  0.61 - 1.24 mg/dL Final   Calcium 11/22/2020 9.0  8.9 - 10.3 mg/dL Final   Total Protein 11/22/2020 7.6  6.5 - 8.1 g/dL Final   Albumin 11/22/2020 3.8  3.5  - 5.0 g/dL Final   AST 11/22/2020 19  15 - 41 U/L Final   ALT 11/22/2020 29  0 - 44 U/L Final   Alkaline Phosphatase 11/22/2020 79  38 - 126 U/L Final   Total Bilirubin 11/22/2020 0.9  0.3 - 1.2 mg/dL Final   GFR, Estimated 11/22/2020 >60  >60 mL/min Final   Comment: (NOTE) Calculated using the CKD-EPI Creatinine Equation (2021)    Anion gap 11/22/2020 7  5 - 15 Final   Performed at [facility], [place] [address], [place], [place] [address]   Prothrombin Time 11/22/2020 13.6  11.4 -  15.2 seconds Final   INR 11/22/2020 1.0  0.8 - 1.2 Final   Comment: (NOTE) INR goal varies based on device and disease states. Performed at [facility], [place] [address] [place], [place] [address]   Office Visit on 11/18/2020  Component Date Value Ref Range Status   WBC 11/18/2020 6.6  3.4 - 10.8 x10E3/uL Final   RBC 11/18/2020 4.02 (A)  4.14 - 5.80 x10E6/uL Final   Hemoglobin 11/18/2020 13.1  13.0 - 17.7 g/dL Final   Hematocrit 11/18/2020 37.7  37.5 - 51.0 % Final   MCV 11/18/2020 94  79 - 97 fL Final   MCH 11/18/2020 32.6  26.6 - 33.0 pg Final   MCHC 11/18/2020 34.7  31.5 - 35.7 g/dL Final   RDW 11/18/2020 12.4  11.6 - 15.4 % Final   Platelets 11/18/2020 268  150 - 450 x10E3/uL Final   Neutrophils 11/18/2020 56  Not Estab. % Final   Lymphs 11/18/2020 27  Not Estab. % Final   Monocytes 11/18/2020 11  Not Estab. % Final   Eos 11/18/2020 5  Not Estab. % Final   Basos 11/18/2020 1  Not Estab. % Final   Neutrophils Absolute 11/18/2020 3.8  1.4 - 7.0 x10E3/uL Final   Lymphocytes Absolute 11/18/2020 1.8  0.7 - 3.1 x10E3/uL Final   Monocytes Absolute 11/18/2020 0.7  0.1 - 0.9 x10E3/uL Final   EOS (ABSOLUTE) 11/18/2020 0.3  0.0 - 0.4 x10E3/uL Final   Basophils Absolute 11/18/2020 0.0  0.0 - 0.2 x10E3/uL Final   Immature Granulocytes 11/18/2020 0  Not Estab. % Final   Immature Grans (Abs) 11/18/2020 0.0  0.0 - 0.1 x10E3/uL Final   Glucose 11/18/2020 196 (A)  70 - 99  mg/dL Final                 **Please note reference interval change**   BUN 11/18/2020 14  6 - 24 mg/dL Final   Creatinine, Ser 11/18/2020 0.90  0.76 - 1.27 mg/dL Final   eGFR 11/18/2020 98  >59 mL/min/1.73 Final   BUN/Creatinine Ratio 11/18/2020 16  9 - 20 Final   Sodium 11/18/2020 137  134 - 144 mmol/L Final   Potassium 11/18/2020 4.3  3.5 - 5.2 mmol/L Final   Chloride 11/18/2020 101  96 - 106 mmol/L Final   CO2 11/18/2020 24  20 - 29 mmol/L Final   Calcium 11/18/2020 9.0  8.7 - 10.2 mg/dL Final   Total Protein 11/18/2020 7.3  6.0 - 8.5 g/dL Final   Albumin 11/18/2020 4.3  3.8 - 4.9 g/dL Final   Globulin, Total 11/18/2020 3.0  1.5 - 4.5 g/dL Final   Albumin/Globulin Ratio 11/18/2020 1.4  1.2 - 2.2 Final   Bilirubin Total 11/18/2020 0.4  0.0 - 1.2 mg/dL Final   Alkaline Phosphatase 11/18/2020 104  44 - 121 IU/L Final   AST 11/18/2020 20  0 - 40 IU/L Final   ALT 11/18/2020 27  0 - 44 IU/L Final   Cholesterol, Total 11/18/2020 148  100 - 199 mg/dL Final   Triglycerides 11/18/2020 122  0 - 149 mg/dL Final   HDL 11/18/2020 40  >39 mg/dL Final   VLDL Cholesterol Cal 11/18/2020 22  5 - 40 mg/dL Final   LDL Chol Calc (NIH) 11/18/2020 86  0 - 99 mg/dL Final   Chol/HDL Ratio 11/18/2020 3.7  0.0 - 5.0 ratio Final   Comment:  T. Chol/HDL Ratio                                             Men  Women                               1/2 Avg.Risk  3.4    3.3                                   Avg.Risk  5.0    4.4                                2X Avg.Risk  9.6    7.1                                3X Avg.Risk 23.4   11.0    INR 11/18/2020 1.0  0.9 - 1.2 Final   Comment: Reference interval is for non-anticoagulated patients. Suggested INR therapeutic range for Vitamin K antagonist therapy:    Standard Dose (moderate intensity                   therapeutic range):       2.0 - 3.0    Higher intensity therapeutic range       2.5 - 3.5    Prothrombin Time 11/18/2020  10.2  9.1 - 12.0 sec Final     X-Rays:No results found.  EKG: Orders placed or performed in visit on 11/18/20   EKG 12-Lead     Hospital Course: James Robinson is a 60 y.o. who was admitted to [facility]. They were brought to the operating room on 12/02/2020 and underwent Procedure(s): TOTAL KNEE ARTHROPLASTY.  Patient tolerated the procedure well and was later transferred to the recovery room and then to the orthopaedic floor for postoperative care. They were given PO and IV analgesics for pain control following their surgery. They were given 24 hours of postoperative antibiotics of  Anti-infectives (From admission, onward)    Start     Dose/Rate Route Frequency Ordered Stop   12/02/20 1900  ceFAZolin (ANCEF) IVPB 2g/100 mL premix        2 g 200 mL/hr over 30 Minutes Intravenous Every 6 hours 12/02/20 1643 12/03/20 0201   12/02/20 1015  ceFAZolin (ANCEF) IVPB 2g/100 mL premix        2 g 200 mL/hr over 30 Minutes Intravenous On call to O.R. 12/02/20 1005 12/02/20 1321      and started on DVT prophylaxis in the form of Aspirin.   PT and OT were ordered for total joint protocol. Discharge planning consulted to help with postop disposition and equipment needs.  Patient had a good night on the evening of surgery. They started to get up OOB with therapy on POD #1. Pt was seen during rounds and was ready to go home pending progress with therapy. He worked with therapy on POD #1 and was meeting his goals. Pt was discharged to home later that day in stable condition.  Diet: Regular diet Activity: WBAT Follow-up: in 2 weeks Disposition: Home with OPPT Discharged Condition: stable  Discharge Instructions     Call MD / Call 911   Complete by: As directed    If you experience chest pain or shortness of breath, CALL 911 and be transported to the hospital emergency room.  If you develope a fever above 101 F, pus (white drainage) or increased drainage or redness at the wound, or calf  pain, call your surgeon's office.   Change dressing   Complete by: As directed    You may remove the bulky bandage (ACE wrap and gauze) two days after surgery. You will have an adhesive waterproof bandage underneath. Leave this in place until your first follow-up appointment.   Constipation Prevention   Complete by: As directed    Drink plenty of fluids.  Prune juice may be helpful.  You may use a stool softener, such as Colace (over the counter) 100 mg twice a day.  Use MiraLax (over the counter) for constipation as needed.   Diet - low sodium heart healthy   Complete by: As directed    Do not put a pillow under the knee. Place it under the heel.   Complete by: As directed    Driving restrictions   Complete by: As directed    No driving for two weeks   Post-operative opioid taper instructions:   Complete by: As directed    POST-OPERATIVE OPIOID TAPER INSTRUCTIONS: It is important to wean off of your opioid medication as soon as possible. If you do not need pain medication after your surgery it is ok to stop day one. Opioids include: Codeine, Hydrocodone(Norco, Vicodin), Oxycodone(Percocet, oxycontin) and hydromorphone amongst others.  Long term and even short term use of opiods can cause: Increased pain response Dependence Constipation Depression Respiratory depression And more.  Withdrawal symptoms can include Flu like symptoms Nausea, vomiting And more Techniques to manage these symptoms Hydrate well Eat regular healthy meals Stay active Use relaxation techniques(deep breathing, meditating, yoga) Do Not substitute Alcohol to help with tapering If you have been on opioids for less than two weeks and do not have pain than it is ok to stop all together.  Plan to wean off of opioids This plan should start within one week post op of your joint replacement. Maintain the same interval or time between taking each dose and first decrease the dose.  Cut the total daily intake of  opioids by one tablet each day Next start to increase the time between doses. The last dose that should be eliminated is the evening dose.      TED hose   Complete by: As directed    Use stockings (TED hose) for three weeks on both leg(s).  You may remove them at night for sleeping.   Weight bearing as tolerated   Complete by: As directed       Allergies as of 12/03/2020   No Known Allergies      Medication List     STOP taking these medications    atorvastatin 20 MG tablet Commonly known as: LIPITOR   hydrochlorothiazide 25 MG tablet Commonly known as: HYDRODIURIL   naproxen 500 MG tablet Commonly known as: NAPROSYN   naproxen sodium 220 MG tablet Commonly known as: ALEVE       TAKE these medications    amLODipine 10 MG tablet Commonly known as: NORVASC Take 1 tablet (10 mg total) by mouth daily.   aspirin 325 MG EC tablet Take 1 tablet (325 mg total) by mouth 2 (two) times daily for 20  days. Then take one 81 mg aspirin once a day for three weeks. Then discontinue aspirin.   gabapentin 300 MG capsule Commonly known as: NEURONTIN Take a 300 mg capsule three times a day for two weeks following surgery.Then take a 300 mg capsule two times a day for two weeks. Then take a 300 mg capsule once a day for two weeks. Then discontinue.   methocarbamol 500 MG tablet Commonly known as: ROBAXIN Take 1 tablet (500 mg total) by mouth every 6 (six) hours as needed for muscle spasms.   oxyCODONE 5 MG immediate release tablet Commonly known as: Oxy IR/ROXICODONE Take 1-2 tablets (5-10 mg total) by mouth every 6 (six) hours as needed for severe pain. Not to exceed 6 tablets a day.   PREDNISONE PO Take 1 tablet by mouth daily as needed (lower cholesterol).   traMADol 50 MG tablet Commonly known as: ULTRAM Take 1-2 tablets (50-100 mg total) by mouth every 6 (six) hours as needed for moderate pain.               Discharge Care Instructions  (From admission,  onward)           Start     Ordered   12/03/20 0000  Weight bearing as tolerated        12/03/20 0754   12/03/20 0000  Change dressing       Comments: You may remove the bulky bandage (ACE wrap and gauze) two days after surgery. You will have an adhesive waterproof bandage underneath. Leave this in place until your first follow-up appointment.   12/03/20 0754            Follow-up Information     Gaynelle Arabian, MD Follow up in 2 week(s).   Specialty: Orthopedic Surgery Contact information: [address] [place] [address] [phone]                 Signed: Theresa Duty, PA-C Orthopedic Surgery 12/09/2020, 7:34 AM

## 2021-01-03 ENCOUNTER — Other Ambulatory Visit (INDEPENDENT_AMBULATORY_CARE_PROVIDER_SITE_OTHER): Payer: Self-pay | Admitting: Primary Care

## 2021-01-03 NOTE — Telephone Encounter (Signed)
Medication Refill - Medication: oxyCODONE (OXY IR/ROXICODONE) 5 MG immediate release tablet  Pt asked if the strength on this can be increased / 5mg  does nothing for the pain from his knee replacement /please advise   traMADol (ULTRAM) 50 MG tablet     Has the patient contacted their pharmacy? No. (Agent: If no, request that the patient contact the pharmacy for the refill. If patient does not wish to contact the pharmacy document the reason why and proceed with request.) (Agent: If yes, when and what did the pharmacy advise?)   Pt needed to call office about change wanted and always calls in these meds   Preferred Pharmacy (with phone number or street name): [facility] [address] ([address]), [place] - [address]  [address]) [place]  Phone:  [phone]  Fax:  [phone]  Has the patient been seen for an appointment in the last year OR does the patient have an upcoming appointment? Yes.    Agent: Please be advised that RX refills may take up to 3 business days. We ask that you follow-up with your pharmacy.

## 2021-01-03 NOTE — Telephone Encounter (Signed)
Please review for refill. Medication refills not delegated per refill protocol. Patient requesting an increase in dosage of oxycodone.

## 2021-01-04 ENCOUNTER — Telehealth (INDEPENDENT_AMBULATORY_CARE_PROVIDER_SITE_OTHER): Payer: Self-pay | Admitting: Primary Care

## 2021-01-04 NOTE — Telephone Encounter (Signed)
Requested medication (s) are due for refill today: yes  Requested medication (s) are on the active medication list: yes  Last refill:  12/03/20 #44  Future visit scheduled: yes  Notes to clinic:  med not delegated to NT for RF   Requested Prescriptions  Pending Prescriptions Disp Refills   traMADol (ULTRAM) 50 MG tablet 40 tablet 0    Sig: Take 1-2 tablets (50-100 mg total) by mouth every 6 (six) hours as needed for moderate pain.     Not Delegated - Analgesics:  Opioid Agonists Failed - 01/03/2021  4:20 PM      Failed - This refill cannot be delegated      Failed - Urine Drug Screen completed in last 360 days      Passed - Valid encounter within last 6 months    Recent Outpatient Visits           1 month ago Pre-op examination   [facility] Grayce Sessions, NP   4 months ago Essential hypertension   [facility] Grayce Sessions, NP   8 months ago Essential hypertension   [facility] Grayce Sessions, NP   9 months ago Essential hypertension   [facility] Grayce Sessions, NP   1 year ago Fatigue, unspecified type   [facility]       Future Appointments             In 2 weeks Randa Evens, Kinnie Scales, NP [facility]

## 2021-01-04 NOTE — Telephone Encounter (Signed)
Pt requesting increase of pain medication: Medication Refill - Medication: oxyCODONE (OXY IR/ROXICODONE) 5 MG immediate release tablet  Pt asked if the strength on this can be increased / 5mg  does nothing for the pain from his knee replacement /please advise    Last RF 12/03/20 #42 Medication not delegated to NT to RF

## 2021-01-06 NOTE — Telephone Encounter (Signed)
Left message informing patient that PCP does not prescribe this medication. Please contact last prescribing provider. Return call to [facility] at [phone].

## 2021-01-13 ENCOUNTER — Encounter: Payer: Self-pay | Admitting: Gastroenterology

## 2021-01-23 ENCOUNTER — Ambulatory Visit (INDEPENDENT_AMBULATORY_CARE_PROVIDER_SITE_OTHER): Payer: 59 | Admitting: Primary Care

## 2021-01-23 ENCOUNTER — Other Ambulatory Visit: Payer: Self-pay

## 2021-01-23 ENCOUNTER — Encounter (INDEPENDENT_AMBULATORY_CARE_PROVIDER_SITE_OTHER): Payer: Self-pay | Admitting: Primary Care

## 2021-01-23 VITALS — BP 145/79 | HR 93 | Temp 97.5°F | Ht 71.0 in | Wt 229.8 lb

## 2021-01-23 DIAGNOSIS — Z131 Encounter for screening for diabetes mellitus: Secondary | ICD-10-CM

## 2021-01-23 DIAGNOSIS — E119 Type 2 diabetes mellitus without complications: Secondary | ICD-10-CM

## 2021-01-23 DIAGNOSIS — I10 Essential (primary) hypertension: Secondary | ICD-10-CM | POA: Diagnosis not present

## 2021-01-23 DIAGNOSIS — Z1211 Encounter for screening for malignant neoplasm of colon: Secondary | ICD-10-CM

## 2021-01-23 LAB — POCT GLYCOSYLATED HEMOGLOBIN (HGB A1C): Hemoglobin A1C: 10.8 % — AB (ref 4.0–5.6)

## 2021-01-23 MED ORDER — METFORMIN HCL ER 500 MG PO TB24: ORAL_TABLET | ORAL | 1 refills | Status: DC

## 2021-01-23 MED ORDER — LISINOPRIL 20 MG PO TABS: 20.0000 mg | ORAL_TABLET | Freq: Every day | ORAL | 1 refills | Status: DC

## 2021-01-23 MED ORDER — AMLODIPINE BESYLATE 10 MG PO TABS: 10.0000 mg | ORAL_TABLET | Freq: Every day | ORAL | 1 refills | Status: DC

## 2021-01-23 NOTE — Progress Notes (Signed)
[facility]   James Robinson is a 60 y.o. male presents for hypertension evaluation, Denies shortness of breath, headaches, chest pain or lower extremity edema, sudden onset, vision changes, unilateral weakness, dizziness, paresthesias.Patient complained of increase thirst and using the bathroom frequently and wanted a referral to urology. 3/22 referral sent for BPH and not documentation-of patient being contacted nor does patient know if he was contacted. Also, due to arthritis in his knee he had TKR-for months he has been on prednisone. Today A1C is 10.8 steroid induced diabetes.   Patient denies adherence with medications.  Dietary habits include: none  Exercise habits include:no- in PT for RTK replacement  Family / Social history: None- to his knowledge   Past Medical History:  Diagnosis Date   Anxiety    Arthritis    Depression    Hypertension Dx 2008   on BP medicine for a brief period    Migraine Dx 1985   Past Surgical History:  Procedure Laterality Date   NO PAST SURGERIES     TOTAL KNEE ARTHROPLASTY Right 12/02/2020   Procedure: TOTAL KNEE ARTHROPLASTY;  Surgeon: Ollen Gross, MD;  Location: [facility];  Service: Orthopedics;  Laterality: Right;   No Known Allergies Current Outpatient Medications on File Prior to Visit  Medication Sig Dispense Refill   amLODipine (NORVASC) 10 MG tablet Take 1 tablet (10 mg total) by mouth daily. 90 tablet 0   gabapentin (NEURONTIN) 300 MG capsule Take a 300 mg capsule three times a day for two weeks following surgery.Then take a 300 mg capsule two times a day for two weeks. Then take a 300 mg capsule once a day for two weeks. Then discontinue. 84 capsule 0   methocarbamol (ROBAXIN) 500 MG tablet Take 1 tablet (500 mg total) by mouth every 6 (six) hours as needed for muscle spasms. 40 tablet 0   oxyCODONE (OXY IR/ROXICODONE) 5 MG immediate release tablet Take 1-2 tablets (5-10 mg total) by mouth every 6 (six) hours as needed  for severe pain. Not to exceed 6 tablets a day. 42 tablet 0   PREDNISONE PO Take 1 tablet by mouth daily as needed (lower cholesterol).     traMADol (ULTRAM) 50 MG tablet Take 1-2 tablets (50-100 mg total) by mouth every 6 (six) hours as needed for moderate pain. 40 tablet 0   No current facility-administered medications on file prior to visit.   Social History   Socioeconomic History   Marital status: Single    Spouse name: Not on file   Number of children: 0   Years of education: 12    Highest education level: Not on file  Occupational History   Occupation: Waiter   Tobacco Use   Smoking status: Former    Packs/day: 0.50    Years: 27.00    Pack years: 13.50    Types: Cigarettes    Quit date: 07/23/2015    Years since quitting: 5.5   Smokeless tobacco: Never  Vaping Use   Vaping Use: Never used  Substance and Sexual Activity   Alcohol use: Yes    Comment: social   Drug use: Yes    Types: Marijuana    Comment: last used 3 weeks ago   Sexual activity: Never    Birth control/protection: Condom  Other Topics Concern   Not on file  Social History Narrative   Lives alone.   Sister lives in town.   Father lives in [place].       Social  Determinants of Health   Financial Resource Strain: Not on file  Food Insecurity: Not on file  Transportation Needs: Not on file  Physical Activity: Not on file  Stress: Not on file  Social Connections: Not on file  Intimate Partner Violence: Not on file   Family History  Problem Relation Age of Onset   Glaucoma Mother    Diabetes Neg Hx    Heart disease Neg Hx    Cancer Neg Hx      OBJECTIVE:  Vitals:   01/23/21 1337  BP: (!) 145/79  Pulse: 93  Temp: (!) 97.5 F (36.4 C)  TempSrc: Temporal  SpO2: 97%  Weight: 229 lb 12.8 oz (104.2 kg)  Height: 5\' 11"  (1.803 m)   Physical exam: General: Vital signs reviewed.  Patient is well-developed and well-nourished, obese male in no acute distress and cooperative with  exam. Head: Normocephalic and atraumatic. Eyes: EOMI, conjunctivae normal, no scleral icterus. Neck: Supple, trachea midline, normal ROM, no JVD, masses, thyromegaly, or carotid bruit present. Cardiovascular: RRR, S1 normal, S2 normal, no murmurs, gallops, or rubs. Pulmonary/Chest: Clear to auscultation bilaterally, no wheezes, rales, or rhonchi. Abdominal: Soft, non-tender, non-distended, BS +, no masses, organomegaly, or guarding present. Musculoskeletal: No joint deformities, erythema, or stiffness, ROM full and nontender. Extremities: No lower extremity edema bilaterally,  pulses symmetric and intact bilaterally. No cyanosis or clubbing. Neurological: A&O x3, Strength is normal Skin: Warm, dry and intact. No rashes or erythema. Psychiatric: Normal mood and affect. speech and behavior is normal. Cognition and memory are normal.     ROS Comprehensive ROS noted in HPI pertinent positive and negatives noted   Last 3 Office BP readings: BP Readings from Last 3 Encounters:  01/23/21 (!) 145/79  12/03/20 128/82  11/22/20 (!) 159/73    BMET    Component Value Date/Time   NA 134 (L) 12/03/2020 0323   NA 137 11/18/2020 0924   K 3.9 12/03/2020 0323   CL 103 12/03/2020 0323   CO2 22 12/03/2020 0323   GLUCOSE 359 (H) 12/03/2020 0323   BUN 14 12/03/2020 0323   BUN 14 11/18/2020 0924   CREATININE 0.89 12/03/2020 0323   CREATININE 0.79 01/01/2014 1027   CALCIUM 8.3 (L) 12/03/2020 0323   GFRNONAA >60 12/03/2020 0323   GFRNONAA >89 01/01/2014 1027   GFRAA 109 10/10/2019 1442   GFRAA >89 01/01/2014 1027    Renal function: CrCl cannot be calculated (Patient's most recent lab result is older than the maximum 21 days allowed.).  Clinical ASCVD: Yes  The 10-year ASCVD risk score (Arnett DK, et al., 2019) is: 15.8%   Values used to calculate the score:     Age: 51 years     Sex: Male     Is Non-Hispanic African American: Yes     Diabetic: No     Tobacco smoker: No     Systolic  Blood Pressure: 145 mmHg     Is BP treated: Yes     HDL Cholesterol: 40 mg/dL     Total Cholesterol: 148 mg/dL  ASCVD risk factors include- 46   ASSESSMENT & PLAN:  James Robinson was seen today for diabetes.  Diagnoses and all orders for this visit:  Essential hypertension -Counseled on lifestyle modifications for blood pressure control including reduced dietary sodium, increased exercise, weight reduction and adequate sleep. Also, educated patient about the risk for cardiovascular events, stroke and heart attack. Also counseled patient about the importance of medication adherence. If you participate in smoking, it  is important to stop using tobacco as this will increase the risks associated with uncontrolled blood pressure.   -Hypertension longstanding diagnosed currently not compliant with Bp medication . Patient is not adherent with current medications.   Goal BP:  For patients younger than 60: Goal BP < 130/80. For patients 60 and older: Goal BP < 140/90. For patients with diabetes: Goal BP < 130/80. Your most recent BP: 145/79  Minimize salt intake. Minimize alcohol intake amlodipine (NORVASC) 10 MG tablet; Take 1 tablet (10 mg total) by mouth daily. Added lisinopril 20mg  daily  Screening for diabetes mellitus -     HgB A1c 10.8 -     metFORMIN (GLUCOPHAGE XR) 500 MG 24 hr tablet; Take 1 tablet twice a day for 1 week. If no GI symptoms week 2 take 2 tablets twice a day  Colon cancer screening -     Cologuard  Type 2 diabetes mellitus without complication, without long-term current use of insulin (HCC) New dx medication induce diabetes  Monitor foods that are high in carbohydrates are the following rice, potatoes, breads, sugars, and pastas.  Reduction in the intake (eating) will assist in lowering your blood sugars.  -     metFORMIN (GLUCOPHAGE XR) 500 MG 24 hr tablet; Take 1 tablet twice a day for 1 week. If no GI symptoms week 2 take 2 tablets twice a day  Other orders -      lisinopril (ZESTRIL) 20 MG tablet; Take 1 tablet (20 mg total) by mouth daily.     This note has been created with . Any transcriptional errors are unintentional.   Education officer, environmental, NP 01/23/2021, 1:42 PM

## 2021-01-23 NOTE — Patient Instructions (Addendum)
James Robinson, MDType 2 Diabetes Mellitus, Diagnosis, Adult Type 2 diabetes (type 2 diabetes mellitus) is a long-term (chronic) disease. It may happen when there is one or both of these problems: The pancreas does not make enough insulin. The body does not react in a normal way to insulin that it makes. Insulin lets sugars go into cells in your body. If you have type 2 diabetes, sugars cannot get into your cells. Sugars build up in the blood. This causes high blood sugar. What are the causes? The exact cause of this condition is not known. What increases the risk? Having type 2 diabetes in your family. Being overweight or very overweight. Not being active. Your body not reacting in a normal way to the insulin it makes. Having higher than normal blood sugar over time. Having a type of diabetes when you were pregnant. Having a condition that causes small fluid-filled sacs on your ovaries. What are the signs or symptoms? At first, you may have no symptoms. You will get symptoms slowly. They may include: More thirst than normal. More hunger than normal. Needing to pee more than normal. Losing weight without trying. Feeling tired. Feeling weak. Seeing things blurry. Dark patches on your skin. How is this treated? This condition may be treated by a diabetes expert. You may need to: Follow an eating plan made by a food expert (dietitian). Get regular exercise. Find ways to deal with stress. Check blood sugar as often as told. Take medicines. Your doctor will set treatment goals for you. Your blood sugar should be at these levels: Before meals: 80-130 mg/dL (4.7-8.2 mmol/L). After meals: below 180 mg/dL (10 mmol/L). Over the last 2-3 months: less than 7%. Follow these instructions at home: Medicines Take your diabetes medicines or insulin every day. Take medicines as told to help you prevent other problems caused by this condition. You may need: Aspirin. Medicine to lower  cholesterol. Medicine to control blood pressure. Questions to ask your doctor Should I meet with a diabetes educator? What medicines do I need, and when should I take them? What will I need to treat my condition at home? When should I check my blood sugar? Where can I find a support group? Who can I call if I have questions? When is my next doctor visit? General instructions Take over-the-counter and prescription medicines only as told by your doctor. Keep all follow-up visits. Where to find more information For help and guidance and more information about diabetes, please go to: American Diabetes Association (ADA): www.diabetes.org American Association of Diabetes Care and Education Specialists (ADCES): www.diabeteseducator.org International Diabetes Federation (IDF): DCOnly.dk Contact a doctor if: Your blood sugar is at or above 240 mg/dL (95.6 mmol/L) for 2 days in a row. You have been sick for 2 days or more, and you are not getting better. You have had a fever for 2 days or more, and you are not getting better. You have any of these problems for more than 6 hours: You cannot eat or drink. You feel like you may vomit. You vomit. You have watery poop (diarrhea). Get help right away if: Your blood sugar is lower than 54 mg/dL (3 mmol/L). You feel mixed up (confused). You have trouble thinking clearly. You have trouble breathing. You have medium or large ketone levels in your pee. These symptoms may be an emergency. Get help right away. Call your local emergency services (911 in the [place] Do not wait to see if the symptoms will go away. Do not  drive yourself to the hospital. Summary Type 2 diabetes is a long-term disease. Your pancreas may not make enough insulin, or your body may not react in a normal way to insulin that it makes. This condition is treated with an eating plan, lifestyle changes, and medicines. Your doctor will set treatment goals for you. These will help  you keep your blood sugar in a healthy range. Keep all follow-up visits. This information is not intended to replace advice given to you by your health care provider. Make sure you discuss any questions you have with your health care provider. Document Revised: 04/22/2020 Document Reviewed: 04/22/2020 Elsevier Patient Education  2022 [employer].

## 2021-01-27 ENCOUNTER — Other Ambulatory Visit: Payer: Self-pay

## 2021-01-27 ENCOUNTER — Telehealth (INDEPENDENT_AMBULATORY_CARE_PROVIDER_SITE_OTHER): Payer: Self-pay

## 2021-01-27 ENCOUNTER — Other Ambulatory Visit (INDEPENDENT_AMBULATORY_CARE_PROVIDER_SITE_OTHER): Payer: 59

## 2021-01-27 DIAGNOSIS — E119 Type 2 diabetes mellitus without complications: Secondary | ICD-10-CM

## 2021-01-27 NOTE — Telephone Encounter (Signed)
Copied from CRM [id number]. Topic: General - Other >> Jan 27, 2021  2:13 PM James Robinson A wrote: Reason for CRM: The patient has recently been diagnosed with Type 2 diabetes   The patient has called to request a prescription for a glucose monitoring device and testing supplies   Please contact further when possible regarding this request

## 2021-01-28 LAB — CMP14+EGFR
ALT: 13 IU/L (ref 0–44)
AST: 11 IU/L (ref 0–40)
Albumin/Globulin Ratio: 1.2 (ref 1.2–2.2)
Albumin: 3.9 g/dL (ref 3.8–4.9)
Alkaline Phosphatase: 137 IU/L — ABNORMAL HIGH (ref 44–121)
BUN/Creatinine Ratio: 14 (ref 9–20)
BUN: 13 mg/dL (ref 6–24)
Bilirubin Total: 0.6 mg/dL (ref 0.0–1.2)
CO2: 22 mmol/L (ref 20–29)
Calcium: 9.2 mg/dL (ref 8.7–10.2)
Chloride: 98 mmol/L (ref 96–106)
Creatinine, Ser: 0.95 mg/dL (ref 0.76–1.27)
Globulin, Total: 3.3 g/dL (ref 1.5–4.5)
Glucose: 340 mg/dL — ABNORMAL HIGH (ref 70–99)
Potassium: 4.1 mmol/L (ref 3.5–5.2)
Sodium: 134 mmol/L (ref 134–144)
Total Protein: 7.2 g/dL (ref 6.0–8.5)
eGFR: 92 mL/min/{1.73_m2} (ref >59)

## 2021-01-28 LAB — LIPID PANEL
Chol/HDL Ratio: 4.1 ratio (ref 0.0–5.0)
Cholesterol, Total: 154 mg/dL (ref 100–199)
HDL: 38 mg/dL — ABNORMAL LOW (ref >39)
LDL Chol Calc (NIH): 94 mg/dL (ref 0–99)
Triglycerides: 123 mg/dL (ref 0–149)
VLDL Cholesterol Cal: 22 mg/dL (ref 5–40)

## 2021-01-28 LAB — CBC WITH DIFFERENTIAL/PLATELET
Basophils Absolute: 0 10*3/uL (ref 0.0–0.2)
Basos: 0 %
EOS (ABSOLUTE): 0.2 10*3/uL (ref 0.0–0.4)
Eos: 3 %
Hematocrit: 36.7 % — ABNORMAL LOW (ref 37.5–51.0)
Hemoglobin: 12.4 g/dL — ABNORMAL LOW (ref 13.0–17.7)
Immature Grans (Abs): 0 10*3/uL (ref 0.0–0.1)
Immature Granulocytes: 0 %
Lymphocytes Absolute: 2.3 10*3/uL (ref 0.7–3.1)
Lymphs: 30 %
MCH: 30.3 pg (ref 26.6–33.0)
MCHC: 33.8 g/dL (ref 31.5–35.7)
MCV: 90 fL (ref 79–97)
Monocytes Absolute: 0.9 10*3/uL (ref 0.1–0.9)
Monocytes: 12 %
Neutrophils Absolute: 4.1 10*3/uL (ref 1.4–7.0)
Neutrophils: 55 %
Platelets: 250 10*3/uL (ref 150–450)
RBC: 4.09 x10E6/uL — ABNORMAL LOW (ref 4.14–5.80)
RDW: 13.7 % (ref 11.6–15.4)
WBC: 7.5 10*3/uL (ref 3.4–10.8)

## 2021-01-28 NOTE — Telephone Encounter (Signed)
Pt following up on request fr a new glucometer. Will need lancets and test strips as well.   [id number] - [place] ([address]), [place] - [address]

## 2021-01-29 ENCOUNTER — Telehealth (INDEPENDENT_AMBULATORY_CARE_PROVIDER_SITE_OTHER): Payer: Self-pay

## 2021-01-29 ENCOUNTER — Other Ambulatory Visit (INDEPENDENT_AMBULATORY_CARE_PROVIDER_SITE_OTHER): Payer: Self-pay | Admitting: Primary Care

## 2021-01-29 MED ORDER — BLOOD GLUCOSE METER KIT: PACK | 0 refills | Status: AC

## 2021-01-29 NOTE — Telephone Encounter (Signed)
Patient is aware of lab results. James Robinson, CMA

## 2021-01-29 NOTE — Telephone Encounter (Signed)
-----   Message from Grayce Sessions, NP sent at 01/28/2021  3:37 PM EST ----- Labs are essentially normal.  Make sure you are drinking at least 48 oz of water per day. Work on eating a low fat, heart healthy diet and participate in regular aerobic exercise program to control as well. Exercise at least  30 minutes per day-5 days per week. Avoid red meat. No fried foods. No junk foods, sodas, sugary foods or drinks, unhealthy snacking, alcohol or smoking.

## 2021-02-04 ENCOUNTER — Ambulatory Visit (INDEPENDENT_AMBULATORY_CARE_PROVIDER_SITE_OTHER): Payer: Self-pay

## 2021-02-04 ENCOUNTER — Other Ambulatory Visit (INDEPENDENT_AMBULATORY_CARE_PROVIDER_SITE_OTHER): Payer: Self-pay | Admitting: Primary Care

## 2021-02-04 DIAGNOSIS — E119 Type 2 diabetes mellitus without complications: Secondary | ICD-10-CM

## 2021-02-04 NOTE — Telephone Encounter (Signed)
Please place referral

## 2021-02-04 NOTE — Telephone Encounter (Signed)
°  Chief Complaint: High glucose, blurred vision Symptoms: Blurred vision. Can see with his glasses Frequency: Started 1 week ago Pertinent Negatives: Patient denies  Disposition: [] ED /[] Urgent Care (no appt availability in office) / [] Appointment(In office/virtual)/ []  [facility]/ [] Home Care/ [] Refused Recommended Disposition  Additional Notes: Asking for a referral to an eye doctor in his network for diabetic eye check up. States he has been compliant with diet and medications. Please advise pt.    Answer Assessment - Initial Assessment Questions 1. BLOOD GLUCOSE: "What is your blood glucose level?"      244 2. ONSET: "When did you check the blood glucose?"     This morning 3. USUAL RANGE: "What is your glucose level usually?" (e.g., usual fasting morning value, usual evening value)     Unsure 4. KETONES: "Do you check for ketones (urine or blood test strips)?" If yes, ask: "What does the test show now?"      No 5. TYPE 1 or 2:  "Do you know what type of diabetes you have?"  (e.g., Type 1, Type 2, Gestational; doesn't know)      Type 2 6. INSULIN: "Do you take insulin?" "What type of insulin(s) do you use? What is the mode of delivery? (syringe, pen; injection or pump)?"      No 7. DIABETES PILLS: "Do you take any pills for your diabetes?" If yes, ask: "Have you missed taking any pills recently?"     Metformin 8. OTHER SYMPTOMS: "Do you have any symptoms?" (e.g., fever, frequent urination, difficulty breathing, dizziness, weakness, vomiting)     Blurred vision 9. PREGNANCY: "Is there any chance you are pregnant?" "When was your last menstrual period?"     N/a  Protocols used: Diabetes - High Blood Sugar-A-AH

## 2021-02-19 ENCOUNTER — Ambulatory Visit: Payer: 59 | Admitting: Pharmacist

## 2021-03-05 ENCOUNTER — Ambulatory Visit: Payer: 59 | Admitting: Pharmacist

## 2021-03-10 ENCOUNTER — Ambulatory Visit (AMBULATORY_SURGERY_CENTER): Payer: Self-pay | Admitting: *Deleted

## 2021-03-10 ENCOUNTER — Other Ambulatory Visit: Payer: Self-pay

## 2021-03-10 VITALS — Ht 71.0 in | Wt 235.0 lb

## 2021-03-10 DIAGNOSIS — Z1211 Encounter for screening for malignant neoplasm of colon: Secondary | ICD-10-CM

## 2021-03-10 MED ORDER — PEG 3350-KCL-NA BICARB-NACL 420 G PO SOLR
4000.0000 mL | Freq: Once | ORAL | 0 refills | Status: AC
Start: 2021-03-10 — End: 2021-03-10

## 2021-03-10 NOTE — Progress Notes (Signed)

## 2021-03-19 ENCOUNTER — Ambulatory Visit (INDEPENDENT_AMBULATORY_CARE_PROVIDER_SITE_OTHER): Payer: Self-pay

## 2021-03-19 NOTE — Telephone Encounter (Signed)
Please advise 

## 2021-03-19 NOTE — Telephone Encounter (Signed)
°  Chief Complaint: leg pain Symptoms: L leg pain in thigh/hip area, buckles and limps, 10/10 pain Frequency: ongoing for several months but getting worse Pertinent Negatives: NA Disposition: [] ED /[x] Urgent Care (no appt availability in office) / [] Appointment(In office/virtual)/ []  [facility]/ [] Home Care/ [] Refused Recommended Disposition /[] [facility]/ []  Follow-up with PCP Additional Notes: Pt scheduled for OV on 04/01/21 but was hoping to be seen before then but states takes naproxen and helps at times but if gets intense to where he cant bear pt he will go to UC and call to cancel appt if needed    Summary: left thigh pain   Pt has left thigh pain / pt had a right knee replacement and thinks it maybe due to putting pressure on the left hip since having right knee replaced / please advise / pt stated it has buckled several times and the pain is worsening/ next available appt is 2.21.23/ please advise      Reason for Disposition  [1] SEVERE pain (e.g., excruciating, unable to do any normal activities) AND [2] not improved after 2 hours of pain medicine  Answer Assessment - Initial Assessment Questions 1. ONSET: "When did the pain start?"      Ongoing for months 2. LOCATION: "Where is the pain located?"      L leg thigh area 3. PAIN: "How bad is the pain?"    (Scale 1-10; or mild, moderate, severe)   -  MILD (1-3): doesn't interfere with normal activities    -  MODERATE (4-7): interferes with normal activities (e.g., work or school) or awakens from sleep, limping    -  SEVERE (8-10): excruciating pain, unable to do any normal activities, unable to walk     10 4. WORK OR EXERCISE: "Has there been any recent work or exercise that involved this part of the body?"      NO 5. CAUSE: "What do you think is causing the leg pain?"     Compensating from R knee replacement 6. OTHER SYMPTOMS: "Do you have any other symptoms?" (e.g., chest pain, back pain, breathing  difficulty, swelling, rash, fever, numbness, weakness)     Limping and buckling at times  Protocols used: Leg Pain-A-AH

## 2021-03-21 ENCOUNTER — Encounter: Payer: Self-pay | Admitting: Gastroenterology

## 2021-03-21 ENCOUNTER — Telehealth: Payer: Self-pay

## 2021-03-21 DIAGNOSIS — Z1211 Encounter for screening for malignant neoplasm of colon: Secondary | ICD-10-CM

## 2021-03-21 MED ORDER — PEG 3350-KCL-NA BICARB-NACL 420 G PO SOLR: 4000.0000 mL | Freq: Once | ORAL | 0 refills | Status: AC

## 2021-03-21 NOTE — Telephone Encounter (Signed)
Pt scheduled to see Dr. Myrtie Neither on Monday, Pharmacy stated that they put the prep "back on the shelf" and is requesting to have new prep prescription for Golytely sent over to pharmacy at [facility] on [address].

## 2021-03-21 NOTE — Telephone Encounter (Signed)
Prescription for Golytely sent to [facility] on [address]

## 2021-03-24 ENCOUNTER — Encounter: Payer: Self-pay | Admitting: Gastroenterology

## 2021-03-24 ENCOUNTER — Ambulatory Visit (AMBULATORY_SURGERY_CENTER): Payer: Managed Care, Other (non HMO) | Admitting: Gastroenterology

## 2021-03-24 ENCOUNTER — Other Ambulatory Visit: Payer: Self-pay

## 2021-03-24 VITALS — BP 123/73 | HR 73 | Temp 98.0°F | Resp 12 | Ht 71.0 in | Wt 235.0 lb

## 2021-03-24 DIAGNOSIS — Z1211 Encounter for screening for malignant neoplasm of colon: Secondary | ICD-10-CM | POA: Diagnosis not present

## 2021-03-24 MED ORDER — SODIUM CHLORIDE 0.9 % IV SOLN: 500.0000 mL | INTRAVENOUS | Status: DC

## 2021-03-24 NOTE — Progress Notes (Signed)
Pt's states no medical or surgical changes since previsit or office visit. 

## 2021-03-24 NOTE — Progress Notes (Signed)
Pt non-responsive, VVS, Report to RN  °

## 2021-03-24 NOTE — Progress Notes (Signed)
History and Physical:  This patient presents for endoscopic testing for: Encounter Diagnosis  Name Primary?   Special screening for malignant neoplasms, colon Yes    Patient denies chronic abdominal pain, rectal bleeding, constipation or diarrhea. First screening exam   ROS: Patient denies chest pain or shortness of breath   Past Medical History: Past Medical History:  Diagnosis Date   Anxiety    Arthritis    Depression    Diabetes mellitus without complication ([place])    Hyperlipidemia    Hypertension Dx 2008   on BP medicine for a brief period    Migraine Dx 1985     Past Surgical History: Past Surgical History:  Procedure Laterality Date   NO PAST SURGERIES     TOTAL KNEE ARTHROPLASTY Right 12/02/2020   Procedure: TOTAL KNEE ARTHROPLASTY;  Surgeon: Gaynelle Arabian, MD;  Location: [facility];  Service: Orthopedics;  Laterality: Right;    Allergies: No Known Allergies  Outpatient Meds: Current Outpatient Medications  Medication Sig Dispense Refill   amLODipine (NORVASC) 10 MG tablet Take 1 tablet (10 mg total) by mouth daily. 90 tablet 1   blood glucose meter kit and supplies Dispense based on patient and insurance preference. Use up to four times daily as directed. (FOR ICD-10 E10.9, E11.9). 1 each 0   gabapentin (NEURONTIN) 300 MG capsule Take a 300 mg capsule three times a day for two weeks following surgery.Then take a 300 mg capsule two times a day for two weeks. Then take a 300 mg capsule once a day for two weeks. Then discontinue. 84 capsule 0   metFORMIN (GLUCOPHAGE XR) 500 MG 24 hr tablet Take 1 tablet twice a day for 1 week. If no GI symptoms week 2 take 2 tablets twice a day 120 tablet 1   lisinopril (ZESTRIL) 20 MG tablet Take 1 tablet (20 mg total) by mouth daily. 90 tablet 1   methocarbamol (ROBAXIN) 500 MG tablet Take 1 tablet (500 mg total) by mouth every 6 (six) hours as needed for muscle spasms. (Patient not taking: Reported on 03/10/2021) 40 tablet 0    oxyCODONE (OXY IR/ROXICODONE) 5 MG immediate release tablet Take 1-2 tablets (5-10 mg total) by mouth every 6 (six) hours as needed for severe pain. Not to exceed 6 tablets a day. (Patient not taking: Reported on 03/10/2021) 42 tablet 0   PREDNISONE PO Take 1 tablet by mouth daily as needed (lower cholesterol).     Current Facility-Administered Medications  Medication Dose Route Frequency Provider Last Rate Last Admin   0.9 %  sodium chloride infusion  500 mL Intravenous Continuous Danis, Estill Cotta III, MD          ___________________________________________________________________ Objective   Exam:  BP (!) 154/89    Pulse 79    Temp 98 F (36.7 C) (Temporal)    Ht '5\' 11"'  (1.803 m)    Wt 235 lb (106.6 kg)    SpO2 99%    BMI 32.78 kg/m   CV: RRR without murmur, S1/S2 Resp: clear to auscultation bilaterally, normal RR and effort noted GI: soft, no tenderness, with active bowel sounds.   Assessment: Encounter Diagnosis  Name Primary?   Special screening for malignant neoplasms, colon Yes     Plan: Colonoscopy  The benefits and risks of the planned procedure were described in detail with the patient or (when appropriate) their health care proxy.  Risks were outlined as including, but not limited to, bleeding, infection, perforation, adverse medication reaction leading to  cardiac or pulmonary decompensation, pancreatitis (if ERCP).  The limitation of incomplete mucosal visualization was also discussed.  No guarantees or warranties were given.    The patient is appropriate for an endoscopic procedure in the ambulatory setting.   - Wilfrid Lund, MD

## 2021-03-24 NOTE — Op Note (Signed)
[facility] Patient Name: James Robinson Procedure Date: 03/24/2021 8:33 AM MRN: [id number] Endoscopist: Mallie Mussel L. Loletha Carrow , MD Age: 61 Referring MD:  Date of Birth: [birth year] Gender: Male Account #: [id number] Procedure:                Colonoscopy Indications:              Screening for colorectal malignant neoplasm, This                            is the patient's first colonoscopy Medicines:                Monitored Anesthesia Care Procedure:                Pre-Anesthesia Assessment:                           - Prior to the procedure, a History and Physical                            was performed, and patient medications and                            allergies were reviewed. The patient's tolerance of                            previous anesthesia was also reviewed. The risks                            and benefits of the procedure and the sedation                            options and risks were discussed with the patient.                            All questions were answered, and informed consent                            was obtained. Prior Anticoagulants: The patient has                            taken no previous anticoagulant or antiplatelet                            agents. ASA Grade Assessment: II - A patient with                            mild systemic disease. After reviewing the risks                            and benefits, the patient was deemed in                            satisfactory condition to undergo the procedure.  After obtaining informed consent, the colonoscope                            was passed under direct vision. Throughout the                            procedure, the patient's blood pressure, pulse, and                            oxygen saturations were monitored continuously. The                            CF [id number] was introduced through the anus                            and advanced to the the  cecum, identified by                            appendiceal orifice and ileocecal valve. The                            colonoscopy was performed without difficulty. The                            patient tolerated the procedure well. The quality                            of the bowel preparation was good after lavage. The                            ileocecal valve, appendiceal orifice, and rectum                            were photographed. The bowel preparation used was                            GoLYTELY. Scope In: 8:39:12 AM Scope Out: 9:00:42 AM Scope Withdrawal Time: 0 hours 16 minutes 44 seconds  Total Procedure Duration: 0 hours 21 minutes 30 seconds  Findings:                 The perianal and digital rectal examinations were                            normal.                           Repeat examination of right colon under NBI                            performed.                           Multiple diverticula were found in the left colon.  There was a large lipoma, in the transverse colon.                           Internal hemorrhoids were found.                           The exam was otherwise without abnormality on                            direct and retroflexion views. Complications:            No immediate complications. Estimated Blood Loss:     Estimated blood loss: none. Impression:               - Diverticulosis in the left colon.                           - Large lipoma in the transverse colon.                           - Internal hemorrhoids.                           - The examination was otherwise normal on direct                            and retroflexion views.                           - No specimens collected. Recommendation:           - Patient has a contact number available for                            emergencies. The signs and symptoms of potential                            delayed complications were discussed with the                             patient. Return to normal activities tomorrow.                            Written discharge instructions were provided to the                            patient.                           - Resume previous diet.                           - Continue present medications.                           - Repeat colonoscopy in 10 years for screening  purposes. Tayte Mcwherter L. Loletha Carrow, MD 03/24/2021 9:04:24 AM This report has been signed electronically.

## 2021-03-24 NOTE — Patient Instructions (Signed)
Handout given for diverticulosis.  YOU HAD AN ENDOSCOPIC PROCEDURE TODAY AT THE [facility]:   Refer to the procedure report that was given to you for any specific questions about what was found during the examination.  If the procedure report does not answer your questions, please call your gastroenterologist to clarify.  If you requested that your care partner not be given the details of your procedure findings, then the procedure report has been included in a sealed envelope for you to review at your convenience later.  YOU SHOULD EXPECT: Some feelings of bloating in the abdomen. Passage of more gas than usual.  Walking can help get rid of the air that was put into your GI tract during the procedure and reduce the bloating. If you had a lower endoscopy (such as a colonoscopy or flexible sigmoidoscopy) you may notice spotting of blood in your stool or on the toilet paper. If you underwent a bowel prep for your procedure, you may not have a normal bowel movement for a few days.  Please Note:  You might notice some irritation and congestion in your nose or some drainage.  This is from the oxygen used during your procedure.  There is no need for concern and it should clear up in a day or so.  SYMPTOMS TO REPORT IMMEDIATELY:   Following lower endoscopy (colonoscopy or flexible sigmoidoscopy):  Excessive amounts of blood in the stool  Significant tenderness or worsening of abdominal pains  Swelling of the abdomen that is new, acute  Fever of 100F or higher  For urgent or emergent issues, a gastroenterologist can be reached at any hour by calling [phone]. Do not use MyChart messaging for urgent concerns.    DIET:  We do recommend a small meal at first, but then you may proceed to your regular diet.  Drink plenty of fluids but you should avoid alcoholic beverages for 24 hours.  ACTIVITY:  You should plan to take it easy for the rest of today and you should NOT DRIVE or use  heavy machinery until tomorrow (because of the sedation medicines used during the test).    FOLLOW UP: Our staff will call the number listed on your records 48-72 hours following your procedure to check on you and address any questions or concerns that you may have regarding the information given to you following your procedure. If we do not reach you, we will leave a message.  We will attempt to reach you two times.  During this call, we will ask if you have developed any symptoms of COVID 19. If you develop any symptoms (ie: fever, flu-like symptoms, shortness of breath, cough etc.) before then, please call [phone].  If you test positive for Covid 19 in the 2 weeks post procedure, please call and report this information to us.    If any biopsies were taken you will be contacted by phone or by letter within the next 1-3 weeks.  Please call us at [phone] if you have not heard about the biopsies in 3 weeks.    SIGNATURES/CONFIDENTIALITY: You and/or your care partner have signed paperwork which will be entered into your electronic medical record.  These signatures attest to the fact that that the information above on your After Visit Summary has been reviewed and is understood.  Full responsibility of the confidentiality of this discharge information lies with you and/or your care-partner. 

## 2021-03-25 ENCOUNTER — Other Ambulatory Visit (INDEPENDENT_AMBULATORY_CARE_PROVIDER_SITE_OTHER): Payer: Self-pay | Admitting: Primary Care

## 2021-03-25 DIAGNOSIS — M25552 Pain in left hip: Secondary | ICD-10-CM

## 2021-03-25 NOTE — Telephone Encounter (Signed)
Called patient right TKR during great now left hip and knee pain . Told patient would refer back to Dr. Despina Hick to evaluate left side.

## 2021-03-26 ENCOUNTER — Telehealth: Payer: Self-pay

## 2021-03-26 NOTE — Telephone Encounter (Signed)
°  Follow up Call-  Call back number 03/24/2021  Post procedure Call Back phone  # [phone]  Permission to leave phone message Yes  Some recent data might be hidden     Patient questions:  Do you have a fever, pain , or abdominal swelling? No. Pain Score  0 *  Have you tolerated food without any problems? Yes.    Have you been able to return to your normal activities? Yes.    Do you have any questions about your discharge instructions: Diet   No. Medications  No. Follow up visit  No.  Do you have questions or concerns about your Care? No.  Actions: * If pain score is 4 or above: No action needed, pain <4.  Have you developed a fever since your procedure? no  2.   Have you had an respiratory symptoms (SOB or cough) since your procedure? no  3.   Have you tested positive for COVID 19 since your procedure no  4.   Have you had any family members/close contacts diagnosed with the COVID 19 since your procedure?  no   If yes to any of these questions please route to Laverna Peace, RN and Karlton Lemon, RN

## 2021-03-27 ENCOUNTER — Other Ambulatory Visit: Payer: Self-pay

## 2021-03-27 ENCOUNTER — Ambulatory Visit: Payer: Managed Care, Other (non HMO) | Attending: Primary Care | Admitting: Pharmacist

## 2021-03-27 ENCOUNTER — Encounter: Payer: Self-pay | Admitting: Pharmacist

## 2021-03-27 DIAGNOSIS — E119 Type 2 diabetes mellitus without complications: Secondary | ICD-10-CM | POA: Diagnosis not present

## 2021-03-27 NOTE — Progress Notes (Signed)
S:     No chief complaint on file.  James Robinson is a 61 y.o. male who presents for diabetes evaluation, education, and management. PMH is significant for T2DM, HTN, internal hemorrhoids, OA, and tobacco abuse. Patient was referred and last seen by Primary Care Provider, Gwinda Passe, on 01/23/2021. A1c at that visit resulted at 10.8% (up from 5.3 previously). BP was 145/79 at that visit. He was started on metformin. For BP, he was started on lisinopril.   Today, he arrives in good spirits and presents without assistance.   Patient reports Diabetes was diagnosed at last PCP visit. Before this, he was placed on prednisone for ~2 months for gout and OA. A1c came back 10.8% (up from 5.3 last year). He is no longer on steroids.  No hx of ACS, stroke. No CKD, CHF. No hx of thyroid cancer, pancreatitis.   Family/Social History:  -Fhx: negative for DM -Tobacco: former smoker (quit in 2017)  -Alcohol: none reported  Current diabetes medications include: metformin 1000 mg BID (takes 2 tablets BID) Current hypertension medications include: lisinopril 20mg  daily   Patient states that he is taking his medications as prescribed.   Patient denies hypoglycemic events.  Reported home fasting blood sugars: does not check  Reported 2 hour post-meal/random blood sugars: checks around 11 AM. Gives range 135 - 175.    Patient reports polyuria, polydipsia at diagnosis. Since starting metformin this has resolved  Patient denies neuropathy (nerve pain). Patient reports improvement in visual changes since starting metformin.  Patient reports self foot exams.   Patient reported dietary habits:  - Has transitioned to sugar-free foods and beverages   Patient-reported exercise habits:  - Limited by knee pain (recent R TKA - completed last PT appt ~3 weeks ago) - Walks in the neighborhood almost 45 minutes daily  - Plans to join a    O:  Physical Exam  ROS   Lab Results   Component Value Date   HGBA1C 10.8 (A) 01/23/2021   There were no vitals filed for this visit.  Lipid Panel     Component Value Date/Time   CHOL 154 01/27/2021 0847   TRIG 123 01/27/2021 0847   HDL 38 (L) 01/27/2021 0847   CHOLHDL 4.1 01/27/2021 0847   CHOLHDL 2.9 01/01/2014 1027   VLDL 20 01/01/2014 1027   LDLCALC 94 01/27/2021 0847    Clinical Atherosclerotic Cardiovascular Disease (ASCVD): No  The 10-year ASCVD risk score (Arnett DK, et al., 2019) is: 21.7%   Values used to calculate the score:     Age: 83 years     Sex: Male     Is Non-Hispanic African American: Yes     Diabetic: Yes     Tobacco smoker: No     Systolic Blood Pressure: 123 mmHg     Is BP treated: Yes     HDL Cholesterol: 38 mg/dL     Total Cholesterol: 154 mg/dL    A/P: Diabetes newly dx currently uncontrolled based on A1c but improving since starting metformin and making dietary adjustments. Patient is able to verbalize appropriate hypoglycemia management plan. Medication adherence appears appropriate. -Continued current regimen. -Patient educated on purpose, proper use, and potential adverse effects of metformin.  -Extensively discussed pathophysiology of diabetes, recommended lifestyle interventions, dietary effects on blood sugar control.  -Counseled on s/sx of and management of hypoglycemia.  -Next A1c anticipated 04/2021.   Written patient instructions provided. Patient verbalized understanding of treatment plan. Total time  in face to face counseling 30 minutes.    Follow up PCP clinic visit in 1 week.  Butch Penny, PharmD, Patsy Baltimore, CPP Clinical Pharmacist [facility] [phone]

## 2021-04-01 ENCOUNTER — Ambulatory Visit (INDEPENDENT_AMBULATORY_CARE_PROVIDER_SITE_OTHER): Payer: Managed Care, Other (non HMO) | Admitting: Primary Care

## 2021-04-01 ENCOUNTER — Encounter (INDEPENDENT_AMBULATORY_CARE_PROVIDER_SITE_OTHER): Payer: Self-pay | Admitting: Primary Care

## 2021-04-01 ENCOUNTER — Other Ambulatory Visit: Payer: Self-pay

## 2021-04-01 VITALS — BP 138/71 | HR 91 | Temp 97.8°F | Ht 71.0 in | Wt 240.8 lb

## 2021-04-01 DIAGNOSIS — E119 Type 2 diabetes mellitus without complications: Secondary | ICD-10-CM | POA: Diagnosis not present

## 2021-04-01 DIAGNOSIS — Z760 Encounter for issue of repeat prescription: Secondary | ICD-10-CM

## 2021-04-01 DIAGNOSIS — Z131 Encounter for screening for diabetes mellitus: Secondary | ICD-10-CM

## 2021-04-01 DIAGNOSIS — I10 Essential (primary) hypertension: Secondary | ICD-10-CM

## 2021-04-01 MED ORDER — METFORMIN HCL ER 500 MG PO TB24: ORAL_TABLET | ORAL | 1 refills | Status: DC

## 2021-04-01 MED ORDER — AMLODIPINE BESYLATE 10 MG PO TABS: 10.0000 mg | ORAL_TABLET | Freq: Every day | ORAL | 1 refills | Status: DC

## 2021-04-01 MED ORDER — LISINOPRIL 20 MG PO TABS: 20.0000 mg | ORAL_TABLET | Freq: Every day | ORAL | 1 refills | Status: DC

## 2021-04-01 NOTE — Progress Notes (Signed)
James Robinson, is a 61 y.o. male  HLK:[id number]  [id number]  DOB - [birth year]  Chief Complaint  Patient presents with   Blood Pressure Check       Subjective:   Mr.[name] is a 61 y.o. male here today for a follow up visit for HTN management. Patient has No headache, No chest pain, No abdominal pain - No Nausea, No new weakness tingling or numbness, No Cough - Shotrness of breath . No problems updated.  ALLERGIES: No Known Allergies  PAST MEDICAL HISTORY: Past Medical History:  Diagnosis Date   Anxiety    Arthritis    Depression    Diabetes mellitus without complication ([place])    Hyperlipidemia    Hypertension Dx 2008   on BP medicine for a brief period    Migraine Dx [place]: Prior to Admission medications   Medication Sig Start Date End Date Taking? Authorizing Provider  blood glucose meter kit and supplies Dispense based on patient and insurance preference. Use up to four times daily as directed. (FOR ICD-10 E10.9, E11.9). 01/29/21  Yes Kerin Perna, NP  gabapentin (NEURONTIN) 300 MG capsule Take a 300 mg capsule three times a day for two weeks following surgery.Then take a 300 mg capsule two times a day for two weeks. Then take a 300 mg capsule once a day for two weeks. Then discontinue. 12/03/20  Yes Edmisten, Kristie L, PA  PREDNISONE PO Take 1 tablet by mouth daily as needed (lower cholesterol).   Yes [provider]  amLODipine (NORVASC) 10 MG tablet Take 1 tablet (10 mg total) by mouth daily. 04/01/21   Kerin Perna, NP  lisinopril (ZESTRIL) 20 MG tablet Take 1 tablet (20 mg total) by mouth daily. 04/01/21   Kerin Perna, NP  metFORMIN (GLUCOPHAGE XR) 500 MG 24 hr tablet Take 1 tablet twice a day for 1 week. If no GI symptoms week 2 take 2 tablets twice a day 04/01/21   Kerin Perna, NP  oxyCODONE (OXY IR/ROXICODONE) 5 MG immediate release tablet Take 1-2 tablets (5-10 mg  total) by mouth every 6 (six) hours as needed for severe pain. Not to exceed 6 tablets a day. Patient not taking: Reported on 03/10/2021 12/03/20   Theresa Duty L, PA    Objective:   Vitals:   04/01/21 1532  BP: 138/71  Pulse: 91  Temp: 97.8 F (36.6 C)  TempSrc: Oral  SpO2: 100%  Weight: 240 lb 12.8 oz (109.2 kg)  Height: '5\' 11"'  (1.803 m)   Exam General appearance : Awake, alert, not in any distress. Obese male.Speech Clear. Not toxic looking HEENT: Atraumatic and Normocephalic, pupils equally reactive to light and accomodation Neck: Supple, no JVD. No cervical lymphadenopathy.  Chest: Good air entry bilaterally, no added sounds  CVS: S1 S2 regular, no murmurs.  Abdomen: Bowel sounds present, Non tender and not distended with no gaurding, rigidity or rebound. Extremities: B/L Lower Ext shows no edema, both legs are warm to touch Neurology: Awake alert, and oriented X 3, Non focal Skin: No Rash  Data Review Lab Results  Component Value Date   HGBA1C 10.8 (A) 01/23/2021   HGBA1C 5.3 09/11/2019   HGBA1C 5.5 08/10/2018    Assessment & Plan   1. Essential hypertension We have discussed target BP range and blood pressure goal. I have advised patient to check BP regularly and to call us back or report to clinic if  the numbers are consistently higher than 140/90. We discussed the importance of compliance with medical therapy and DASH diet recommended, consequences of uncontrolled hypertension discussed.  - continue current BP medications  - amLODipine (NORVASC) 10 MG tablet; Take 1 tablet (10 mg total) by mouth daily.  Dispense: 90 tablet; Refill: 1  2. Type 2 diabetes mellitus without complication, without long-term current use of insulin (HCC) Monitor foods that are high in carbohydrates are the following rice, potatoes, breads, sugars, and pastas.  Reduction in the intake (eating) will assist in lowering your blood sugars.  - metFORMIN (GLUCOPHAGE XR) 500 MG 24 hr tablet;  Take 1 tablet twice a day for 1 week. If no GI symptoms week 2 take 2 tablets twice a day  Dispense: 120 tablet; Refill: 1  3. Medication refill - amLODipine (NORVASC) 10 MG tablet; Take 1 tablet (10 mg total) by mouth daily.  Dispense: 90 tablet; Refill: 1 - lisinopril (ZESTRIL) 20 MG tablet; Take 1 tablet (20 mg total) by mouth daily.  Dispense: 90 tablet; Refill: 1 - metFORMIN (GLUCOPHAGE XR) 500 MG 24 hr tablet; Take 1 tablet twice a day for 1 week. If no GI symptoms week 2 take 2 tablets twice a day  Dispense: 120 tablet; Refill: 1    Patient have been counseled extensively about nutrition and exercise. Other issues discussed during this visit include: low cholesterol diet, weight control and daily exercise, foot care, annual eye examinations at Ophthalmology, importance of adherence with medications and regular follow-up. We also discussed long term complications of uncontrolled diabetes and hypertension.   No follow-ups on file.  The patient was given clear instructions to go to ER or return to medical center if symptoms don't improve, worsen or new problems develop. The patient verbalized understanding. The patient was told to call to get lab results if they haven't heard anything in the next week.   This note has been created with Surveyor, quantity. Any transcriptional errors are unintentional.   Kerin Perna, NP 04/06/2021, 8:09 PM

## 2021-04-06 ENCOUNTER — Encounter (INDEPENDENT_AMBULATORY_CARE_PROVIDER_SITE_OTHER): Payer: Self-pay | Admitting: Primary Care

## 2021-04-25 ENCOUNTER — Ambulatory Visit (INDEPENDENT_AMBULATORY_CARE_PROVIDER_SITE_OTHER): Payer: 59 | Admitting: Primary Care

## 2021-05-01 DIAGNOSIS — M25551 Pain in right hip: Secondary | ICD-10-CM | POA: Insufficient documentation

## 2021-05-07 ENCOUNTER — Ambulatory Visit (INDEPENDENT_AMBULATORY_CARE_PROVIDER_SITE_OTHER): Payer: Managed Care, Other (non HMO) | Admitting: Primary Care

## 2021-05-08 ENCOUNTER — Encounter (INDEPENDENT_AMBULATORY_CARE_PROVIDER_SITE_OTHER): Payer: Self-pay | Admitting: Primary Care

## 2021-05-08 ENCOUNTER — Telehealth (INDEPENDENT_AMBULATORY_CARE_PROVIDER_SITE_OTHER): Payer: Self-pay | Admitting: Primary Care

## 2021-05-08 ENCOUNTER — Ambulatory Visit (INDEPENDENT_AMBULATORY_CARE_PROVIDER_SITE_OTHER): Payer: Managed Care, Other (non HMO) | Admitting: Primary Care

## 2021-05-08 VITALS — BP 141/79 | HR 81 | Temp 97.9°F | Ht 71.0 in | Wt 235.6 lb

## 2021-05-08 DIAGNOSIS — I10 Essential (primary) hypertension: Secondary | ICD-10-CM | POA: Diagnosis not present

## 2021-05-08 DIAGNOSIS — E119 Type 2 diabetes mellitus without complications: Secondary | ICD-10-CM

## 2021-05-08 LAB — POCT GLYCOSYLATED HEMOGLOBIN (HGB A1C): Hemoglobin A1C: 6.1 % — AB (ref 4.0–5.6)

## 2021-05-08 NOTE — Telephone Encounter (Signed)
Copied from CRM [id number]. Topic: Medical Record Request - Other >> May 08, 2021  1:43 PM Marylen Ponto wrote: Patient Name/DOB/MRN #: James Robinson / DOB 13-Feb-1960 / MRN: [id number] Requestor Name/Agency: [facility] / ? Call Back #: [phone] Information Requested: Reference ID# [id number]   Route to [facility] HIM Pool for [place] clinics. For all other clinics, route to the clinic's PEC Pool.

## 2021-05-08 NOTE — Telephone Encounter (Signed)
Returned call to agency and was on hold for more than 10 minutes. Will call back. Medical records requests are sent to HIM.  ?

## 2021-05-08 NOTE — Progress Notes (Signed)
?James Robinson ? ?James Robinson, is a 61 y.o. male ? ?[id number] ? ?[id number] ? ?DOB - Jun 23, 1960 ? ?Chief Complaint  ?Patient presents with  ? Diabetes  ? surgical clearance  ?    ? ?Subjective:  ? ?[name] is a 61 y.o. male here today for a follow up visit. Requesting medical clearance for hip surgery , HTN and diabetes.He denies polyuria, polyphagia , polydipsia or vision changes .  Patient has No headache, No chest pain, No abdominal pain - No Nausea, No new weakness tingling or numbness, No Cough - shortness of breath. ? ?No problems updated. ? ?No Known Allergies ? ?Past Medical History:  ?Diagnosis Date  ? Anxiety   ? Arthritis   ? Depression   ? Diabetes mellitus without complication ([place])   ? Hyperlipidemia   ? Hypertension Dx 2008  ? on BP medicine for a brief period   ? Migraine Dx 1985  ? ? ?Current Outpatient Medications on File Prior to Visit  ?Medication Sig Dispense Refill  ? amLODipine (NORVASC) 10 MG tablet Take 1 tablet (10 mg total) by mouth daily. 90 tablet 1  ? blood glucose meter kit and supplies Dispense based on patient and insurance preference. Use up to four times daily as directed. (FOR ICD-10 E10.9, E11.9). 1 each 0  ? gabapentin (NEURONTIN) 300 MG capsule Take a 300 mg capsule three times a day for two weeks following surgery.Then take a 300 mg capsule two times a day for two weeks. Then take a 300 mg capsule once a day for two weeks. Then discontinue. 84 capsule 0  ? lisinopril (ZESTRIL) 20 MG tablet Take 1 tablet (20 mg total) by mouth daily. 90 tablet 1  ? metFORMIN (GLUCOPHAGE XR) 500 MG 24 hr tablet Take 1 tablet twice a day for 1 week. If no GI symptoms week 2 take 2 tablets twice a day 120 tablet 1  ? PREDNISONE PO Take 1 tablet by mouth daily as needed (lower cholesterol).    ? oxyCODONE (OXY IR/ROXICODONE) 5 MG immediate release tablet Take 1-2 tablets (5-10 mg total) by mouth every 6 (six) hours as needed for severe pain. Not to exceed 6 tablets a  day. (Patient not taking: Reported on 03/10/2021) 42 tablet 0  ? ?No current facility-administered medications on file prior to visit.  ? ? ?Objective:  ? ?Vitals:  ? 05/08/21 1131 05/08/21 1148  ?BP: (!) 153/85 (!) 141/79  ?Pulse: 78 81  ?Temp: 97.9 ?F (36.6 ?C)   ?TempSrc: Oral   ?SpO2: 97%   ?Weight: 235 lb 9.6 oz (106.9 kg)   ?Height: 5' 11" (1.803 m)   ? ? ?Exam ?General appearance : Awake, alert, not in any distress. Speech Clear. Not toxic looking ?HEENT: Atraumatic and Normocephalic, pupils equally reactive to light and accomodation ?Neck: Supple, no JVD. No cervical lymphadenopathy.  ?Chest: Good air entry bilaterally, no added sounds  ?CVS: S1 S2 regular, no murmurs.  ?Abdomen: Bowel sounds present, Non tender and not distended with no gaurding, rigidity or rebound. ?Extremities: B/L Lower Ext shows no edema, both legs are warm to touch ?Neurology: Awake alert, and oriented X 3, CN II-XII intact, Non focal ?Skin: No Rash ? ?Data Review ?Lab Results  ?Component Value Date  ? HGBA1C 6.1 (A) 05/08/2021  ? HGBA1C 10.8 (A) 01/23/2021  ? HGBA1C 5.3 09/11/2019  ? ? ?Assessment & Plan  ?Kaci was seen today for diabetes and surgical clearance. ? ?Diagnoses and all orders for this visit: ? ?  1. Type 2 diabetes mellitus without complication, without long-term current use of insulin ([place]) ?- HgB A1c 6.1 vast improvement since 12/22 10.8 very pleased. Continue metformin 520m XR daily .  ?Continue to monitor foods that are high in carbohydrates are the following rice, potatoes, breads, sugars, and pastas.  Reduction in the intake (eating) will assist in lowering your blood sugars.  ? ?Essential hypertension ?BP goal - < 130/80 ?Explained that having normal blood pressure is the goal and medications are helping to get to goal and maintain normal blood pressure. ?DIET: Limit salt intake, read nutrition labels to check salt content, limit fried and high fatty foods  ?Avoid using multisymptom OTC cold preparations that  generally contain sudafed which can rise BP. Consult with pharmacist on best cold relief products to use for persons with HTN ?EXERCISE ?Discussed incorporating exercise such as walking - 30 minutes most days of the week and can do in 10 minute intervals    ?  ? ?Patient have been counseled extensively about nutrition and exercise. Other issues discussed during this visit include: low cholesterol diet, weight control and daily exercise, foot care, annual eye examinations at Ophthalmology, importance of adherence with medications and regular follow-up. We also discussed long term complications of uncontrolled diabetes and hypertension.  ? ?No follow-ups on file. ? ?The patient was given clear instructions to go to ER or return to medical center if symptoms don't improve, worsen or new problems develop. The patient verbalized understanding. The patient was told to call to get lab results if they haven't heard anything in the next week.  ? ?This note has been created with DSurveyor, quantity Any transcriptional errors are unintentional.  ? ?MKerin Perna NP ?05/11/2021, 10:49 PM  ?May 11, 2021 ? ?   ?

## 2021-05-09 NOTE — Telephone Encounter (Signed)
Spoke with an agent and informed that request was received and then sent to [facility] which is were our medical records are released from. Provided agent with phone number to contact for records. [phone].  ?

## 2021-05-15 ENCOUNTER — Encounter (INDEPENDENT_AMBULATORY_CARE_PROVIDER_SITE_OTHER): Payer: Self-pay | Admitting: Primary Care

## 2021-05-15 ENCOUNTER — Ambulatory Visit (INDEPENDENT_AMBULATORY_CARE_PROVIDER_SITE_OTHER): Payer: Managed Care, Other (non HMO) | Admitting: Primary Care

## 2021-05-15 VITALS — BP 148/77 | HR 73 | Temp 97.7°F | Ht 71.0 in | Wt 238.6 lb

## 2021-05-15 DIAGNOSIS — M25552 Pain in left hip: Secondary | ICD-10-CM

## 2021-05-15 DIAGNOSIS — Z01818 Encounter for other preprocedural examination: Secondary | ICD-10-CM

## 2021-05-15 DIAGNOSIS — I10 Essential (primary) hypertension: Secondary | ICD-10-CM

## 2021-05-15 LAB — POCT URINALYSIS DIP (CLINITEK)
Bilirubin, UA: NEGATIVE
Glucose, UA: NEGATIVE mg/dL
Ketones, POC UA: NEGATIVE mg/dL
Leukocytes, UA: NEGATIVE
Nitrite, UA: NEGATIVE
Spec Grav, UA: 1.025 (ref 1.010–1.025)
Urobilinogen, UA: 0.2 E.U./dL
pH, UA: 5.5 (ref 5.0–8.0)

## 2021-05-15 NOTE — Progress Notes (Signed)
?James Robinson ? ?Keagan Brislin, is a 61 y.o. male ? ?[id number] ? ?[id number] ? ?DOB - 1960-06-26 ? ?Chief Complaint  ?Patient presents with  ? Medical Clearance  ?  With EKG and labs  ? Blood Pressure Check  ?    ? ?Subjective:  ? ?Mr.James Robinson is a 61 y.o. male here today for a follow up for medical clearance .Osteoarthritis of left hip joint- constant pain 10/10.  Patient has No headache, No chest pain, No abdominal pain - No Nausea, No new weakness tingling or numbness, No Cough - shortness of breath ? ?No problems updated. ? ?No Known Allergies ? ?Past Medical History:  ?Diagnosis Date  ? Anxiety   ? Arthritis   ? Depression   ? Diabetes mellitus without complication ([place])   ? Hyperlipidemia   ? Hypertension Dx 2008  ? on BP medicine for a brief period   ? Migraine Dx 1985  ? ? ?Current Outpatient Medications on File Prior to Visit  ?Medication Sig Dispense Refill  ? amLODipine (NORVASC) 10 MG tablet Take 1 tablet (10 mg total) by mouth daily. 90 tablet 1  ? blood glucose meter kit and supplies Dispense based on patient and insurance preference. Use up to four times daily as directed. (FOR ICD-10 E10.9, E11.9). 1 each 0  ? gabapentin (NEURONTIN) 300 MG capsule Take a 300 mg capsule three times a day for two weeks following surgery.Then take a 300 mg capsule two times a day for two weeks. Then take a 300 mg capsule once a day for two weeks. Then discontinue. 84 capsule 0  ? lisinopril (ZESTRIL) 20 MG tablet Take 1 tablet (20 mg total) by mouth daily. 90 tablet 1  ? metFORMIN (GLUCOPHAGE XR) 500 MG 24 hr tablet Take 1 tablet twice a day for 1 week. If no GI symptoms week 2 take 2 tablets twice a day 120 tablet 1  ? ?No current facility-administered medications on file prior to visit.  ? ? ?Objective:  ? ?Vitals:  ? 05/15/21 0923 05/15/21 0934  ?BP: (!) 162/85 (!) 148/77  ?Pulse: 89 73  ?Temp: 97.7 ?F (36.5 ?C)   ?TempSrc: Oral   ?SpO2: 97%   ?Weight: 238 lb 9.6 oz (108.2 kg)   ?Height: 5'  11" (1.803 m)   ? ? ?Exam ?General appearance : Awake, alert, not in any distress. Speech Clear. Not toxic looking ?HEENT: Atraumatic and Normocephalic, pupils equally reactive to light and accomodation ?Neck: Supple, no JVD. No cervical lymphadenopathy.  ?Chest: Good air entry bilaterally, no added sounds  ?CVS: S1 S2 regular, no murmurs.  ?Abdomen: Bowel sounds present, Non tender and not distended with no gaurding, rigidity or rebound. ?Extremities: B/L Lower Ext shows no edema, both legs are warm to touch ?Neurology: Awake alert, and oriented X 3, Non focal ?Skin: No Rash ? ?Data Review ?Lab Results  ?Component Value Date  ? HGBA1C 6.1 (A) 05/08/2021  ? HGBA1C 10.8 (A) 01/23/2021  ? HGBA1C 5.3 09/11/2019  ? ? ?Assessment & Plan  ? ?Parthiv was seen today for medical clearance and blood pressure check. ? ?Diagnoses and all orders for this visit: ? ?Acute hip pain, left ?Osteoarthritis of left hip joint- followed by ortho  ? ?Pre-operative clearance ?Will forward results to ortho ?-     CMP14+EGFR ?-     Lipid Panel ?-     CBC with Differential ?-     Protime-INR ?-     EKG 12-Lead ?-  POCT URINALYSIS DIP (CLINITEK) ? ?Essential hypertension ?We have discussed target BP range and blood pressure goal. I have advised patient to check BP regularly and to call us back or report to clinic if the numbers are consistently higher than 140/90. We discussed the importance of compliance with medical therapy and DASH diet recommended, consequences of uncontrolled hypertension discussed.  ?- continue current BP medications  ? ?Patient have been counseled extensively about nutrition and exercise. Other issues discussed during this visit include: low cholesterol diet, weight control and daily exercise, foot care, annual eye examinations at Ophthalmology, importance of adherence with medications and regular follow-up. We also discussed long term complications of uncontrolled diabetes and hypertension.  ? ?No follow-ups on  file. ? ?The patient was given clear instructions to go to ER or return to medical center if symptoms don't improve, worsen or new problems develop. The patient verbalized understanding. The patient was told to call to get lab results if they haven't heard anything in the next week.  ? ?This note has been created with Surveyor, quantity. Any transcriptional errors are unintentional.  ? ?Kerin Perna, NP ?05/20/2021, 10:51 AM  ? ?

## 2021-05-16 LAB — LIPID PANEL
Chol/HDL Ratio: 3.2 ratio (ref 0.0–5.0)
Cholesterol, Total: 155 mg/dL (ref 100–199)
HDL: 48 mg/dL
LDL Chol Calc (NIH): 88 mg/dL (ref 0–99)
Triglycerides: 101 mg/dL (ref 0–149)
VLDL Cholesterol Cal: 19 mg/dL (ref 5–40)

## 2021-05-16 LAB — CMP14+EGFR
ALT: 22 [IU]/L (ref 0–44)
AST: 15 [IU]/L (ref 0–40)
Albumin/Globulin Ratio: 1.6 (ref 1.2–2.2)
Albumin: 4.5 g/dL (ref 3.8–4.9)
Alkaline Phosphatase: 101 [IU]/L (ref 44–121)
BUN/Creatinine Ratio: 14 (ref 10–24)
BUN: 11 mg/dL (ref 8–27)
Bilirubin Total: 0.3 mg/dL (ref 0.0–1.2)
CO2: 22 mmol/L (ref 20–29)
Calcium: 8.9 mg/dL (ref 8.6–10.2)
Chloride: 104 mmol/L (ref 96–106)
Creatinine, Ser: 0.78 mg/dL (ref 0.76–1.27)
Globulin, Total: 2.9 g/dL (ref 1.5–4.5)
Glucose: 175 mg/dL — ABNORMAL HIGH (ref 70–99)
Potassium: 4.3 mmol/L (ref 3.5–5.2)
Sodium: 143 mmol/L (ref 134–144)
Total Protein: 7.4 g/dL (ref 6.0–8.5)
eGFR: 102 mL/min/{1.73_m2}

## 2021-05-16 LAB — CBC WITH DIFFERENTIAL/PLATELET
Basophils Absolute: 0 10*3/uL (ref 0.0–0.2)
Basos: 1 %
EOS (ABSOLUTE): 0.2 10*3/uL (ref 0.0–0.4)
Eos: 3 %
Hematocrit: 37.5 % (ref 37.5–51.0)
Hemoglobin: 12.6 g/dL — ABNORMAL LOW (ref 13.0–17.7)
Immature Grans (Abs): 0 10*3/uL (ref 0.0–0.1)
Immature Granulocytes: 1 %
Lymphocytes Absolute: 2 10*3/uL (ref 0.7–3.1)
Lymphs: 29 %
MCH: 31 pg (ref 26.6–33.0)
MCHC: 33.6 g/dL (ref 31.5–35.7)
MCV: 92 fL (ref 79–97)
Monocytes Absolute: 0.9 10*3/uL (ref 0.1–0.9)
Monocytes: 12 %
Neutrophils Absolute: 3.9 10*3/uL (ref 1.4–7.0)
Neutrophils: 54 %
Platelets: 292 10*3/uL (ref 150–450)
RBC: 4.07 x10E6/uL — ABNORMAL LOW (ref 4.14–5.80)
RDW: 13.6 % (ref 11.6–15.4)
WBC: 7.1 10*3/uL (ref 3.4–10.8)

## 2021-05-16 LAB — PROTIME-INR
INR: 0.9 (ref 0.9–1.2)
Prothrombin Time: 9.9 s (ref 9.1–12.0)

## 2021-05-20 ENCOUNTER — Encounter (INDEPENDENT_AMBULATORY_CARE_PROVIDER_SITE_OTHER): Payer: Self-pay | Admitting: Primary Care

## 2021-05-30 ENCOUNTER — Ambulatory Visit: Payer: Self-pay | Admitting: Student

## 2021-05-30 DIAGNOSIS — E119 Type 2 diabetes mellitus without complications: Secondary | ICD-10-CM

## 2021-06-02 ENCOUNTER — Ambulatory Visit: Payer: Self-pay | Admitting: Student

## 2021-06-17 ENCOUNTER — Ambulatory Visit: Payer: Self-pay | Admitting: Student

## 2021-06-17 NOTE — H&P (View-Only) (Signed)
TOTAL HIP ADMISSION H&P ? ?Patient is admitted for left total hip arthroplasty. ? ?Subjective: ? ?Chief Complaint: left hip pain ? ?HPI: James Robinson, 61 y.o. male, has a history of pain and functional disability in the left hip(s) due to arthritis and patient has failed non-surgical conservative treatments for greater than 12 weeks to include NSAID's and/or analgesics, flexibility and strengthening excercises, use of assistive devices, and activity modification.  Onset of symptoms was gradual starting 8 years ago with rapidlly worsening course since that time.The patient noted no past surgery on the left hip(s).  Patient currently rates pain in the left hip at 10 out of 10 with activity. Patient has night pain, worsening of pain with activity and weight bearing, trendelenberg gait, pain that interfers with activities of daily living, and pain with passive range of motion. Patient has evidence of subchondral cysts, subchondral sclerosis, periarticular osteophytes, and joint space narrowing by imaging studies. This condition presents safety issues increasing the risk of falls. There is no current active infection. ? ?Patient Active Problem List  ? Diagnosis Date Noted  ? OA (osteoarthritis) of knee 12/02/2020  ? Primary osteoarthritis of right knee 12/02/2020  ? Enlarged prostate on rectal examination 01/01/2014  ? Current smoker 01/01/2014  ? Rectal bleeding 01/01/2014  ? SHOULDER PAIN, BILATERAL 04/13/2008  ? KNEE PAIN, RIGHT 04/13/2008  ? HEMORRHOIDS, INTERNAL 08/04/2007  ? Migraine headache 01/19/2007  ? H/O: HTN (hypertension) 01/19/2007  ? ?Past Medical History:  ?Diagnosis Date  ? Anxiety   ? Arthritis   ? Depression   ? Diabetes mellitus without complication ([facility])   ? Hyperlipidemia   ? Hypertension Dx 2008  ? on BP medicine for a brief period   ? Migraine Dx 1985  ?  ?Past Surgical History:  ?Procedure Laterality Date  ? NO PAST SURGERIES    ? TOTAL KNEE ARTHROPLASTY Right 12/02/2020  ? Procedure: TOTAL  KNEE ARTHROPLASTY;  Surgeon: Aluisio, Frank, MD;  Location: [facility];  Service: Orthopedics;  Laterality: Right;  ?  ?Current Outpatient Medications  ?Medication Sig Dispense Refill Last Dose  ? amLODipine (NORVASC) 10 MG tablet Take 1 tablet (10 mg total) by mouth daily. 90 tablet 1   ? blood glucose meter kit and supplies Dispense based on patient and insurance preference. Use up to four times daily as directed. (FOR ICD-10 E10.9, E11.9). 1 each 0   ? gabapentin (NEURONTIN) 300 MG capsule Take a 300 mg capsule three times a day for two weeks following surgery.Then take a 300 mg capsule two times a day for two weeks. Then take a 300 mg capsule once a day for two weeks. Then discontinue. 84 capsule 0   ? lisinopril (ZESTRIL) 20 MG tablet Take 1 tablet (20 mg total) by mouth daily. 90 tablet 1   ? metFORMIN (GLUCOPHAGE XR) 500 MG 24 hr tablet Take 1 tablet twice a day for 1 week. If no GI symptoms week 2 take 2 tablets twice a day 120 tablet 1   ? ?No current facility-administered medications for this visit.  ? ?No Known Allergies  ?Social History  ? ?Tobacco Use  ? Smoking status: Former  ?  Packs/day: 0.50  ?  Years: 27.00  ?  Pack years: 13.50  ?  Types: Cigarettes  ?  Quit date: 07/23/2015  ?  Years since quitting: 5.9  ? Smokeless tobacco: Never  ?Substance Use Topics  ? Alcohol use: Yes  ?  Comment: occasional  ?  ?Family History  ?  Problem Relation Age of Onset  ? Glaucoma Mother   ? Diabetes Neg Hx   ? Heart disease Neg Hx   ? Cancer Neg Hx   ? Colon polyps Neg Hx   ? Colon cancer Neg Hx   ? Esophageal cancer Neg Hx   ? Ulcerative colitis Neg Hx   ? Stomach cancer Neg Hx   ?  ? ?Review of Systems  ?Musculoskeletal:  Positive for arthralgias, gait problem and myalgias.  ?All other systems reviewed and are negative. ? ?Objective: ? ?Physical Exam ?Constitutional:   ?   Appearance: Normal appearance.  ?HENT:  ?   Head: Normocephalic and atraumatic.  ?   Nose: Nose normal.  ?   Mouth/Throat:  ?   Mouth: Mucous  membranes are moist.  ?   Pharynx: Oropharynx is clear.  ?Eyes:  ?   Extraocular Movements: Extraocular movements intact.  ?Cardiovascular:  ?   Rate and Rhythm: Normal rate and regular rhythm.  ?   Pulses: Normal pulses.  ?   Heart sounds: Normal heart sounds.  ?Pulmonary:  ?   Effort: Pulmonary effort is normal.  ?   Breath sounds: Normal breath sounds.  ?Abdominal:  ?   General: Abdomen is flat.  ?   Palpations: Abdomen is soft.  ?Genitourinary: ?   Comments: deferred ?Musculoskeletal:  ?   Cervical back: Normal range of motion and neck supple.  ?   Left hip: Tenderness present. Decreased range of motion. Decreased strength.  ?   Comments: Left knee reveals a well healed incision consistent with right TKA.   ?Skin: ?   General: Skin is warm and dry.  ?   Capillary Refill: Capillary refill takes less than 2 seconds.  ?Neurological:  ?   General: No focal deficit present.  ?   Mental Status: He is alert and oriented to person, place, and time.  ?Psychiatric:     ?   Mood and Affect: Mood normal.     ?   Behavior: Behavior normal.     ?   Thought Content: Thought content normal.     ?   Judgment: Judgment normal.  ? ? ?Vital signs in last 24 hours: ?@VSRANGES@ ? ?Labs: ? ? ?Estimated body mass index is 33.28 kg/m? as calculated from the following: ?  Height as of 05/15/21: 5' 11" (1.803 m). ?  Weight as of 05/15/21: 108.2 kg. ? ? ?Imaging Review ?Plain radiographs demonstrate severe degenerative joint disease of the left hip(s). The bone quality appears to be adequate for age and reported activity level. ? ? ? ? ? ?Assessment/Plan: ? ?End stage arthritis, left hip(s) ? ?The patient history, physical examination, clinical judgement of the provider and imaging studies are consistent with end stage degenerative joint disease of the left hip(s) and total hip arthroplasty is deemed medically necessary. The treatment options including medical management, injection therapy, arthroscopy and arthroplasty were discussed at  length. The risks and benefits of total hip arthroplasty were presented and reviewed. The risks due to aseptic loosening, infection, stiffness, dislocation/subluxation,  thromboembolic complications and other imponderables were discussed.  The patient acknowledged the explanation, agreed to proceed with the plan and consent was signed. Patient is being admitted for inpatient treatment for surgery, pain control, PT, OT, prophylactic antibiotics, VTE prophylaxis, progressive ambulation and ADL's and discharge planning.The patient is planning to be discharged home with HEP after an overnight stay. ? ?Disposition: Home with sister and neighbor ?Planned DVT Prophylaxis: aspirin 81mg BID ?  DME needed: None. Has rolling walker and rollator at home. Discussed not using the rollator after surgery. Patient is in agreement with this plan. PCP: Cleared TXA: Yes Allergies: NKDA Anesthesia Concerns: None BMI: 34.6 Last HgbA1c: 6.1 Other: - Diabetes Type 2 uncomplicated - Hydrocodone, methocarbamol, meloxicam. - Right TKA.   Instructed patient on which medications to discontinue 5 days prior to surgery. Including vitamins, supplements, naproxen, NSAIDs.   Patient's anticipated LOS is less than 2 midnights, meeting these requirements: - Younger than 59 - Lives within 1 hour of care - Has a competent adult at home to recover with post-op recover - NO history of  - Chronic pain requiring opioids  - Coronary Artery Disease  - Heart failure  - Heart attack  - Stroke  - DVT/VTE  - Cardiac arrhythmia  - Respiratory Failure/COPD  - Renal failure  - Anemia  - Advanced Liver disease

## 2021-06-17 NOTE — H&P (Signed)
TOTAL HIP ADMISSION H&P ? ?Patient is admitted for left total hip arthroplasty. ? ?Subjective: ? ?Chief Complaint: left hip pain ? ?HPI: James Robinson, 61 y.o. male, has a history of pain and functional disability in the left hip(s) due to arthritis and patient has failed non-surgical conservative treatments for greater than 12 weeks to include NSAID's and/or analgesics, flexibility and strengthening excercises, use of assistive devices, and activity modification.  Onset of symptoms was gradual starting 8 years ago with rapidlly worsening course since that time.The patient noted no past surgery on the left hip(s).  Patient currently rates pain in the left hip at 10 out of 10 with activity. Patient has night pain, worsening of pain with activity and weight bearing, trendelenberg gait, pain that interfers with activities of daily living, and pain with passive range of motion. Patient has evidence of subchondral cysts, subchondral sclerosis, periarticular osteophytes, and joint space narrowing by imaging studies. This condition presents safety issues increasing the risk of falls. There is no current active infection. ? ?Patient Active Problem List  ? Diagnosis Date Noted  ? OA (osteoarthritis) of knee 12/02/2020  ? Primary osteoarthritis of right knee 12/02/2020  ? Enlarged prostate on rectal examination 01/01/2014  ? Current smoker 01/01/2014  ? Rectal bleeding 01/01/2014  ? SHOULDER PAIN, BILATERAL 04/13/2008  ? KNEE PAIN, RIGHT 04/13/2008  ? HEMORRHOIDS, INTERNAL 08/04/2007  ? Migraine headache 01/19/2007  ? H/O: HTN (hypertension) 01/19/2007  ? ?Past Medical History:  ?Diagnosis Date  ? Anxiety   ? Arthritis   ? Depression   ? Diabetes mellitus without complication (HCC)   ? Hyperlipidemia   ? Hypertension Dx 2008  ? on BP medicine for a brief period   ? Migraine Dx 1985  ?  ?Past Surgical History:  ?Procedure Laterality Date  ? NO PAST SURGERIES    ? TOTAL KNEE ARTHROPLASTY Right 12/02/2020  ? Procedure: TOTAL  KNEE ARTHROPLASTY;  Surgeon: Aluisio, Frank, MD;  Location: [facility];  Service: Orthopedics;  Laterality: Right;  ?  ?Current Outpatient Medications  ?Medication Sig Dispense Refill Last Dose  ? amLODipine (NORVASC) 10 MG tablet Take 1 tablet (10 mg total) by mouth daily. 90 tablet 1   ? blood glucose meter kit and supplies Dispense based on patient and insurance preference. Use up to four times daily as directed. (FOR ICD-10 E10.9, E11.9). 1 each 0   ? gabapentin (NEURONTIN) 300 MG capsule Take a 300 mg capsule three times a day for two weeks following surgery.Then take a 300 mg capsule two times a day for two weeks. Then take a 300 mg capsule once a day for two weeks. Then discontinue. 84 capsule 0   ? lisinopril (ZESTRIL) 20 MG tablet Take 1 tablet (20 mg total) by mouth daily. 90 tablet 1   ? metFORMIN (GLUCOPHAGE XR) 500 MG 24 hr tablet Take 1 tablet twice a day for 1 week. If no GI symptoms week 2 take 2 tablets twice a day 120 tablet 1   ? ?No current facility-administered medications for this visit.  ? ?No Known Allergies  ?Social History  ? ?Tobacco Use  ? Smoking status: Former  ?  Packs/day: 0.50  ?  Years: 27.00  ?  Pack years: 13.50  ?  Types: Cigarettes  ?  Quit date: 07/23/2015  ?  Years since quitting: 5.9  ? Smokeless tobacco: Never  ?Substance Use Topics  ? Alcohol use: Yes  ?  Comment: occasional  ?  ?Family History  ?  Problem Relation Age of Onset  ? Glaucoma Mother   ? Diabetes Neg Hx   ? Heart disease Neg Hx   ? Cancer Neg Hx   ? Colon polyps Neg Hx   ? Colon cancer Neg Hx   ? Esophageal cancer Neg Hx   ? Ulcerative colitis Neg Hx   ? Stomach cancer Neg Hx   ?  ? ?Review of Systems  ?Musculoskeletal:  Positive for arthralgias, gait problem and myalgias.  ?All other systems reviewed and are negative. ? ?Objective: ? ?Physical Exam ?Constitutional:   ?   Appearance: Normal appearance.  ?HENT:  ?   Head: Normocephalic and atraumatic.  ?   Nose: Nose normal.  ?   Mouth/Throat:  ?   Mouth: Mucous  membranes are moist.  ?   Pharynx: Oropharynx is clear.  ?Eyes:  ?   Extraocular Movements: Extraocular movements intact.  ?Cardiovascular:  ?   Rate and Rhythm: Normal rate and regular rhythm.  ?   Pulses: Normal pulses.  ?   Heart sounds: Normal heart sounds.  ?Pulmonary:  ?   Effort: Pulmonary effort is normal.  ?   Breath sounds: Normal breath sounds.  ?Abdominal:  ?   General: Abdomen is flat.  ?   Palpations: Abdomen is soft.  ?Genitourinary: ?   Comments: deferred ?Musculoskeletal:  ?   Cervical back: Normal range of motion and neck supple.  ?   Left hip: Tenderness present. Decreased range of motion. Decreased strength.  ?   Comments: Left knee reveals a well healed incision consistent with right TKA.   ?Skin: ?   General: Skin is warm and dry.  ?   Capillary Refill: Capillary refill takes less than 2 seconds.  ?Neurological:  ?   General: No focal deficit present.  ?   Mental Status: He is alert and oriented to person, place, and time.  ?Psychiatric:     ?   Mood and Affect: Mood normal.     ?   Behavior: Behavior normal.     ?   Thought Content: Thought content normal.     ?   Judgment: Judgment normal.  ? ? ?Vital signs in last 24 hours: ?_0 @ ? ?Labs: ? ? ?Estimated body mass index is 33.28 kg/m? as calculated from the following: ?  Height as of 05/15/21: 5' 11" (1.803 m). ?  Weight as of 05/15/21: 108.2 kg. ? ? ?Imaging Review ?Plain radiographs demonstrate severe degenerative joint disease of the left hip(s). The bone quality appears to be adequate for age and reported activity level. ? ? ? ? ? ?Assessment/Plan: ? ?End stage arthritis, left hip(s) ? ?The patient history, physical examination, clinical judgement of the provider and imaging studies are consistent with end stage degenerative joint disease of the left hip(s) and total hip arthroplasty is deemed medically necessary. The treatment options including medical management, injection therapy, arthroscopy and arthroplasty were discussed at  length. The risks and benefits of total hip arthroplasty were presented and reviewed. The risks due to aseptic loosening, infection, stiffness, dislocation/subluxation,  thromboembolic complications and other imponderables were discussed.  The patient acknowledged the explanation, agreed to proceed with the plan and consent was signed. Patient is being admitted for inpatient treatment for surgery, pain control, PT, OT, prophylactic antibiotics, VTE prophylaxis, progressive ambulation and ADL's and discharge planning.The patient is planning to be discharged home with HEP after an overnight stay. ? ?Disposition: Home with sister and neighbor ?Planned DVT Prophylaxis: aspirin 67m BID ?  DME needed: None. Has rolling walker and rollator at home. Discussed not using the rollator after surgery. Patient is in agreement with this plan. ?PCP: Cleared ?TXA: Yes ?Allergies: NKDA ?Anesthesia Concerns: None ?BMI: 34.6 ?Last HgbA1c: 6.1 ?Other: ?- Diabetes Type 2 uncomplicated ?- Hydrocodone, methocarbamol, meloxicam. ?- Right TKA.  ? ?Instructed patient on which medications to discontinue 5 days prior to surgery. Including vitamins, supplements, naproxen, NSAIDs.  ? ?Patient's anticipated LOS is less than 2 midnights, meeting these requirements: ?- Younger than 65 ?- Lives within 1 hour of care ?- Has a competent adult at home to recover with post-op recover ?- NO history of ? - Chronic pain requiring opioids ? - Coronary Artery Disease ? - Heart failure ? - Heart attack ? - Stroke ? - DVT/VTE ? - Cardiac arrhythmia ? - Respiratory Failure/COPD ? - Renal failure ? - Anemia ? - Advanced Liver disease ? ? ?

## 2021-06-19 NOTE — Progress Notes (Addendum)
Anesthesia Review: ? ?PCP: Gwinda Passe- LOV 05/15/21  ?Cardiologist : none  ?Chest x-ray : ?EKG :05/15/2021  ?Echo : ?Stress test: ?Cardiac Cath :  ?Activity level: can do a flight of stairs without difficulty  ?Sleep Study/ CPAP : none  ?Fasting Blood Sugar :      / Checks Blood Sugar -- times a day:   ?Blood Thinner/ Instructions /Last Dose: ?ASA / Instructions/ Last Dose :   ?DM- type 2 ?Checks glucose 2x daily at home  ?Hgba1c- 05/08/21- 6.1 ?Hgba1c- 06/24/21-  6.2  ?PT was 15 minutes late for preop appt.   ?

## 2021-06-19 NOTE — Progress Notes (Addendum)
DUE TO COVID-19 ONLY  2  VISITOR IS ALLOWED TO COME WITH YOU AND STAY IN THE WAITING ROOM ONLY DURING PRE OP AND PROCEDURE DAY OF SURGERY.   4  VISITOR  MAY VISIT WITH YOU AFTER SURGERY IN YOUR PRIVATE ROOM DURING VISITING HOURS ONLY! ?YOU MAY HAVE ONE PERSON SPEND THE NITE WITH YOU IN YOUR ROOM AFTER SURGERY.   ? ? Your procedure is scheduled on:  ?            06/26/2021  ? Report to [facility] Main  Entrance ? ? Report to admitting at        [place]          AM ?DO NOT [place], PICTURE ID OR WALLET DAY OF SURGERY.  ?  ? ? Call this number if you have problems the morning of surgery [phone]  ? ? REMEMBER: NO  SOLID FOODS , CANDY, GUM OR MINTS AFTER MIDNITE THE NITE BEFORE SURGERY .       Marland Kitchen CLEAR LIQUIDS UNTIL    0730am             DAY OF SURGERY.      PLEASE FINISH ENSURE DRINK PER SURGEON ORDER  WHICH NEEDS TO BE COMPLETED AT    0730am      MORNING OF SURGERY.   ? ? ? ? ?CLEAR LIQUID DIET ? ? ?Foods Allowed      ?WATER ?BLACK COFFEE ( SUGAR OK, NO MILK, CREAM OR CREAMER) REGULAR AND DECAF  ?TEA ( SUGAR OK NO MILK, CREAM, OR CREAMER) REGULAR AND DECAF  ?PLAIN JELLO ( NO RED)  ?FRUIT ICES ( NO RED, NO FRUIT PULP)  ?POPSICLES ( NO RED)  ?JUICE- APPLE, WHITE GRAPE AND WHITE CRANBERRY  ?SPORT DRINK LIKE GATORADE ( NO RED)  ?CLEAR BROTH ( VEGETABLE , CHICKEN OR BEEF)                                                               ? ?    ? ?BRUSH YOUR TEETH MORNING OF SURGERY AND RINSE YOUR MOUTH OUT, NO CHEWING GUM CANDY OR MINTS. ?  ? ? Take these medicines the morning of surgery with A SIP OF WATER:   amlodipine  ? ? ?DO NOT TAKE ANY DIABETIC MEDICATIONS DAY OF YOUR SURGERY ?                  ?            You may not have any metal on your body including hair pins and  ?            piercings  Do not wear jewelry, make-up, lotions, powders or perfumes, deodorant ?            Do not wear nail polish on your fingernails.   ?           IF YOU ARE A MALE AND WANT TO SHAVE UNDER ARMS OR LEGS PRIOR TO  SURGERY YOU MUST DO SO AT LEAST 48 HOURS PRIOR TO SURGERY.  ?            Men may shave face and neck. ? ? Do not bring valuables to the hospital. [facility] NOT ?  RESPONSIBLE   FOR VALUABLES. ? Contacts, dentures or bridgework may not be worn into surgery. ? Leave suitcase in the car. After surgery it may be brought to your room. ? ?  ? Patients discharged the day of surgery will not be allowed to drive home. IF YOU ARE HAVING SURGERY AND GOING HOME THE SAME DAY, YOU MUST HAVE AN ADULT TO DRIVE YOU HOME AND BE WITH YOU FOR 24 HOURS. YOU MAY GO HOME BY TAXI OR UBER OR ORTHERWISE, BUT AN ADULT MUST ACCOMPANY YOU HOME AND STAY WITH YOU FOR 24 HOURS. ?  ? ?            Please read over the following fact sheets you were given: ?_____________________________________________________________________ ? ? - Preparing for Surgery ?Before surgery, you can play an important role.  Because skin is not sterile, your skin needs to be as free of germs as possible.  You can reduce the number of germs on your skin by washing with CHG (chlorahexidine gluconate) soap before surgery.  CHG is an antiseptic cleaner which kills germs and bonds with the skin to continue killing germs even after washing. ?Please DO NOT use if you have an allergy to CHG or antibacterial soaps.  If your skin becomes reddened/irritated stop using the CHG and inform your nurse when you arrive at Short Stay. ?Do not shave (including legs and underarms) for at least 48 hours prior to the first CHG shower.  You may shave your face/neck. ?Please follow these instructions carefully: ? 1.  Shower with CHG Soap the night before surgery and the  morning of Surgery. ? 2.  If you choose to wash your hair, wash your hair first as usual with your  normal  shampoo. ? 3.  After you shampoo, rinse your hair and body thoroughly to remove the  shampoo.                           4.  Use CHG as you would any other liquid soap.  You can apply chg directly   to the skin and wash  ?                     Gently with a scrungie or clean washcloth. ? 5.  Apply the CHG Soap to your body ONLY FROM THE NECK DOWN.   Do not use on face/ open      ?                     Wound or open sores. Avoid contact with eyes, ears mouth and genitals (private parts).  ?                     Production manager,  Genitals (private parts) with your normal soap. ?            6.  Wash thoroughly, paying special attention to the area where your surgery  will be performed. ? 7.  Thoroughly rinse your body with warm water from the neck down. ? 8.  DO NOT shower/wash with your normal soap after using and rinsing off  the CHG Soap. ?               9.  Pat yourself dry with a clean towel. ?           10.  Wear clean pajamas. ?  11.  Place clean sheets on your bed the night of your first shower and do not  sleep with pets. ?Day of Surgery : ?Do not apply any lotions/deodorants the morning of surgery.  Please wear clean clothes to the hospital/surgery center. ? ?FAILURE TO FOLLOW THESE INSTRUCTIONS MAY RESULT IN THE CANCELLATION OF YOUR SURGERY ?PATIENT SIGNATURE_________________________________ ? ?NURSE SIGNATURE__________________________________ ? ?________________________________________________________________________  ? ? ?           ?

## 2021-06-24 ENCOUNTER — Encounter (HOSPITAL_COMMUNITY): Payer: Self-pay

## 2021-06-24 ENCOUNTER — Encounter (HOSPITAL_COMMUNITY)
Admission: RE | Admit: 2021-06-24 | Discharge: 2021-06-24 | Disposition: A | Discharge status: Home / Self Care | Payer: Commercial Managed Care - HMO | Source: Ambulatory Visit | Attending: Orthopedic Surgery | Admitting: Orthopedic Surgery

## 2021-06-24 ENCOUNTER — Other Ambulatory Visit: Payer: Self-pay

## 2021-06-24 VITALS — BP 150/71 | HR 81 | Temp 98.4°F | Resp 16 | Ht 71.0 in | Wt 228.0 lb

## 2021-06-24 DIAGNOSIS — Z01812 Encounter for preprocedural laboratory examination: Secondary | ICD-10-CM | POA: Diagnosis not present

## 2021-06-24 DIAGNOSIS — E119 Type 2 diabetes mellitus without complications: Secondary | ICD-10-CM | POA: Diagnosis not present

## 2021-06-24 DIAGNOSIS — Z01818 Encounter for other preprocedural examination: Secondary | ICD-10-CM

## 2021-06-24 LAB — GLUCOSE, CAPILLARY: Glucose-Capillary: 220 mg/dL — ABNORMAL HIGH (ref 70–99)

## 2021-06-24 LAB — BASIC METABOLIC PANEL
Anion gap: 6 (ref 5–15)
BUN: 12 mg/dL (ref 6–20)
CO2: 25 mmol/L (ref 22–32)
Calcium: 8.7 mg/dL — ABNORMAL LOW (ref 8.9–10.3)
Chloride: 107 mmol/L (ref 98–111)
Creatinine, Ser: 0.87 mg/dL (ref 0.61–1.24)
GFR, Estimated: >60 mL/min (ref >60)
Glucose, Bld: 227 mg/dL — ABNORMAL HIGH (ref 70–99)
Potassium: 4 mmol/L (ref 3.5–5.1)
Sodium: 138 mmol/L (ref 135–145)

## 2021-06-24 LAB — CBC
HCT: 33.4 % — ABNORMAL LOW (ref 39.0–52.0)
Hemoglobin: 11.6 g/dL — ABNORMAL LOW (ref 13.0–17.0)
MCH: 32.4 pg (ref 26.0–34.0)
MCHC: 34.7 g/dL (ref 30.0–36.0)
MCV: 93.3 fL (ref 80.0–100.0)
Platelets: 249 10*3/uL (ref 150–400)
RBC: 3.58 MIL/uL — ABNORMAL LOW (ref 4.22–5.81)
RDW: 12.7 % (ref 11.5–15.5)
WBC: 7.2 10*3/uL (ref 4.0–10.5)
nRBC: 0 % (ref 0.0–0.2)

## 2021-06-24 LAB — HEMOGLOBIN A1C
Hgb A1c MFr Bld: 6.2 % — ABNORMAL HIGH (ref 4.8–5.6)
Mean Plasma Glucose: 131.24 mg/dL

## 2021-06-24 LAB — SURGICAL PCR SCREEN
MRSA, PCR: NEGATIVE
Staphylococcus aureus: POSITIVE — AB

## 2021-06-26 ENCOUNTER — Ambulatory Visit (HOSPITAL_COMMUNITY)
Admission: RE | Admit: 2021-06-26 | Discharge: 2021-06-27 | Disposition: A | Discharge status: Home / Self Care | Payer: Commercial Managed Care - HMO | Attending: Orthopedic Surgery | Admitting: Orthopedic Surgery

## 2021-06-26 ENCOUNTER — Ambulatory Visit (HOSPITAL_BASED_OUTPATIENT_CLINIC_OR_DEPARTMENT_OTHER): Payer: Commercial Managed Care - HMO | Admitting: Certified Registered Nurse Anesthetist

## 2021-06-26 ENCOUNTER — Encounter (HOSPITAL_COMMUNITY): Payer: Self-pay | Admitting: Orthopedic Surgery

## 2021-06-26 ENCOUNTER — Ambulatory Visit (HOSPITAL_COMMUNITY): Payer: Commercial Managed Care - HMO

## 2021-06-26 ENCOUNTER — Ambulatory Visit (HOSPITAL_COMMUNITY): Payer: Commercial Managed Care - HMO | Admitting: Physician Assistant

## 2021-06-26 ENCOUNTER — Encounter (HOSPITAL_COMMUNITY)
Admission: RE | Disposition: A | Discharge status: Home / Self Care | Payer: Self-pay | Source: Home / Self Care | Attending: Orthopedic Surgery

## 2021-06-26 ENCOUNTER — Other Ambulatory Visit: Payer: Self-pay

## 2021-06-26 DIAGNOSIS — Z7984 Long term (current) use of oral hypoglycemic drugs: Secondary | ICD-10-CM

## 2021-06-26 DIAGNOSIS — I10 Essential (primary) hypertension: Secondary | ICD-10-CM | POA: Insufficient documentation

## 2021-06-26 DIAGNOSIS — F1290 Cannabis use, unspecified, uncomplicated: Secondary | ICD-10-CM | POA: Diagnosis not present

## 2021-06-26 DIAGNOSIS — E119 Type 2 diabetes mellitus without complications: Secondary | ICD-10-CM

## 2021-06-26 DIAGNOSIS — Z79899 Other long term (current) drug therapy: Secondary | ICD-10-CM | POA: Insufficient documentation

## 2021-06-26 DIAGNOSIS — Z87891 Personal history of nicotine dependence: Secondary | ICD-10-CM | POA: Diagnosis not present

## 2021-06-26 DIAGNOSIS — M1612 Unilateral primary osteoarthritis, left hip: Secondary | ICD-10-CM | POA: Diagnosis present

## 2021-06-26 DIAGNOSIS — Z96642 Presence of left artificial hip joint: Secondary | ICD-10-CM

## 2021-06-26 DIAGNOSIS — Z01818 Encounter for other preprocedural examination: Secondary | ICD-10-CM

## 2021-06-26 HISTORY — PX: TOTAL HIP ARTHROPLASTY: SHX124

## 2021-06-26 LAB — TYPE AND SCREEN
ABO/RH(D): O POS
Antibody Screen: NEGATIVE

## 2021-06-26 LAB — GLUCOSE, CAPILLARY
Glucose-Capillary: 158 mg/dL — ABNORMAL HIGH (ref 70–99)
Glucose-Capillary: 167 mg/dL — ABNORMAL HIGH (ref 70–99)
Glucose-Capillary: 261 mg/dL — ABNORMAL HIGH (ref 70–99)
Glucose-Capillary: 273 mg/dL — ABNORMAL HIGH (ref 70–99)

## 2021-06-26 LAB — ABO/RH: ABO/RH(D): O POS

## 2021-06-26 SURGERY — ARTHROPLASTY, HIP, TOTAL, ANTERIOR APPROACH
Anesthesia: Spinal | Site: Hip | Laterality: Left

## 2021-06-26 MED ORDER — BUPIVACAINE IN DEXTROSE 0.75-8.25 % IT SOLN
INTRATHECAL | Status: DC | PRN
Start: 2021-06-26 — End: 2021-06-26
  Administered 2021-06-26: 2 mL via INTRATHECAL

## 2021-06-26 MED ORDER — ACETAMINOPHEN 500 MG PO TABS
1000.0000 mg | ORAL_TABLET | Freq: Once | ORAL | Status: AC
  Administered 2021-06-26: 1000 mg via ORAL
  Filled 2021-06-26: qty 2

## 2021-06-26 MED ORDER — METHOCARBAMOL 500 MG PO TABS
500.0000 mg | ORAL_TABLET | Freq: Four times a day (QID) | ORAL | Status: DC | PRN
  Administered 2021-06-26 – 2021-06-27 (×3): 500 mg via ORAL
  Filled 2021-06-26 (×3): qty 1

## 2021-06-26 MED ORDER — TRANEXAMIC ACID-NACL 1000-0.7 MG/100ML-% IV SOLN
1000.0000 mg | INTRAVENOUS | Status: AC
  Administered 2021-06-26: 1000 mg via INTRAVENOUS
  Filled 2021-06-26: qty 100

## 2021-06-26 MED ORDER — MORPHINE SULFATE (PF) 2 MG/ML IV SOLN
0.5000 mg | INTRAVENOUS | Status: DC | PRN
  Administered 2021-06-26: 1 mg via INTRAVENOUS
  Filled 2021-06-26: qty 1

## 2021-06-26 MED ORDER — HYDROCODONE-ACETAMINOPHEN 7.5-325 MG PO TABS: 1.0000 | ORAL_TABLET | ORAL | Status: DC | PRN

## 2021-06-26 MED ORDER — INSULIN ASPART 100 UNIT/ML IJ SOLN
0.0000 [IU] | Freq: Three times a day (TID) | INTRAMUSCULAR | Status: DC
  Administered 2021-06-26 – 2021-06-27 (×2): 5 [IU] via SUBCUTANEOUS

## 2021-06-26 MED ORDER — AMISULPRIDE (ANTIEMETIC) 5 MG/2ML IV SOLN: 10.0000 mg | Freq: Once | INTRAVENOUS | Status: DC | PRN

## 2021-06-26 MED ORDER — KETOROLAC TROMETHAMINE 15 MG/ML IJ SOLN
15.0000 mg | Freq: Four times a day (QID) | INTRAMUSCULAR | Status: DC
  Administered 2021-06-26 – 2021-06-27 (×3): 15 mg via INTRAVENOUS
  Filled 2021-06-26 (×3): qty 1

## 2021-06-26 MED ORDER — ACETAMINOPHEN 325 MG PO TABS: 325.0000 mg | ORAL_TABLET | Freq: Four times a day (QID) | ORAL | Status: DC | PRN

## 2021-06-26 MED ORDER — METOCLOPRAMIDE HCL 5 MG/ML IJ SOLN: 5.0000 mg | Freq: Three times a day (TID) | INTRAMUSCULAR | Status: DC | PRN

## 2021-06-26 MED ORDER — POLYETHYLENE GLYCOL 3350 17 G PO PACK: 17.0000 g | PACK | Freq: Every day | ORAL | Status: DC | PRN

## 2021-06-26 MED ORDER — ONDANSETRON HCL 4 MG/2ML IJ SOLN
INTRAMUSCULAR | Status: AC
  Filled 2021-06-26: qty 2

## 2021-06-26 MED ORDER — PHENYLEPHRINE 80 MCG/ML (10ML) SYRINGE FOR IV PUSH (FOR BLOOD PRESSURE SUPPORT)
PREFILLED_SYRINGE | INTRAVENOUS | Status: DC | PRN
  Administered 2021-06-26: 80 ug via INTRAVENOUS

## 2021-06-26 MED ORDER — CHLORHEXIDINE GLUCONATE 0.12 % MT SOLN
15.0000 mL | Freq: Once | OROMUCOSAL | Status: AC
  Administered 2021-06-26: 15 mL via OROMUCOSAL

## 2021-06-26 MED ORDER — BUPIVACAINE-EPINEPHRINE 0.25% -1:200000 IJ SOLN
INTRAMUSCULAR | Status: DC | PRN
Start: 2021-06-26 — End: 2021-06-26
  Administered 2021-06-26: 30 mL

## 2021-06-26 MED ORDER — BUPIVACAINE-EPINEPHRINE (PF) 0.25% -1:200000 IJ SOLN
INTRAMUSCULAR | Status: AC
  Filled 2021-06-26: qty 30

## 2021-06-26 MED ORDER — CEFAZOLIN SODIUM-DEXTROSE 2-4 GM/100ML-% IV SOLN
2.0000 g | Freq: Four times a day (QID) | INTRAVENOUS | Status: AC
  Administered 2021-06-26 (×2): 2 g via INTRAVENOUS
  Filled 2021-06-26 (×2): qty 100

## 2021-06-26 MED ORDER — SODIUM CHLORIDE (PF) 0.9 % IJ SOLN
INTRAMUSCULAR | Status: AC
  Filled 2021-06-26: qty 30

## 2021-06-26 MED ORDER — FENTANYL CITRATE PF 50 MCG/ML IJ SOSY: 25.0000 ug | PREFILLED_SYRINGE | INTRAMUSCULAR | Status: DC | PRN

## 2021-06-26 MED ORDER — DEXAMETHASONE SODIUM PHOSPHATE 10 MG/ML IJ SOLN
INTRAMUSCULAR | Status: AC
  Filled 2021-06-26: qty 1

## 2021-06-26 MED ORDER — PROPOFOL 500 MG/50ML IV EMUL
INTRAVENOUS | Status: DC | PRN
  Administered 2021-06-26: 50 ug/kg/min via INTRAVENOUS

## 2021-06-26 MED ORDER — POVIDONE-IODINE 10 % EX SWAB: 2.0000 "application " | Freq: Once | CUTANEOUS | Status: DC

## 2021-06-26 MED ORDER — ALUM & MAG HYDROXIDE-SIMETH 200-200-20 MG/5ML PO SUSP: 30.0000 mL | ORAL | Status: DC | PRN

## 2021-06-26 MED ORDER — KETOROLAC TROMETHAMINE 30 MG/ML IJ SOLN
INTRAMUSCULAR | Status: AC
  Filled 2021-06-26: qty 1

## 2021-06-26 MED ORDER — DIPHENHYDRAMINE HCL 12.5 MG/5ML PO ELIX: 12.5000 mg | ORAL_SOLUTION | ORAL | Status: DC | PRN

## 2021-06-26 MED ORDER — OXYCODONE HCL 5 MG/5ML PO SOLN: 5.0000 mg | Freq: Once | ORAL | Status: DC | PRN

## 2021-06-26 MED ORDER — ONDANSETRON HCL 4 MG/2ML IJ SOLN: 4.0000 mg | Freq: Once | INTRAMUSCULAR | Status: DC | PRN

## 2021-06-26 MED ORDER — CEFAZOLIN SODIUM-DEXTROSE 2-4 GM/100ML-% IV SOLN
2.0000 g | INTRAVENOUS | Status: AC
  Administered 2021-06-26: 2 g via INTRAVENOUS
  Filled 2021-06-26: qty 100

## 2021-06-26 MED ORDER — MENTHOL 3 MG MT LOZG: 1.0000 | LOZENGE | OROMUCOSAL | Status: DC | PRN

## 2021-06-26 MED ORDER — MIDAZOLAM HCL 2 MG/2ML IJ SOLN
INTRAMUSCULAR | Status: AC
  Filled 2021-06-26: qty 2

## 2021-06-26 MED ORDER — OXYCODONE HCL 5 MG PO TABS: 5.0000 mg | ORAL_TABLET | Freq: Once | ORAL | Status: DC | PRN

## 2021-06-26 MED ORDER — SODIUM CHLORIDE 0.9 % IV SOLN: INTRAVENOUS | Status: DC

## 2021-06-26 MED ORDER — ONDANSETRON HCL 4 MG PO TABS: 4.0000 mg | ORAL_TABLET | Freq: Four times a day (QID) | ORAL | Status: DC | PRN

## 2021-06-26 MED ORDER — LACTATED RINGERS IV SOLN: INTRAVENOUS | Status: DC

## 2021-06-26 MED ORDER — ASPIRIN 81 MG PO CHEW
81.0000 mg | CHEWABLE_TABLET | Freq: Two times a day (BID) | ORAL | Status: DC
  Administered 2021-06-26 – 2021-06-27 (×2): 81 mg via ORAL
  Filled 2021-06-26 (×2): qty 1

## 2021-06-26 MED ORDER — FENTANYL CITRATE (PF) 100 MCG/2ML IJ SOLN
INTRAMUSCULAR | Status: DC | PRN
  Administered 2021-06-26: 50 ug via INTRAVENOUS

## 2021-06-26 MED ORDER — POVIDONE-IODINE 10 % EX SWAB
2.0000 | Freq: Once | CUTANEOUS | Status: AC
Start: 2021-06-26 — End: 2021-06-26
  Administered 2021-06-26: 2 via TOPICAL

## 2021-06-26 MED ORDER — PROPOFOL 10 MG/ML IV BOLUS
INTRAVENOUS | Status: DC | PRN
Start: 2021-06-26 — End: 2021-06-26
  Administered 2021-06-26: 25 mg via INTRAVENOUS

## 2021-06-26 MED ORDER — SENNA 8.6 MG PO TABS
1.0000 | ORAL_TABLET | Freq: Two times a day (BID) | ORAL | Status: DC
  Filled 2021-06-26 (×2): qty 1

## 2021-06-26 MED ORDER — DOCUSATE SODIUM 100 MG PO CAPS
100.0000 mg | ORAL_CAPSULE | Freq: Two times a day (BID) | ORAL | Status: DC
  Administered 2021-06-26 – 2021-06-27 (×2): 100 mg via ORAL
  Filled 2021-06-26 (×2): qty 1

## 2021-06-26 MED ORDER — AMLODIPINE BESYLATE 10 MG PO TABS
10.0000 mg | ORAL_TABLET | Freq: Every day | ORAL | Status: DC
  Administered 2021-06-27: 10 mg via ORAL
  Filled 2021-06-26: qty 1

## 2021-06-26 MED ORDER — MIDAZOLAM HCL 5 MG/5ML IJ SOLN
INTRAMUSCULAR | Status: DC | PRN
  Administered 2021-06-26: 2 mg via INTRAVENOUS

## 2021-06-26 MED ORDER — PROPOFOL 1000 MG/100ML IV EMUL
INTRAVENOUS | Status: AC
  Filled 2021-06-26: qty 100

## 2021-06-26 MED ORDER — WATER FOR IRRIGATION, STERILE IR SOLN
Status: DC | PRN
  Administered 2021-06-26: 2000 mL

## 2021-06-26 MED ORDER — ORAL CARE MOUTH RINSE: 15.0000 mL | Freq: Once | OROMUCOSAL | Status: AC

## 2021-06-26 MED ORDER — HYDROCODONE-ACETAMINOPHEN 5-325 MG PO TABS
1.0000 | ORAL_TABLET | ORAL | Status: DC | PRN
  Administered 2021-06-26 – 2021-06-27 (×4): 2 via ORAL
  Filled 2021-06-26 (×4): qty 2

## 2021-06-26 MED ORDER — FENTANYL CITRATE (PF) 100 MCG/2ML IJ SOLN
INTRAMUSCULAR | Status: AC
  Filled 2021-06-26: qty 2

## 2021-06-26 MED ORDER — DEXAMETHASONE SODIUM PHOSPHATE 10 MG/ML IJ SOLN
INTRAMUSCULAR | Status: DC | PRN
  Administered 2021-06-26: 10 mg via INTRAVENOUS

## 2021-06-26 MED ORDER — ONDANSETRON HCL 4 MG/2ML IJ SOLN: 4.0000 mg | Freq: Four times a day (QID) | INTRAMUSCULAR | Status: DC | PRN

## 2021-06-26 MED ORDER — KETOROLAC TROMETHAMINE 30 MG/ML IJ SOLN
INTRAMUSCULAR | Status: DC | PRN
  Administered 2021-06-26: 30 mg

## 2021-06-26 MED ORDER — INSULIN ASPART 100 UNIT/ML IJ SOLN
0.0000 [IU] | Freq: Every day | INTRAMUSCULAR | Status: DC
  Administered 2021-06-26: 3 [IU] via SUBCUTANEOUS

## 2021-06-26 MED ORDER — SODIUM CHLORIDE (PF) 0.9 % IJ SOLN
INTRAMUSCULAR | Status: DC | PRN
  Administered 2021-06-26: 30 mL

## 2021-06-26 MED ORDER — METHOCARBAMOL 1000 MG/10ML IJ SOLN
500.0000 mg | Freq: Four times a day (QID) | INTRAVENOUS | Status: DC | PRN
  Filled 2021-06-26: qty 5

## 2021-06-26 MED ORDER — METOCLOPRAMIDE HCL 5 MG PO TABS: 5.0000 mg | ORAL_TABLET | Freq: Three times a day (TID) | ORAL | Status: DC | PRN

## 2021-06-26 MED ORDER — 0.9 % SODIUM CHLORIDE (POUR BTL) OPTIME
TOPICAL | Status: DC | PRN
  Administered 2021-06-26: 1000 mL

## 2021-06-26 MED ORDER — ISOPROPYL ALCOHOL 70 % SOLN
Status: DC | PRN
  Administered 2021-06-26: 1 via TOPICAL

## 2021-06-26 MED ORDER — PHENOL 1.4 % MT LIQD: 1.0000 | OROMUCOSAL | Status: DC | PRN

## 2021-06-26 MED ORDER — PHENYLEPHRINE HCL-NACL 20-0.9 MG/250ML-% IV SOLN
INTRAVENOUS | Status: DC | PRN
  Administered 2021-06-26: 25 ug/min via INTRAVENOUS

## 2021-06-26 MED ORDER — ONDANSETRON HCL 4 MG/2ML IJ SOLN
INTRAMUSCULAR | Status: DC | PRN
  Administered 2021-06-26: 4 mg via INTRAVENOUS

## 2021-06-26 MED ORDER — SODIUM CHLORIDE 0.9 % IR SOLN
Status: DC | PRN
Start: 2021-06-26 — End: 2021-06-26
  Administered 2021-06-26: 1
  Administered 2021-06-26: 3000 mL

## 2021-06-26 MED ORDER — PROPOFOL 10 MG/ML IV BOLUS
INTRAVENOUS | Status: AC
  Filled 2021-06-26: qty 20

## 2021-06-26 SURGICAL SUPPLY — 63 items
ADH SKN CLS APL DERMABOND .7 (GAUZE/BANDAGES/DRESSINGS) ×1
APL PRP STRL LF DISP 70% ISPRP (MISCELLANEOUS) ×1
BAG COUNTER SPONGE SURGICOUNT (BAG) ×1 IMPLANT
BAG DECANTER FOR FLEXI CONT (MISCELLANEOUS) IMPLANT
BAG SPEC THK2 15X12 ZIP CLS (MISCELLANEOUS)
BAG SPNG CNTER NS LX DISP (BAG) ×1
BAG ZIPLOCK 12X15 (MISCELLANEOUS) IMPLANT
CHLORAPREP W/TINT 26 (MISCELLANEOUS) ×3 IMPLANT
COVER PERINEAL POST (MISCELLANEOUS) ×3 IMPLANT
COVER SURGICAL LIGHT HANDLE (MISCELLANEOUS) ×3 IMPLANT
DERMABOND ADVANCED (GAUZE/BANDAGES/DRESSINGS) ×1
DERMABOND ADVANCED .7 DNX12 (GAUZE/BANDAGES/DRESSINGS) ×4 IMPLANT
DRAPE IMP U-DRAPE 54X76 (DRAPES) ×3 IMPLANT
DRAPE SHEET LG 3/4 BI-LAMINATE (DRAPES) ×9 IMPLANT
DRAPE STERI IOBAN 125X83 (DRAPES) ×3 IMPLANT
DRAPE U-SHAPE 47X51 STRL (DRAPES) ×6 IMPLANT
DRSG AQUACEL AG ADV 3.5X10 (GAUZE/BANDAGES/DRESSINGS) ×3 IMPLANT
ELECT REM PT RETURN 15FT ADLT (MISCELLANEOUS) ×3 IMPLANT
G7 VIT E NTRL LNR 36 SZG (Miscellaneous) ×1 IMPLANT
GAUZE SPONGE 4X4 12PLY STRL (GAUZE/BANDAGES/DRESSINGS) ×3 IMPLANT
GLOVE BIO SURGEON STRL SZ8.5 (GLOVE) ×6 IMPLANT
GLOVE BIOGEL M 7.0 STRL (GLOVE) ×3 IMPLANT
GLOVE BIOGEL PI IND STRL 7.5 (GLOVE) ×2 IMPLANT
GLOVE BIOGEL PI IND STRL 8 (GLOVE) ×2 IMPLANT
GLOVE BIOGEL PI IND STRL 8.5 (GLOVE) ×2 IMPLANT
GLOVE BIOGEL PI INDICATOR 7.5 (GLOVE) ×1
GLOVE BIOGEL PI INDICATOR 8 (GLOVE) ×1
GLOVE BIOGEL PI INDICATOR 8.5 (GLOVE) ×1
GLOVE SURG LX 7.5 STRW (GLOVE) ×2
GLOVE SURG LX STRL 7.5 STRW (GLOVE) ×4 IMPLANT
GOWN SPEC L3 XXLG W/TWL (GOWN DISPOSABLE) ×6 IMPLANT
HANDPIECE INTERPULSE COAX TIP (DISPOSABLE) ×2
HEAD CERAMIC BIOLOX 36 T1 STD (Head) ×1 IMPLANT
HOLDER FOLEY CATH W/STRAP (MISCELLANEOUS) ×3 IMPLANT
HOOD PEEL AWAY FLYTE STAYCOOL (MISCELLANEOUS) ×9 IMPLANT
KIT TURNOVER KIT A (KITS) ×1 IMPLANT
MANIFOLD NEPTUNE II (INSTRUMENTS) ×3 IMPLANT
MARKER SKIN DUAL TIP RULER LAB (MISCELLANEOUS) ×3 IMPLANT
NDL SAFETY ECLIPSE 18X1.5 (NEEDLE) ×2 IMPLANT
NDL SPNL 18GX3.5 QUINCKE PK (NEEDLE) ×2 IMPLANT
NEEDLE HYPO 18GX1.5 SHARP (NEEDLE) ×2
NEEDLE SPNL 18GX3.5 QUINCKE PK (NEEDLE) ×2 IMPLANT
PACK ANTERIOR HIP CUSTOM (KITS) ×3 IMPLANT
PENCIL SMOKE EVACUATOR (MISCELLANEOUS) IMPLANT
SAW OSC TIP CART 19.5X105X1.3 (SAW) ×3 IMPLANT
SEALER BIPOLAR AQUA 6.0 (INSTRUMENTS) ×3 IMPLANT
SET HNDPC FAN SPRY TIP SCT (DISPOSABLE) ×2 IMPLANT
SHELL ACET G7 4H 58 SZG HIP (Shell) ×1 IMPLANT
SOLUTION PRONTOSAN WOUND 350ML (IRRIGATION / IRRIGATOR) ×3 IMPLANT
SPIKE FLUID TRANSFER (MISCELLANEOUS) ×3 IMPLANT
STAPLER INSORB 30 2030 C-SECTI (MISCELLANEOUS) IMPLANT
STEM FEM CL 119X39.6 SZ17 133D (Stem) ×1 IMPLANT
SUT MNCRL AB 3-0 PS2 18 (SUTURE) ×3 IMPLANT
SUT MON AB 2-0 CT1 36 (SUTURE) ×3 IMPLANT
SUT STRATAFIX PDO 1 14 VIOLET (SUTURE) ×2
SUT STRATFX PDO 1 14 VIOLET (SUTURE) ×1
SUT VIC AB 2-0 CT1 27 (SUTURE)
SUT VIC AB 2-0 CT1 TAPERPNT 27 (SUTURE) IMPLANT
SUTURE STRATFX PDO 1 14 VIOLET (SUTURE) ×2 IMPLANT
SYR 3ML LL SCALE MARK (SYRINGE) ×3 IMPLANT
TRAY FOLEY MTR SLVR 16FR STAT (SET/KITS/TRAYS/PACK) ×1 IMPLANT
TUBE SUCTION HIGH CAP CLEAR NV (SUCTIONS) ×3 IMPLANT
WATER STERILE IRR 1000ML POUR (IV SOLUTION) ×3 IMPLANT

## 2021-06-26 NOTE — Anesthesia Preprocedure Evaluation (Signed)
Anesthesia Evaluation  Patient identified by MRN, date of birth, ID band Patient awake    Reviewed: Allergy & Precautions, NPO status , Patient's Chart, lab work & pertinent test results  Airway Mallampati: III  TM Distance: >3 FB Neck ROM: Full  Mouth opening: Limited Mouth Opening  Dental  (+) Missing,    Pulmonary former smoker,  Quit smoking 2017    Pulmonary exam normal breath sounds clear to auscultation       Cardiovascular hypertension, Pt. on medications Normal cardiovascular exam Rhythm:Regular Rate:Normal  EKG 05/15/21 NSR   Neuro/Psych  Headaches, PSYCHIATRIC DISORDERS Anxiety Depression    GI/Hepatic negative GI ROS, (+)     substance abuse  marijuana use, Occasional marijuana    Endo/Other  diabetes, Type 2, Oral Hypoglycemic Agents  Renal/GU negative Renal ROS  negative genitourinary   Musculoskeletal  (+) Arthritis , Osteoarthritis,    Abdominal (+) + obese,   Peds negative pediatric ROS (+)  Hematology  (+) Blood dyscrasia, anemia ,   Anesthesia Other Findings   Reproductive/Obstetrics negative OB ROS                             Anesthesia Physical Anesthesia Plan  ASA: 3  Anesthesia Plan: Spinal   Post-op Pain Management: Tylenol PO (pre-op)*   Induction: Intravenous  PONV Risk Score and Plan: Dexamethasone, Propofol infusion, TIVA, Treatment may vary due to age or medical condition and Ondansetron  Airway Management Planned: Natural Airway and Simple Face Mask  Additional Equipment: None  Intra-op Plan:   Post-operative Plan:   Informed Consent: I have reviewed the patients History and Physical, chart, labs and discussed the procedure including the risks, benefits and alternatives for the proposed anesthesia with the patient or authorized representative who has indicated his/her understanding and acceptance.       Plan Discussed with:  Anesthesiologist and CRNA  Anesthesia Plan Comments:         Anesthesia Quick Evaluation

## 2021-06-26 NOTE — Interval H&P Note (Signed)
History and Physical Interval Note:  06/26/2021 9:42 AM  James Robinson  has presented today for surgery, with the diagnosis of Left hip osteoarthritis.  The various methods of treatment have been discussed with the patient and family. After consideration of risks, benefits and other options for treatment, the patient has consented to  Procedure(s): TOTAL HIP ARTHROPLASTY ANTERIOR APPROACH (Left) as a surgical intervention.  The patient's history has been reviewed, patient examined, no change in status, stable for surgery.  I have reviewed the patient's chart and labs.  Questions were answered to the patient's satisfaction.     Hilton Cork Zylan Almquist

## 2021-06-26 NOTE — Plan of Care (Signed)
  Problem: Clinical Measurements: Goal: Diagnostic test results will improve Outcome: Progressing   Problem: Clinical Measurements: Goal: Respiratory complications will improve Outcome: Progressing   Problem: Clinical Measurements: Goal: Cardiovascular complication will be avoided Outcome: Progressing   Problem: Coping: Goal: Level of anxiety will decrease Outcome: Progressing   Problem: Elimination: Goal: Will not experience complications related to bowel motility Outcome: Progressing   Problem: Elimination: Goal: Will not experience complications related to urinary retention Outcome: Progressing

## 2021-06-26 NOTE — Transfer of Care (Signed)
Immediate Anesthesia Transfer of Care Note  Patient: James Robinson  Procedure(s) Performed: TOTAL HIP ARTHROPLASTY ANTERIOR APPROACH (Left: Hip)  Patient Location: PACU  Anesthesia Type:MAC and Spinal  Level of Consciousness: drowsy and patient cooperative  Airway & Oxygen Therapy: Patient Spontanous Breathing and Patient connected to face mask oxygen  Post-op Assessment: Report given to RN and Post -op Vital signs reviewed and stable  Post vital signs: Reviewed and stable  Last Vitals:  Vitals Value Taken Time  BP 92/52 06/26/21 1342  Temp    Pulse 59 06/26/21 1344  Resp 12 06/26/21 1344  SpO2 100 % 06/26/21 1344  Vitals shown include unvalidated device data.  Last Pain:  Vitals:   06/26/21 0817  TempSrc:   PainSc: 10-Worst pain ever      Patients Stated Pain Goal: 3 ([id number])  Complications: No notable events documented.

## 2021-06-26 NOTE — Anesthesia Procedure Notes (Addendum)
Spinal  Patient location during procedure: OR Start time: 06/26/2021 10:51 AM End time: 06/26/2021 11:00 AM Reason for block: surgical anesthesia Staffing Performed: resident/CRNA  Anesthesiologist: Merlinda Frederick, MD Resident/CRNA: West Pugh, CRNA Preanesthetic Checklist Completed: patient identified, IV checked, site marked, risks and benefits discussed, surgical consent, monitors and equipment checked, pre-op evaluation and timeout performed Spinal Block Patient position: sitting Prep: DuraPrep and site prepped and draped Patient monitoring: heart rate, continuous pulse ox and blood pressure Approach: midline Location: L3-4 Injection technique: single-shot Needle Needle type: Pencan and Introducer  Needle gauge: 24 G Needle length: 10 cm Assessment Sensory level: T6 Events: CSF return Additional Notes IV functioning, monitors applied to pt. Expiration date of kit checked and confirmed to be in date. Sterile prep and drape, hand hygiene and sterile gloved used. Pt was positioned and spine was prepped in sterile fashion. Skin was anesthetized with lidocaine. Free flow of clear CSF obtained prior to injecting local anesthetic into CSF. Spinal needle aspirated freely following injection. Needle was carefully withdrawn, and pt tolerated procedure well. Loss of motor and sensory on exam post injection. Dr Elgie Congo present for procedure.

## 2021-06-26 NOTE — Anesthesia Procedure Notes (Signed)
Procedure Name: MAC Date/Time: 06/26/2021 11:05 AM Performed by: West Pugh, CRNA Pre-anesthesia Checklist: Patient identified, Emergency Drugs available, Suction available and Patient being monitored Patient Re-evaluated:Patient Re-evaluated prior to induction Oxygen Delivery Method: Simple face mask Preoxygenation: Pre-oxygenation with 100% oxygen Induction Type: IV induction Placement Confirmation: positive ETCO2 and breath sounds checked- equal and bilateral Dental Injury: Teeth and Oropharynx as per pre-operative assessment

## 2021-06-26 NOTE — Plan of Care (Signed)
  Problem: Clinical Measurements: Goal: Ability to maintain clinical measurements within normal limits will improve Outcome: Progressing   Problem: Activity: Goal: Risk for activity intolerance will decrease Outcome: Progressing   Problem: Pain Managment: Goal: General experience of comfort will improve Outcome: Progressing   Problem: Safety: Goal: Ability to remain free from injury will improve Outcome: Progressing   

## 2021-06-26 NOTE — Op Note (Signed)
OPERATIVE REPORT  SURGEON: Rod Can, MD   ASSISTANT: Larene Pickett, PA-C.  PREOPERATIVE DIAGNOSIS: Left hip arthritis.   POSTOPERATIVE DIAGNOSIS: Left hip arthritis.   PROCEDURE: Left total hip arthroplasty, anterior approach.   IMPLANTS: Biomet Taperloc Complete Microplasty stem, size 17 x 119, high offset. Biomet G7 OsseoTi Cup, size 58 mm. Biomet Vivacit-E liner, size 36 mm, G, neutral. Biomet Biolox ceramic head ball, size 36 + 0 mm.  ANESTHESIA:  MAC and Spinal  ESTIMATED BLOOD LOSS:-300 mL    ANTIBIOTICS: 2 g Ancef.  DRAINS: None.  COMPLICATIONS: None.   CONDITION: PACU - hemodynamically stable.   BRIEF CLINICAL NOTE: James Robinson is a 61 y.o. male with a long-standing history of Left hip arthritis. After failing conservative management, the patient was indicated for total hip arthroplasty. The risks, benefits, and alternatives to the procedure were explained, and the patient elected to proceed.  PROCEDURE IN DETAIL: Surgical site was marked by myself in the pre-op holding area. Once inside the operating room, spinal anesthesia was obtained, and a foley catheter was inserted. The patient was then positioned on the Hana table.  All bony prominences were well padded.  The hip was prepped and draped in the normal sterile surgical fashion.  A time-out was called verifying side and site of surgery. The patient received IV antibiotics within 60 minutes of beginning the procedure.   Bikini incision was made, and superficial dissection was performed lateral to the ASIS. The direct anterior approach to the hip was performed through the Hueter interval.  Lateral femoral circumflex vessels were treated with the Auqumantys. The anterior capsule was exposed and an inverted T capsulotomy was made. The femoral neck cut was made to the level of the templated cut.  A corkscrew was placed into the head and the head was removed.  The femoral head was found to have eburnated bone. The  head was passed to the back table and was measured. Pubofemoral ligament was released off of the calcar, taking care to stay on bone. Superior capsule was released from the greater trochanter, taking care to stay lateral to the posterior border of the femoral neck in order to preserve the short external rotators.   Acetabular exposure was achieved, and the pulvinar and labrum were excised. Sequential reaming of the acetabulum was then performed up to a size 57 mm reamer. A 58 mm cup was then opened and impacted into place at approximately 40 degrees of abduction and 20 degrees of anteversion. The final polyethylene liner was impacted into place and acetabular osteophytes were removed.    I then gained femoral exposure taking care to protect the abductors and greater trochanter.  This was performed using standard external rotation, extension, and adduction.  A cookie cutter was used to enter the femoral canal, and then the femoral canal finder was placed.  Sequential broaching was performed up to a size 17.  Calcar planer was used on the femoral neck remnant.  I placed a high offset neck and a trial head ball.  The hip was reduced.  Leg lengths and offset were checked fluoroscopically.  The hip was dislocated and trial components were removed.  The final implants were placed, and the hip was reduced.  Fluoroscopy was used to confirm component position and leg lengths.  At 90 degrees of external rotation and full extension, the hip was stable to an anterior directed force.   The wound was copiously irrigated with Prontosan solution and normal saline using pule lavage.  Marcaine solution was injected into the periarticular soft tissue.  The wound was closed in layers using #1 Vicryl and V-Loc for the fascia, 2-0 Vicryl for the subcutaneous fat, 2-0 Monocryl for the deep dermal layer, 3-0 running Monocryl subcuticular stitch, and Dermabond for the skin.  Once the glue was fully dried, an Aquacell Ag dressing was  applied.  The patient was transported to the recovery room in stable condition.  Sponge, needle, and instrument counts were correct at the end of the case x2.  The patient tolerated the procedure well and there were no known complications.  Please note that a surgical assistant was a medical necessity for this procedure to perform it in a safe and expeditious manner. Assistant was necessary to provide appropriate retraction of vital neurovascular structures, to prevent femoral fracture, and to allow for anatomic placement of the prosthesis.

## 2021-06-26 NOTE — Discharge Instructions (Addendum)
 Dr. Brian Swinteck Joint Replacement Specialist [facility] [address] [place], [place] [address] [phone]   TOTAL HIP REPLACEMENT POSTOPERATIVE DIRECTIONS    Hip Rehabilitation, Guidelines Following Surgery   WEIGHT BEARING Weight bearing as tolerated with assist device (walker, cane, etc) as directed, use it as long as suggested by your surgeon or therapist, typically at least 4-6 weeks.  The results of a hip operation are greatly improved after range of motion and muscle strengthening exercises. Follow all safety measures which are given to protect your hip. If any of these exercises cause increased pain or swelling in your joint, decrease the amount until you are comfortable again. Then slowly increase the exercises. Call your caregiver if you have problems or questions.   HOME CARE INSTRUCTIONS  Most of the following instructions are designed to prevent the dislocation of your new hip.  Remove items at home which could result in a fall. This includes throw rugs or furniture in walking pathways.  Continue medications as instructed at time of discharge. You may have some home medications which will be placed on hold until you complete the course of blood thinner medication. You may start showering once you are discharged home. Do not remove your dressing. Do not put on socks or shoes without following the instructions of your caregivers.   Sit on chairs with arms. Use the chair arms to help push yourself up when arising.  Arrange for the use of a toilet seat elevator so you are not sitting low.  Walk with walker as instructed.  You may resume a sexual relationship in one month or when given the OK by your caregiver.  Use walker as long as suggested by your caregivers.  You may put full weight on your legs and walk as much as is comfortable. Avoid periods of inactivity such as sitting longer than an hour when not asleep. This helps prevent blood  clots.  You may return to work once you are cleared by your surgeon.  Do not drive a car for 6 weeks or until released by your surgeon.  Do not drive while taking narcotics.  Wear elastic stockings for two weeks following surgery during the day but you may remove then at night.  Make sure you keep all of your appointments after your operation with all of your doctors and caregivers. You should call the office at the above phone number and make an appointment for approximately two weeks after the date of your surgery. Please pick up a stool softener and laxative for home use as long as you are requiring pain medications. ICE to the affected hip every three hours for 30 minutes at a time and then as needed for pain and swelling. Continue to use ice on the hip for pain and swelling from surgery. You may notice swelling that will progress down to the foot and ankle.  This is normal after surgery.  Elevate the leg when you are not up walking on it.   It is important for you to complete the blood thinner medication as prescribed by your doctor. Continue to use the breathing machine which will help keep your temperature down.  It is common for your temperature to cycle up and down following surgery, especially at night when you are not up moving around and exerting yourself.  The breathing machine keeps your lungs expanded and your temperature down.  RANGE OF MOTION AND STRENGTHENING EXERCISES  These exercises are designed to help you   keep full movement of your hip joint. Follow your caregiver's or physical therapist's instructions. Perform all exercises about fifteen times, three times per day or as directed. Exercise both hips, even if you have had only one joint replacement. These exercises can be done on a training (exercise) mat, on the floor, on a table or on a bed. Use whatever works the best and is most comfortable for you. Use music or television while you are exercising so that the exercises are a  pleasant break in your day. This will make your life better with the exercises acting as a break in routine you can look forward to.  ?Lying on your back, slowly slide your foot toward your buttocks, raising your knee up off the floor. Then slowly slide your foot back down until your leg is straight again.  ?Lying on your back spread your legs as far apart as you can without causing discomfort.  ?Lying on your side, raise your upper leg and foot straight up from the floor as far as is comfortable. Slowly lower the leg and repeat.  ?Lying on your back, tighten up the muscle in the front of your thigh (quadriceps muscles). You can do this by keeping your leg straight and trying to raise your heel off the floor. This helps strengthen the largest muscle supporting your knee.  ?Lying on your back, tighten up the muscles of your buttocks both with the legs straight and with the knee bent at a comfortable angle while keeping your heel on the floor.  ? ?SKILLED REHAB INSTRUCTIONS: ?If the patient is transferred to a skilled rehab facility following release from the hospital, a list of the current medications will be sent to the facility for the patient to continue.  When discharged from the skilled rehab facility, please have the facility set up the patient's Home Health Physical Therapy prior to being released. Also, the skilled facility will be responsible for providing the patient with their medications at time of release from the facility to include their pain medication and their blood thinner medication. If the patient is still at the rehab facility at time of the two week follow up appointment, the skilled rehab facility will also need to assist the patient in arranging follow up appointment in our office and any transportation needs. ? ?POST-OPERATIVE OPIOID TAPER INSTRUCTIONS: ?It is important to wean off of your opioid medication as soon as possible. If you do not need pain medication after your surgery it is ok  to stop day one. ?Opioids include: ?Codeine, Hydrocodone(Norco, Vicodin), Oxycodone(Percocet, oxycontin) and hydromorphone amongst others.  ?Long term and even short term use of opiods can cause: ?Increased pain response ?Dependence ?Constipation ?Depression ?Respiratory depression ?And more.  ?Withdrawal symptoms can include ?Flu like symptoms ?Nausea, vomiting ?And more ?Techniques to manage these symptoms ?Hydrate well ?Eat regular healthy meals ?Stay active ?Use relaxation techniques(deep breathing, meditating, yoga) ?Do Not substitute Alcohol to help with tapering ?If you have been on opioids for less than two weeks and do not have pain than it is ok to stop all together.  ?Plan to wean off of opioids ?This plan should start within one week post op of your joint replacement. ?Maintain the same interval or time between taking each dose and first decrease the dose.  ?Cut the total daily intake of opioids by one tablet each day ?Next start to increase the time between doses. ?The last dose that should be eliminated is the evening dose.  ? ? ?MAKE   SURE YOU:  Understand these instructions.  Will watch your condition.  Will get help right away if you are not doing well or get worse.  Pick up stool softner and laxative for home use following surgery while on pain medications. Do not remove your dressing. The dressing is waterproof--it is OK to take showers. Continue to use ice for pain and swelling after surgery. Do not use any lotions or creams on the incision until instructed by your surgeon. Total Hip Protocol. Please do 150-300 standing side leg raises per day for hip abductor strengthening.

## 2021-06-27 ENCOUNTER — Encounter (HOSPITAL_COMMUNITY): Payer: Self-pay | Admitting: Orthopedic Surgery

## 2021-06-27 DIAGNOSIS — M1612 Unilateral primary osteoarthritis, left hip: Secondary | ICD-10-CM | POA: Diagnosis not present

## 2021-06-27 LAB — GLUCOSE, CAPILLARY: Glucose-Capillary: 280 mg/dL — ABNORMAL HIGH (ref 70–99)

## 2021-06-27 LAB — BASIC METABOLIC PANEL
Anion gap: 7 (ref 5–15)
BUN: 15 mg/dL (ref 6–20)
CO2: 21 mmol/L — ABNORMAL LOW (ref 22–32)
Calcium: 7.7 mg/dL — ABNORMAL LOW (ref 8.9–10.3)
Chloride: 106 mmol/L (ref 98–111)
Creatinine, Ser: 0.96 mg/dL (ref 0.61–1.24)
GFR, Estimated: >60 mL/min (ref >60)
Glucose, Bld: 270 mg/dL — ABNORMAL HIGH (ref 70–99)
Potassium: 3.9 mmol/L (ref 3.5–5.1)
Sodium: 134 mmol/L — ABNORMAL LOW (ref 135–145)

## 2021-06-27 LAB — CBC
HCT: 27.7 % — ABNORMAL LOW (ref 39.0–52.0)
Hemoglobin: 9.8 g/dL — ABNORMAL LOW (ref 13.0–17.0)
MCH: 32.7 pg (ref 26.0–34.0)
MCHC: 35.4 g/dL (ref 30.0–36.0)
MCV: 92.3 fL (ref 80.0–100.0)
Platelets: 225 10*3/uL (ref 150–400)
RBC: 3 MIL/uL — ABNORMAL LOW (ref 4.22–5.81)
RDW: 12.6 % (ref 11.5–15.5)
WBC: 12.1 10*3/uL — ABNORMAL HIGH (ref 4.0–10.5)
nRBC: 0 % (ref 0.0–0.2)

## 2021-06-27 MED ORDER — ONDANSETRON HCL 4 MG PO TABS: 4.0000 mg | ORAL_TABLET | Freq: Three times a day (TID) | ORAL | 0 refills | Status: AC | PRN

## 2021-06-27 MED ORDER — SENNA 8.6 MG PO TABS: 2.0000 | ORAL_TABLET | Freq: Every day | ORAL | 0 refills | Status: AC

## 2021-06-27 MED ORDER — ASPIRIN 81 MG PO CHEW: 81.0000 mg | CHEWABLE_TABLET | Freq: Two times a day (BID) | ORAL | 0 refills | Status: AC

## 2021-06-27 MED ORDER — DOCUSATE SODIUM 100 MG PO CAPS: 100.0000 mg | ORAL_CAPSULE | Freq: Two times a day (BID) | ORAL | 0 refills | Status: AC

## 2021-06-27 MED ORDER — POLYETHYLENE GLYCOL 3350 17 G PO PACK: 17.0000 g | PACK | Freq: Every day | ORAL | 0 refills | Status: AC | PRN

## 2021-06-27 MED ORDER — HYDROCODONE-ACETAMINOPHEN 10-325 MG PO TABS: 0.5000 | ORAL_TABLET | ORAL | 0 refills | Status: AC | PRN

## 2021-06-27 MED ORDER — METHOCARBAMOL 500 MG PO TABS
500.0000 mg | ORAL_TABLET | Freq: Four times a day (QID) | ORAL | 0 refills | Status: DC | PRN
Start: 2021-06-27 — End: 2023-01-26

## 2021-06-27 NOTE — Progress Notes (Signed)
Pt is ready for discharge.  PIV removed.  Remains alert and oriented.  Friend at bedside.  AVS given.  Pt able to verbalize understanding of all discharge instructions, including wound care.  All questions answered.

## 2021-06-27 NOTE — Evaluation (Signed)
Physical Therapy Evaluation Patient Details Name: James Robinson MRN: [id number] DOB: [birth year] Today's Date: 06/27/2021  History of Present Illness  Pt s/p L THR and with hx of R TKR and DM  Clinical Impression  Pt s/p L THR and presents with decreased L LE strength/ROM and post op pain limiting functional mobility.  Pt should progress to dc home with family assist.     Recommendations for follow up therapy are one component of a multi-disciplinary discharge planning process, led by the attending physician.  Recommendations may be updated based on patient status, additional functional criteria and insurance authorization.  Follow Up Recommendations Follow physician's recommendations for discharge plan and follow up therapies    Assistance Recommended at Discharge Intermittent Supervision/Assistance  Patient can return home with the following  A little help with walking and/or transfers;A little help with bathing/dressing/bathroom;Assistance with cooking/housework;Assist for transportation;Help with stairs or ramp for entrance    Equipment Recommendations None recommended by PT  Recommendations for Other Services       Functional Status Assessment Patient has had a recent decline in their functional status and demonstrates the ability to make significant improvements in function in a reasonable and predictable amount of time.     Precautions / Restrictions Precautions Precautions: Fall Restrictions Weight Bearing Restrictions: No Other Position/Activity Restrictions: WBAT      Mobility  Bed Mobility Overal bed mobility: Needs Assistance Bed Mobility: Supine to Sit     Supine to sit: Supervision     General bed mobility comments: Increased time with cues for sequence    Transfers Overall transfer level: Needs assistance Equipment used: Rolling walker (2 wheels) Transfers: Sit to/from Stand Sit to Stand: Min guard           General transfer comment: cues for LE  management and use of UEs to self assist    Ambulation/Gait Ambulation/Gait assistance: Min guard Gait Distance (Feet): 100 Feet Assistive device: Rolling walker (2 wheels) Gait Pattern/deviations: Step-to pattern, Decreased step length - right, Decreased step length - left, Shuffle Gait velocity: decr     General Gait Details: cues for posture, position from RW and sequence  Stairs            Wheelchair Mobility    Modified Rankin (Stroke Patients Only)       Balance Overall balance assessment: Needs assistance Sitting-balance support: No upper extremity supported, Feet supported Sitting balance-Leahy Scale: Good     Standing balance support: No upper extremity supported Standing balance-Leahy Scale: Fair                               Pertinent Vitals/Pain Pain Assessment Pain Assessment: 0-10 Pain Score: 6  Pain Location: L hip Pain Descriptors / Indicators: Aching, Sore Pain Intervention(s): Limited activity within patient's tolerance, Monitored during session, Premedicated before session, Ice applied    Home Living Family/patient expects to be discharged to:: Private residence Living Arrangements: Alone Available Help at Discharge: Family;Available 24 hours/day Type of Home: House Home Access: Level entry       Home Layout: One level Home Equipment: Rollator (4 wheels);Cane - single Librarian, academic (2 wheels) Additional Comments: sister to assist at home    Prior Function Prior Level of Function : Independent/Modified Independent             Mobility Comments: used cane PRN       Hand Dominance  Extremity/Trunk Assessment   Upper Extremity Assessment Upper Extremity Assessment: Overall WFL for tasks assessed    Lower Extremity Assessment Lower Extremity Assessment: LLE deficits/detail LLE Deficits / Details: AAROM at hip to 80 flex and 25 abd; strength at hip 2+/5    Cervical / Trunk Assessment Cervical /  Trunk Assessment: Normal  Communication   Communication: No difficulties  Cognition Arousal/Alertness: Awake/alert Behavior During Therapy: WFL for tasks assessed/performed Overall Cognitive Status: Within Functional Limits for tasks assessed                                          General Comments      Exercises Total Joint Exercises Ankle Circles/Pumps: AROM, Both, 15 reps, Supine Quad Sets: AROM, Both, 10 reps, Supine Heel Slides: AAROM, Left, 20 reps, Supine Hip ABduction/ADduction: AAROM, Left, 15 reps, Supine Long Arc Quad: AAROM, Left, 10 reps, Seated   Assessment/Plan    PT Assessment Patient needs continued PT services  PT Problem List Decreased strength;Decreased range of motion;Decreased activity tolerance;Decreased balance;Decreased mobility;Decreased knowledge of use of DME;Pain       PT Treatment Interventions DME instruction;Gait training;Functional mobility training;Therapeutic activities;Therapeutic exercise;Patient/family education    PT Goals (Current goals can be found in the Care Plan section)  Acute Rehab PT Goals Patient Stated Goal: Regain IND PT Goal Formulation: With patient Time For Goal Achievement: 07/04/21 Potential to Achieve Goals: Good    Frequency 7X/week     Co-evaluation               AM-PAC PT "6 Clicks" Mobility  Outcome Measure Help needed turning from your back to your side while in a flat bed without using bedrails?: A Little Help needed moving from lying on your back to sitting on the side of a flat bed without using bedrails?: A Little Help needed moving to and from a bed to a chair (including a wheelchair)?: A Little Help needed standing up from a chair using your arms (e.g., wheelchair or bedside chair)?: A Little Help needed to walk in hospital room?: A Little Help needed climbing 3-5 steps with a railing? : A Little 6 Click Score: 18    End of Session Equipment Utilized During Treatment: Gait  belt Activity Tolerance: Patient tolerated treatment well Patient left: in chair;with call bell/phone within reach;with nursing/sitter in room Nurse Communication: Mobility status PT Visit Diagnosis: Difficulty in walking, not elsewhere classified (R26.2)    Time: 0817-0900 PT Time Calculation (min) (ACUTE ONLY): 43 min   Charges:   PT Evaluation $PT Eval Low Complexity: 1 Low PT Treatments $Gait Training: 8-22 mins $Therapeutic Exercise: 8-22 mins        Mauro Kaufmann PT Acute Rehabilitation Services Pager [phone] Office [phone]   Cortlynn Hollinsworth 06/27/2021, 9:39 AM

## 2021-06-27 NOTE — Progress Notes (Signed)
    Subjective:  Patient reports pain as mild.  Denies N/V/CP/SOB. Patient is doing really well this morning. He is having slight pain in his anterior distal thigh around his knee. We discussed this is normal from having a THA. He is eager to go home and work with therapy today.   Objective:   VITALS:   Vitals:   06/26/21 1640 06/26/21 2049 06/27/21 0238 06/27/21 0525  BP: 127/68 124/67 123/65 125/64  Pulse: 72 85 84 85  Resp: $Remo'17 17 17 17  'QUUgs$ Temp: 97.8 F (36.6 C) 98 F (36.7 C) 98.7 F (37.1 C) 98.4 F (36.9 C)  TempSrc: Oral Oral Oral Oral  SpO2: 98% 99% 100% 99%  Weight:      Height:        Patient sitting up in bed in NAD. Neurologically intact ABD soft Neurovascular intact Sensation intact distally Intact pulses distally Dorsiflexion/Plantar flexion intact Incision: dressing C/D/I No cellulitis present Compartment soft   Lab Results  Component Value Date   WBC 12.1 (H) 06/27/2021   HGB 9.8 (L) 06/27/2021   HCT 27.7 (L) 06/27/2021   MCV 92.3 06/27/2021   PLT 225 06/27/2021   BMET    Component Value Date/Time   NA 134 (L) 06/27/2021 0341   NA 143 05/15/2021 1012   K 3.9 06/27/2021 0341   CL 106 06/27/2021 0341   CO2 21 (L) 06/27/2021 0341   GLUCOSE 270 (H) 06/27/2021 0341   BUN 15 06/27/2021 0341   BUN 11 05/15/2021 1012   CREATININE 0.96 06/27/2021 0341   CREATININE 0.79 01/01/2014 1027   CALCIUM 7.7 (L) 06/27/2021 0341   EGFR 102 05/15/2021 1012   GFRNONAA >60 06/27/2021 0341   GFRNONAA >89 01/01/2014 1027     Assessment/Plan: 1 Day Post-Op   Principal Problem:   Osteoarthritis of left hip   WBAT with walker DVT ppx: Aspirin, SCDs, TEDS PO pain control. Hydrocodone, methocarbamol, meloxicam.  PT/OT. Has not see yet. They will come by today and evaluate.  Dispo: D/c home with home exercise program once cleared by PT.     Charlott Rakes, PA-C 06/27/2021, 7:33 AM   [facility]  [place] [address], [address], [place],  [place] [address] Phone: [phone] [email or web address] Facebook  [employer]

## 2021-06-27 NOTE — Plan of Care (Signed)

## 2021-06-27 NOTE — Progress Notes (Signed)
Physical Therapy Treatment Patient Details Name: James Robinson MRN: [id number] DOB: [birth year] Today's Date: 06/27/2021   History of Present Illness Pt s/p L THR and with hx of R TKR and DM    PT Comments    Pt motivated to dc this date and ride present.  Pt reviewed car transfers and written HEP including progression to standing therex.  Pt up from recliner to review transfers into bed - most successful with R LE assisting L LE.  Pt eager for dc home.  Recommendations for follow up therapy are one component of a multi-disciplinary discharge planning process, led by the attending physician.  Recommendations may be updated based on patient status, additional functional criteria and insurance authorization.  Follow Up Recommendations  Follow physician's recommendations for discharge plan and follow up therapies     Assistance Recommended at Discharge Intermittent Supervision/Assistance  Patient can return home with the following A little help with walking and/or transfers;A little help with bathing/dressing/bathroom;Assistance with cooking/housework;Assist for transportation;Help with stairs or ramp for entrance   Equipment Recommendations  None recommended by PT    Recommendations for Other Services       Precautions / Restrictions Precautions Precautions: Fall Restrictions Weight Bearing Restrictions: No Other Position/Activity Restrictions: WBAT     Mobility  Bed Mobility Overal bed mobility: Needs Assistance Bed Mobility: Sit to Supine     Supine to sit: Supervision Sit to supine: Min guard, Supervision   General bed mobility comments: Increased time with cues for sequence and use of R LE to assist L LE onto bed    Transfers Overall transfer level: Needs assistance Equipment used: Rolling walker (2 wheels) Transfers: Sit to/from Stand, Bed to chair/wheelchair/BSC Sit to Stand: Supervision           General transfer comment: cues for LE management and use of  UEs to self assist    Ambulation/Gait Ambulation/Gait assistance: Supervision Gait Distance (Feet): 5 Feet Assistive device: Rolling walker (2 wheels) Gait Pattern/deviations: Step-to pattern, Decreased step length - right, Decreased step length - left, Shuffle Gait velocity: decr     General Gait Details: cues for posture, position from RW and sequence   Stairs             Wheelchair Mobility    Modified Rankin (Stroke Patients Only)       Balance Overall balance assessment: Needs assistance Sitting-balance support: No upper extremity supported, Feet supported Sitting balance-Leahy Scale: Good     Standing balance support: No upper extremity supported Standing balance-Leahy Scale: Fair                              Cognition Arousal/Alertness: Awake/alert Behavior During Therapy: WFL for tasks assessed/performed Overall Cognitive Status: Within Functional Limits for tasks assessed                                          Exercises Total Joint Exercises Ankle Circles/Pumps: AROM, Both, 15 reps, Supine Quad Sets: AROM, Both, 10 reps, Supine Heel Slides: AAROM, Left, 20 reps, Supine Hip ABduction/ADduction: AAROM, Left, 15 reps, Supine Long Arc Quad: AAROM, Left, 10 reps, Seated    General Comments        Pertinent Vitals/Pain Pain Assessment Pain Assessment: 0-10 Pain Score: 5  Pain Location: L hip Pain Descriptors / Indicators: Aching, Sore Pain Intervention(s):  Limited activity within patient's tolerance, Monitored during session, Premedicated before session, Ice applied    Home Living Family/patient expects to be discharged to:: Private residence Living Arrangements: Alone Available Help at Discharge: Family;Available 24 hours/day Type of Home: House Home Access: Level entry       Home Layout: One level Home Equipment: Rollator (4 wheels);Cane - single Librarian, academic (2 wheels) Additional Comments: sister  to assist at home    Prior Function            PT Goals (current goals can now be found in the care plan section) Acute Rehab PT Goals Patient Stated Goal: Regain IND PT Goal Formulation: With patient Time For Goal Achievement: 07/04/21 Potential to Achieve Goals: Good Progress towards PT goals: Progressing toward goals    Frequency    7X/week      PT Plan Current plan remains appropriate    Co-evaluation              AM-PAC PT "6 Clicks" Mobility   Outcome Measure  Help needed turning from your back to your side while in a flat bed without using bedrails?: A Little Help needed moving from lying on your back to sitting on the side of a flat bed without using bedrails?: A Little Help needed moving to and from a bed to a chair (including a wheelchair)?: A Little Help needed standing up from a chair using your arms (e.g., wheelchair or bedside chair)?: A Little Help needed to walk in hospital room?: A Little Help needed climbing 3-5 steps with a railing? : A Little 6 Click Score: 18    End of Session Equipment Utilized During Treatment: Gait belt Activity Tolerance: Patient tolerated treatment well Patient left: in bed;with call bell/phone within reach;with family/visitor present Nurse Communication: Mobility status PT Visit Diagnosis: Difficulty in walking, not elsewhere classified (R26.2)     Time: 1110-1131 PT Time Calculation (min) (ACUTE ONLY): 21 min  Charges:  $Gait Training: 8-22 mins $Therapeutic Exercise: 8-22 mins $Therapeutic Activity: 8-22 mins                     Mauro Kaufmann PT Acute Rehabilitation Services Pager [phone] Office [phone]    James Robinson 06/27/2021, 11:37 AM

## 2021-06-27 NOTE — Anesthesia Postprocedure Evaluation (Signed)
Anesthesia Post Note  Patient: James Robinson  Procedure(s) Performed: TOTAL HIP ARTHROPLASTY ANTERIOR APPROACH (Left: Hip)     Patient location during evaluation: Nursing Unit Anesthesia Type: Spinal Level of consciousness: oriented and awake and alert Pain management: pain level controlled Vital Signs Assessment: post-procedure vital signs reviewed and stable Respiratory status: spontaneous breathing and respiratory function stable Cardiovascular status: blood pressure returned to baseline and stable Postop Assessment: no headache, no backache, no apparent nausea or vomiting and patient able to bend at knees Anesthetic complications: no   No notable events documented.  Last Vitals:  Vitals:   06/27/21 0525 06/27/21 0920  BP: 125/64 119/66  Pulse: 85 82  Resp: 17 14  Temp: 36.9 C 36.7 C  SpO2: 99% 98%    Last Pain:  Vitals:   06/27/21 0920  TempSrc: Oral  PainSc:                  Mellody Dance

## 2021-06-27 NOTE — TOC Transition Note (Signed)
Transition of Care [facility]) - CM/SW Discharge Note  Patient Details  Name: James Robinson MRN: [id number] Date of Birth: May 10, 1960  Transition of Care [facility]) CM/SW Contact:  Sherie Don, LCSW Phone Number: 06/27/2021, 10:52 AM  Clinical Narrative: Patient is expected to discharge home after working with PT. CSW met with patient to confirm discharge plan. Patient will go home with a home exercise program (HEP). Patient has a rolling walker and rollator at home, so there are no DME needs at this time. TOC signing off.  Final next level of care: Home/Self Care Barriers to Discharge: No Barriers Identified  Patient Goals and CMS Choice Patient states their goals for this hospitalization and ongoing recovery are:: Discharge home with HEP Choice offered to / list presented to : NA  Discharge Plan and Services       DME Arranged: N/A DME Agency: NA  Readmission Risk Interventions     View : No data to display.

## 2021-08-17 ENCOUNTER — Ambulatory Visit: Admission: EM | Admit: 2021-08-17 | Discharge: 2021-08-17 | Payer: Commercial Managed Care - HMO

## 2021-08-17 DIAGNOSIS — M79605 Pain in left leg: Secondary | ICD-10-CM

## 2021-08-17 NOTE — ED Provider Notes (Signed)
[facility]    CSN: [id number] Arrival date & time: 08/17/21  1118      History   Chief Complaint Chief Complaint  Patient presents with   Thigh Pain    HPI James Robinson is a 61 y.o. male.   Pt complains of pain in his right thigh.  Pt report pain for 2 days.  Pt had a hip replacement by Dr. Lillia Corporal  2 months ago.  Pt reports he feel off a chair a week ago. Pt saw Dr. Lyla Glassing and had xrays and was told hip replacement looks good.  No injury from fall,  Pt complains of pain in his inner thigh.   The history is provided by the patient. No language interpreter was used.    Past Medical History:  Diagnosis Date   Anxiety    Arthritis    Depression    Diabetes mellitus without complication ([place])    Hyperlipidemia    Hypertension Dx 2008   on BP medicine for a brief period     Patient Active Problem List   Diagnosis Date Noted   Osteoarthritis of left hip 06/26/2021   OA (osteoarthritis) of knee 12/02/2020   Primary osteoarthritis of right knee 12/02/2020   Enlarged prostate on rectal examination 01/01/2014   Current smoker 01/01/2014   Rectal bleeding 01/01/2014   SHOULDER PAIN, BILATERAL 04/13/2008   KNEE PAIN, RIGHT 04/13/2008   HEMORRHOIDS, INTERNAL 08/04/2007   Migraine headache 01/19/2007   H/O: HTN (hypertension) 01/19/2007    Past Surgical History:  Procedure Laterality Date   NO PAST SURGERIES     TOTAL HIP ARTHROPLASTY Left 06/26/2021   Procedure: TOTAL HIP ARTHROPLASTY ANTERIOR APPROACH;  Surgeon: Rod Can, MD;  Location: [facility];  Service: Orthopedics;  Laterality: Left;   TOTAL KNEE ARTHROPLASTY Right 12/02/2020   Procedure: TOTAL KNEE ARTHROPLASTY;  Surgeon: Gaynelle Arabian, MD;  Location: [facility];  Service: Orthopedics;  Laterality: Right;       Home Medications    Prior to Admission medications   Medication Sig Start Date End Date Taking? Authorizing Provider  amLODipine (NORVASC) 10 MG tablet Take 1 tablet (10 mg total)  by mouth daily. 04/01/21   Kerin Perna, NP  blood glucose meter kit and supplies Dispense based on patient and insurance preference. Use up to four times daily as directed. (FOR ICD-10 E10.9, E11.9). 01/29/21   Kerin Perna, NP  gabapentin (NEURONTIN) 300 MG capsule Take a 300 mg capsule three times a day for two weeks following surgery.Then take a 300 mg capsule two times a day for two weeks. Then take a 300 mg capsule once a day for two weeks. Then discontinue. 12/03/20   Edmisten, Kristie L, PA  lisinopril (ZESTRIL) 20 MG tablet Take 1 tablet (20 mg total) by mouth daily. 04/01/21   Kerin Perna, NP  metFORMIN (GLUCOPHAGE XR) 500 MG 24 hr tablet Take 1 tablet twice a day for 1 week. If no GI symptoms week 2 take 2 tablets twice a day Patient taking differently: 1,000 mg 2 (two) times daily. 04/01/21   Kerin Perna, NP  methocarbamol (ROBAXIN) 500 MG tablet Take 1 tablet (500 mg total) by mouth every 6 (six) hours as needed for muscle spasms. 06/27/21   Hill, Marciano Sequin, PA-C  naproxen (NAPROSYN) 250 MG tablet Take 1,000 mg by mouth daily as needed for mild pain or moderate pain.    [provider]  ondansetron (ZOFRAN) 4 MG tablet Take 1  tablet (4 mg total) by mouth every 8 (eight) hours as needed for nausea or vomiting. 06/27/21 06/27/22  Charlott Rakes, PA-C    Family History Family History  Problem Relation Age of Onset   Glaucoma Mother    Diabetes Neg Hx    Heart disease Neg Hx    Cancer Neg Hx    Colon polyps Neg Hx    Colon cancer Neg Hx    Esophageal cancer Neg Hx    Ulcerative colitis Neg Hx    Stomach cancer Neg Hx     Social History Social History   Tobacco Use   Smoking status: Former    Packs/day: 0.50    Years: 27.00    Total pack years: 13.50    Types: Cigarettes    Quit date: 07/23/2015    Years since quitting: 6.0   Smokeless tobacco: Never  Vaping Use   Vaping Use: Never used  Substance Use Topics   Alcohol use: Yes    Comment:  occasional   Drug use: Yes    Types: Marijuana    Comment: uses on 2 x per week     Allergies   Patient has no known allergies.   Review of Systems Review of Systems  All other systems reviewed and are negative.    Physical Exam Triage Vital Signs ED Triage Vitals  Enc Vitals Group     BP 08/17/21 1129 (!) 142/77     Pulse Rate 08/17/21 1129 91     Resp 08/17/21 1129 18     Temp 08/17/21 1129 97.6 F (36.4 C)     Temp Source 08/17/21 1129 Oral     SpO2 08/17/21 1129 95 %     Weight --      Height --      Head Circumference --      Peak Flow --      Pain Score 08/17/21 1133 9     Pain Loc --      Pain Edu? --      Excl. in [place]? --    No data found.  Updated Vital Signs BP (!) 142/77 (BP Location: Left Arm)   Pulse 91   Temp 97.6 F (36.4 C) (Oral)   Resp 18   SpO2 95%   Visual Acuity Right Eye Distance:   Left Eye Distance:   Bilateral Distance:    Right Eye Near:   Left Eye Near:    Bilateral Near:     Physical Exam Vitals and nursing note reviewed.  Constitutional:      Appearance: He is well-developed.  HENT:     Head: Normocephalic.  Pulmonary:     Effort: Pulmonary effort is normal.  Abdominal:     General: There is no distension.  Musculoskeletal:        General: Swelling and tenderness present.     Cervical back: Normal range of motion.     Comments: Tender left inner thigh, slight swelling,  calf nontender   Neurological:     Mental Status: He is alert and oriented to person, place, and time.  Psychiatric:        Mood and Affect: Mood normal.      UC Treatments / Results  Labs (all labs ordered are listed, but only abnormal results are displayed) Labs Reviewed - No data to display  EKG   Radiology No results found.  Procedures Procedures (including critical care time)  Medications Ordered in UC Medications - No  data to display  Initial Impression / Assessment and Plan / UC Course  I have reviewed the triage vital  signs and the nursing notes.  Pertinent labs & imaging results that were available during my care of the patient were reviewed by me and considered in my medical decision making (see chart for details).     Pt advised to go to Ed for ultrasound evaluation  Final Clinical Impressions(s) / UC Diagnoses   Final diagnoses:  Acute leg pain, left     Discharge Instructions      Go to [facility] to be seen for evaltuion   ED Prescriptions   None    PDMP not reviewed this encounter.   Fransico Meadow, [place] 08/17/21 1243

## 2021-08-17 NOTE — ED Notes (Signed)
Patient is being discharged from the Urgent Care and sent to the Emergency Department via personal vehicle . Per Provider Langston Masker, patient is in need of higher level of care due to pain & swelling in thigh after fall with hip replacement. Patient is aware and verbalizes understanding of plan of care.   Vitals:   08/17/21 1129  BP: (!) 142/77  Pulse: 91  Resp: 18  Temp: 97.6 F (36.4 C)  SpO2: 95%

## 2021-08-17 NOTE — Discharge Instructions (Signed)
Go to [facility] to be seen for evaltuion

## 2021-08-17 NOTE — ED Triage Notes (Signed)
Pt presents with left thigh pain X 2 days; pt states he recently had hip replacement 2 months ago and had a fall out of his computer chair 2 days ago.

## 2021-08-19 ENCOUNTER — Other Ambulatory Visit: Payer: Self-pay | Admitting: Orthopedic Surgery

## 2021-08-19 DIAGNOSIS — M25552 Pain in left hip: Secondary | ICD-10-CM

## 2021-09-16 ENCOUNTER — Ambulatory Visit
Admission: RE | Admit: 2021-09-16 | Discharge: 2021-09-16 | Disposition: A | Discharge status: Home / Self Care | Payer: Commercial Managed Care - HMO | Source: Ambulatory Visit | Attending: Orthopedic Surgery | Admitting: Orthopedic Surgery

## 2021-09-16 DIAGNOSIS — M25552 Pain in left hip: Secondary | ICD-10-CM

## 2021-10-16 ENCOUNTER — Other Ambulatory Visit (INDEPENDENT_AMBULATORY_CARE_PROVIDER_SITE_OTHER): Payer: Self-pay | Admitting: Primary Care

## 2021-10-16 DIAGNOSIS — I10 Essential (primary) hypertension: Secondary | ICD-10-CM

## 2021-10-16 DIAGNOSIS — Z760 Encounter for issue of repeat prescription: Secondary | ICD-10-CM

## 2021-10-16 NOTE — Telephone Encounter (Signed)
Medication Refill - Medication: amLODipine (NORVASC) 10 MG tablet  Has the patient contacted their pharmacy? No.   Preferred Pharmacy (with phone number or street name):  [facility] [address] [place]), [place] - [id number] Phone:  [phone]  Fax:  [phone]     Has the patient been seen for an appointment in the last year OR does the patient have an upcoming appointment? Yes.    Agent: Please be advised that RX refills may take up to 3 business days. We ask that you follow-up with your pharmacy.

## 2021-10-20 MED ORDER — AMLODIPINE BESYLATE 10 MG PO TABS: 10.0000 mg | ORAL_TABLET | Freq: Every day | ORAL | 0 refills | Status: DC

## 2021-10-20 NOTE — Telephone Encounter (Signed)
Requested Prescriptions  Pending Prescriptions Disp Refills  . amLODipine (NORVASC) 10 MG tablet 90 tablet 0    Sig: Take 1 tablet (10 mg total) by mouth daily.     Cardiovascular: Calcium Channel Blockers 2 Failed - 10/16/2021  4:36 PM      Failed - Last BP in normal range    BP Readings from Last 1 Encounters:  08/17/21 (!) 142/77         Passed - Last Heart Rate in normal range    Pulse Readings from Last 1 Encounters:  08/17/21 91         Passed - Valid encounter within last 6 months    Recent Outpatient Visits          5 months ago Acute hip pain, left   [facility] Gwinda Passe P, NP   5 months ago Type 2 diabetes mellitus without complication, without long-term current use of insulin (HCC)   [facility] Grayce Sessions, NP   6 months ago Medication refill   [facility] Gwinda Passe P, NP   6 months ago Type 2 diabetes mellitus without complication, without long-term current use of insulin [facility])   [facility] Zuehl, Jeannett Senior L, RPH-CPP   9 months ago Screening for diabetes mellitus   [facility] Grayce Sessions, NP      Future Appointments            In 3 weeks Randa Evens Kinnie Scales, NP [facility]

## 2021-11-14 ENCOUNTER — Ambulatory Visit (INDEPENDENT_AMBULATORY_CARE_PROVIDER_SITE_OTHER): Payer: Managed Care, Other (non HMO) | Admitting: Primary Care

## 2021-12-02 ENCOUNTER — Ambulatory Visit (INDEPENDENT_AMBULATORY_CARE_PROVIDER_SITE_OTHER): Payer: Managed Care, Other (non HMO) | Admitting: Primary Care

## 2022-05-03 ENCOUNTER — Other Ambulatory Visit (INDEPENDENT_AMBULATORY_CARE_PROVIDER_SITE_OTHER): Payer: Self-pay | Admitting: Primary Care

## 2022-05-03 DIAGNOSIS — I10 Essential (primary) hypertension: Secondary | ICD-10-CM

## 2022-05-03 DIAGNOSIS — Z760 Encounter for issue of repeat prescription: Secondary | ICD-10-CM

## 2022-05-05 NOTE — Telephone Encounter (Signed)
Pt called and scheduled an appt, he is asking for a refill to last him until his appt.

## 2022-05-20 ENCOUNTER — Encounter (INDEPENDENT_AMBULATORY_CARE_PROVIDER_SITE_OTHER): Payer: Self-pay | Admitting: Primary Care

## 2022-05-20 ENCOUNTER — Ambulatory Visit (INDEPENDENT_AMBULATORY_CARE_PROVIDER_SITE_OTHER): Payer: Medicaid Other | Admitting: Primary Care

## 2022-05-20 VITALS — BP 111/57 | HR 77 | Temp 98.1°F | Resp 20 | Ht 71.0 in | Wt 229.0 lb

## 2022-05-20 DIAGNOSIS — M549 Dorsalgia, unspecified: Secondary | ICD-10-CM | POA: Diagnosis not present

## 2022-05-20 DIAGNOSIS — Z760 Encounter for issue of repeat prescription: Secondary | ICD-10-CM

## 2022-05-20 DIAGNOSIS — Z1211 Encounter for screening for malignant neoplasm of colon: Secondary | ICD-10-CM

## 2022-05-20 DIAGNOSIS — I10 Essential (primary) hypertension: Secondary | ICD-10-CM

## 2022-05-20 DIAGNOSIS — M545 Low back pain, unspecified: Secondary | ICD-10-CM

## 2022-05-20 DIAGNOSIS — E119 Type 2 diabetes mellitus without complications: Secondary | ICD-10-CM

## 2022-05-20 LAB — POCT GLYCOSYLATED HEMOGLOBIN (HGB A1C): HbA1c, POC (prediabetic range): 6.4 % (ref 5.7–6.4)

## 2022-05-20 LAB — GLUCOSE, POCT (MANUAL RESULT ENTRY): POC Glucose: 194 mg/dl — AB (ref 70–99)

## 2022-05-20 LAB — POCT URINALYSIS DIP (CLINITEK)
Bilirubin, UA: NEGATIVE
Blood, UA: NEGATIVE
Glucose, UA: NEGATIVE mg/dL
Ketones, POC UA: NEGATIVE mg/dL
Leukocytes, UA: NEGATIVE
Nitrite, UA: NEGATIVE
POC PROTEIN,UA: 100 — AB
Spec Grav, UA: >=1.03 — AB (ref 1.010–1.025)
Urobilinogen, UA: 0.2 E.U./dL
pH, UA: 6 (ref 5.0–8.0)

## 2022-05-20 MED ORDER — IBUPROFEN 600 MG PO TABS: 600.0000 mg | ORAL_TABLET | Freq: Three times a day (TID) | ORAL | 0 refills | Status: DC | PRN

## 2022-05-20 MED ORDER — METFORMIN HCL ER 500 MG PO TB24: ORAL_TABLET | ORAL | 1 refills | Status: DC

## 2022-05-20 MED ORDER — CETIRIZINE HCL 10 MG PO TABS: 10.0000 mg | ORAL_TABLET | Freq: Every day | ORAL | 0 refills | Status: DC

## 2022-05-20 NOTE — Progress Notes (Signed)
Helped friend move  a refrigerator 3 weeks ago. Had some back spasms discomforts. Had really bad pain onset and has had no pain relief with meds and heat pad.  Taken Naproxen and muscle relaxer 1 episode of nause and vomiting  Sinuses: runny nose

## 2022-05-20 NOTE — Progress Notes (Signed)
[facility]  James Robinson, is a 62 y.o. male  WGY:[id number]  XBL:[id number]  DOB - 07-21-1960  Chief Complaint  Patient presents with   Diabetes   Hypertension       Subjective:   James Robinson is a 62 y.o. obese male here today for a follow up visit for HTN. - well controlled .Patient has No headache, No chest pain, No abdominal pain - No Nausea, No new weakness tingling or numbness, No Cough - shortness of breath. T2D- denies polyuria , polyphagia , polydipsia or vision changes. His major concern is low back pain left side help a friend move a refrigerator mild pain later 3 weeks now back pain is 10/10 states came back with a vengeance. Today , he is using a cane for gait suability.  Aggravating factors: certain movements and prolonged walking/standing. Alleviating factors: rest. Progressive LE weakness or saddle anesthesia: none. Extremity sensation changes or weakness: none. Ambulatory with difficulty. Normal bowel/bladder habits: yes; without urinary retention. Normal PO intake without n/v. No associated abdominal pain/n/v. Self treatment: has OTC analgesics, with minimal relief    No problems updated.  No Known Allergies  Past Medical History:  Diagnosis Date   Anxiety    Arthritis    Depression    Diabetes mellitus without complication    Hyperlipidemia    Hypertension Dx 2008   on BP medicine for a brief period     Current Outpatient Medications on File Prior to Visit  Medication Sig Dispense Refill   amLODipine (NORVASC) 10 MG tablet Take 1 tablet (10 mg total) by mouth daily. 90 tablet 0   lisinopril (ZESTRIL) 20 MG tablet Take 1 tablet (20 mg total) by mouth daily. 90 tablet 1   metFORMIN (GLUCOPHAGE XR) 500 MG 24 hr tablet Take 1 tablet twice a day for 1 week. If no GI symptoms week 2 take 2 tablets twice a day (Patient taking differently: 1,000 mg 2 (two) times daily.) 120 tablet 1   methocarbamol (ROBAXIN) 500 MG tablet Take 1 tablet (500 mg  total) by mouth every 6 (six) hours as needed for muscle spasms. 30 tablet 0   naproxen (NAPROSYN) 250 MG tablet Take 1,000 mg by mouth daily as needed for mild pain or moderate pain.     blood glucose meter kit and supplies Dispense based on patient and insurance preference. Use up to four times daily as directed. (FOR ICD-10 E10.9, E11.9). 1 each 0   gabapentin (NEURONTIN) 300 MG capsule Take a 300 mg capsule three times a day for two weeks following surgery.Then take a 300 mg capsule two times a day for two weeks. Then take a 300 mg capsule once a day for two weeks. Then discontinue. (Patient not taking: Reported on 05/20/2022) 84 capsule 0   ondansetron (ZOFRAN) 4 MG tablet Take 1 tablet (4 mg total) by mouth every 8 (eight) hours as needed for nausea or vomiting. (Patient not taking: Reported on 05/20/2022) 30 tablet 0   No current facility-administered medications on file prior to visit.    Objective:   Vitals:   05/20/22 1452  BP: (Abnormal) 111/57  Pulse: 77  Resp: 20  Temp: 98.1 F (36.7 C)  TempSrc: Oral  SpO2: 96%  Weight: 229 lb (103.9 kg)    Comprehensive ROS Pertinent positive and negative noted in HPI   Exam General appearance : Awake, alert, not in any distress. Speech Clear. Not toxic looking HEENT: Atraumatic and Normocephalic, pupils equally reactive to light  and accomodation Neck: Supple, no JVD. No cervical lymphadenopathy.  Chest: Good air entry bilaterally, no added sounds  CVS: S1 S2 regular, no murmurs.  Abdomen: Bowel sounds present, Non tender and not distended with no gaurding, rigidity or rebound. Extremities: B/L Lower Ext shows no edema, both legs are warm to touch Neurology: Awake alert, and oriented X 3, Non focal Skin: No Rash  Data Review Lab Results  Component Value Date   HGBA1C 6.4 05/20/2022   HGBA1C 6.2 (H) 06/24/2021   HGBA1C 6.1 (A) 05/08/2021  James Robinson was seen today for diabetes and hypertension.  Diagnoses and all orders for this  visit:  Type 2 diabetes mellitus without complication, without long-term current use of insulin -     HgB A1c 6.4  -     Glucose (CBG) - educated on lifestyle modifications, including but not limited to diet choices and adding exercise to daily routine.    Back pain, unspecified back location, unspecified back pain laterality, unspecified chronicity/ Acute bilateral low back pain without sciatica L2-S1 -     POCT URINALYSIS DIP (CLINITEK)- negative- see HPI  Screening for colon cancer cologuard  Essential hypertension BP goal - < 130/80 well controlled Explained that having normal blood pressure is the goal and medications are helping to get to goal and maintain normal blood pressure. DIET: Limit salt intake, read nutrition labels to check salt content, limit fried and high fatty foods  Avoid using multisymptom OTC cold preparations that generally contain sudafed which can rise BP. Consult with pharmacist on best cold relief products to use for persons with HTN EXERCISE Discussed incorporating exercise such as walking - 30 minutes most days of the week and can do in 10 minute intervals     Patient have been counseled extensively about nutrition and exercise. Other issues discussed during this visit include: low cholesterol diet, weight control and daily exercise, foot care, annual eye examinations at Ophthalmology, importance of adherence with medications and regular follow-up. We also discussed long term complications of uncontrolled diabetes and hypertension.   No follow-ups on file.  The patient was given clear instructions to go to ER or return to medical center if symptoms don't improve, worsen or new problems develop. The patient verbalized understanding. The patient was told to call to get lab results if they haven't heard anything in the next week.   This note has been created with Education officer, environmental. Any transcriptional errors are  unintentional.   Grayce Sessions, NP 05/20/2022, 3:16 PM

## 2022-05-21 LAB — MICROALBUMIN / CREATININE URINE RATIO
Creatinine, Urine: 257.9 mg/dL
Microalb/Creat Ratio: 49 mg/g creat — ABNORMAL HIGH (ref 0–29)
Microalbumin, Urine: 125.1 ug/mL

## 2022-05-22 ENCOUNTER — Telehealth (INDEPENDENT_AMBULATORY_CARE_PROVIDER_SITE_OTHER): Payer: Self-pay

## 2022-05-22 NOTE — Telephone Encounter (Signed)
-----   Message from Guy Franco, RN sent at 05/22/2022 12:03 PM EDT -----  ----- Message ----- From: Grayce Sessions, NP Sent: 05/21/2022   4:05 PM EDT To: Guy Franco, RN  Kidney function normal

## 2022-05-22 NOTE — Telephone Encounter (Signed)
Pt was called and vm was left, Information has been sent to nurse pool.   

## 2022-08-19 ENCOUNTER — Other Ambulatory Visit (INDEPENDENT_AMBULATORY_CARE_PROVIDER_SITE_OTHER): Payer: Self-pay | Admitting: Primary Care

## 2022-08-19 DIAGNOSIS — I10 Essential (primary) hypertension: Secondary | ICD-10-CM

## 2022-08-19 DIAGNOSIS — Z760 Encounter for issue of repeat prescription: Secondary | ICD-10-CM

## 2022-08-20 ENCOUNTER — Other Ambulatory Visit (INDEPENDENT_AMBULATORY_CARE_PROVIDER_SITE_OTHER): Payer: Self-pay | Admitting: Primary Care

## 2022-08-20 ENCOUNTER — Telehealth (INDEPENDENT_AMBULATORY_CARE_PROVIDER_SITE_OTHER): Payer: Self-pay | Admitting: Primary Care

## 2022-08-20 DIAGNOSIS — I10 Essential (primary) hypertension: Secondary | ICD-10-CM

## 2022-08-20 DIAGNOSIS — Z760 Encounter for issue of repeat prescription: Secondary | ICD-10-CM

## 2022-08-20 NOTE — Telephone Encounter (Signed)
Already refilled today in refill encounter from pharmacy today.

## 2022-08-20 NOTE — Telephone Encounter (Signed)
Medication Refill - Medication: amLODipine (NORVASC) 10 MG  Has the patient contacted their pharmacy? Yes.   Pharmacy has sent in two refill request for the pt. I did advise to the pt that it can take up to 3 business days for the refill. Pt states that he has been out of his medication for two days.   Preferred Pharmacy (with phone number or street name): [facility] [address] [place]), [place] - [id number]  Phone: [phone] Fax: [phone]  Has the patient been seen for an appointment in the last year OR does the patient have an upcoming appointment? Yes.    Agent: Please be advised that RX refills may take up to 3 business days. We ask that you follow-up with your pharmacy.

## 2022-08-20 NOTE — Telephone Encounter (Signed)
Requested Prescriptions  Pending Prescriptions Disp Refills   amLODipine (NORVASC) 10 MG tablet [Pharmacy Med Name: amLODIPine Besylate 10 MG Oral Tablet] 90 tablet 0    Sig: Take 1 tablet by mouth once daily     Cardiovascular: Calcium Channel Blockers 2 Passed - 08/20/2022  2:24 PM      Passed - Last BP in normal range    BP Readings from Last 1 Encounters:  05/20/22 (!) 111/57         Passed - Last Heart Rate in normal range    Pulse Readings from Last 1 Encounters:  05/20/22 77         Passed - Valid encounter within last 6 months    Recent Outpatient Visits           3 months ago Type 2 diabetes mellitus without complication, without long-term current use of insulin (HCC)   [place] [facility] Grayce Sessions, NP   1 year ago Acute hip pain, left   [place] [facility] Grayce Sessions, NP   1 year ago Type 2 diabetes mellitus without complication, without long-term current use of insulin (HCC)   [place] [facility] Grayce Sessions, NP   1 year ago Medication refill   [place] [facility] Grayce Sessions, NP   1 year ago Type 2 diabetes mellitus without complication, without long-term current use of insulin [facility])   [place] [facility] [place], Cornelius Moras, RPH-CPP       Future Appointments             In 3 months Randa Evens, Kinnie Scales, NP [place] [facility]

## 2022-08-20 NOTE — Telephone Encounter (Signed)
Will forward to provider  

## 2022-10-02 IMAGING — DX DG PORTABLE PELVIS
1 series · 1 of 1 positions shown · non-contrast
Comparison: Intraoperative hip radiographs obtained earlier the
same day

CLINICAL DATA: Status post left hip replacement

EXAM:
PORTABLE PELVIS 1-2 VIEWS

[pelvis ap]
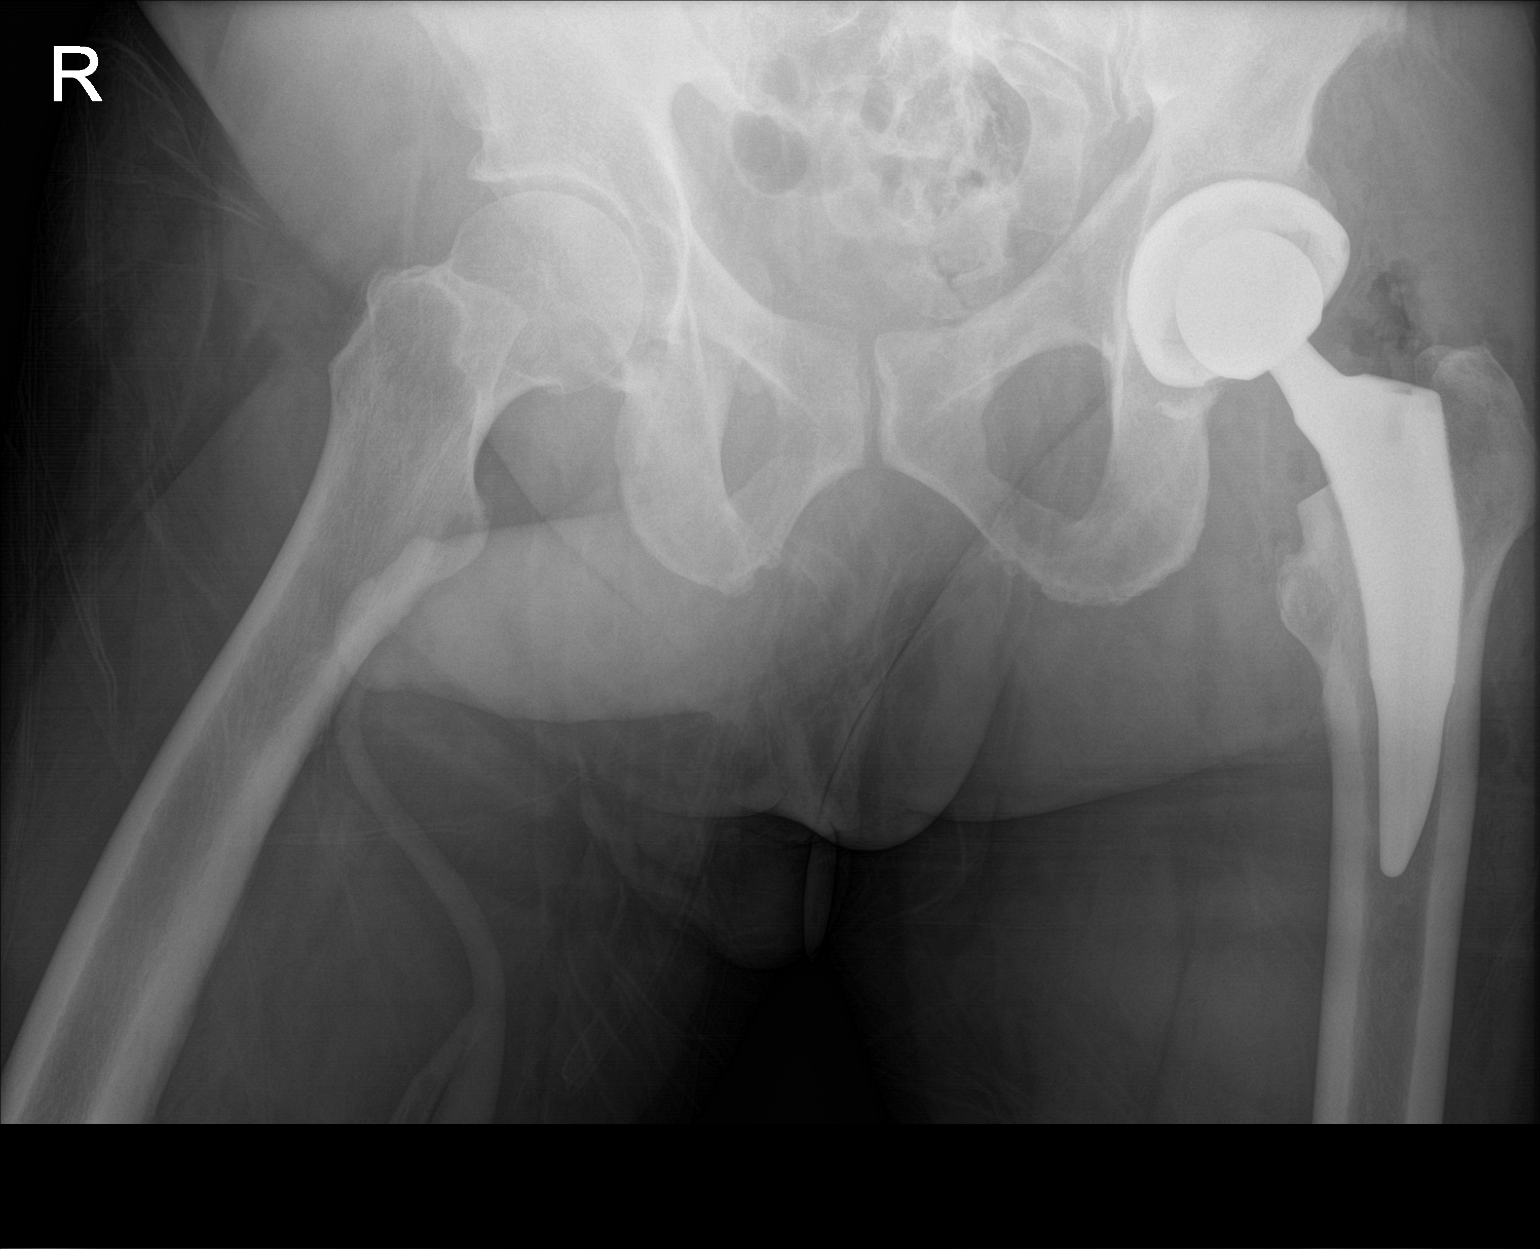

[1 of 1 positions shown; findings below may reference images not displayed]

FINDINGS: The patient is status post left hip arthroplasty. Hardware alignment
is within expected limits, without evidence of complication. There
is expected surrounding postoperative soft tissue gas. Right hip
alignment is maintained. The symphysis pubis is intact.
IMPRESSION: Status post left hip arthroplasty without evidence of complication.

## 2022-10-02 IMAGING — RF DG HIP (WITH OR WITHOUT PELVIS) 1V*L*
1 series · 2 of 2 positions shown · non-contrast
Comparison: None Available.

CLINICAL DATA: Left total hip arthroplasty

EXAM:
DG HIP (WITH OR WITHOUT PELVIS) 1V*L*; DG C-ARM 1-60 MIN-NO REPORT

[Series 1: unknown protocol · 0.20mm/px · 2 of 2 slices shown]
[im 1/2]
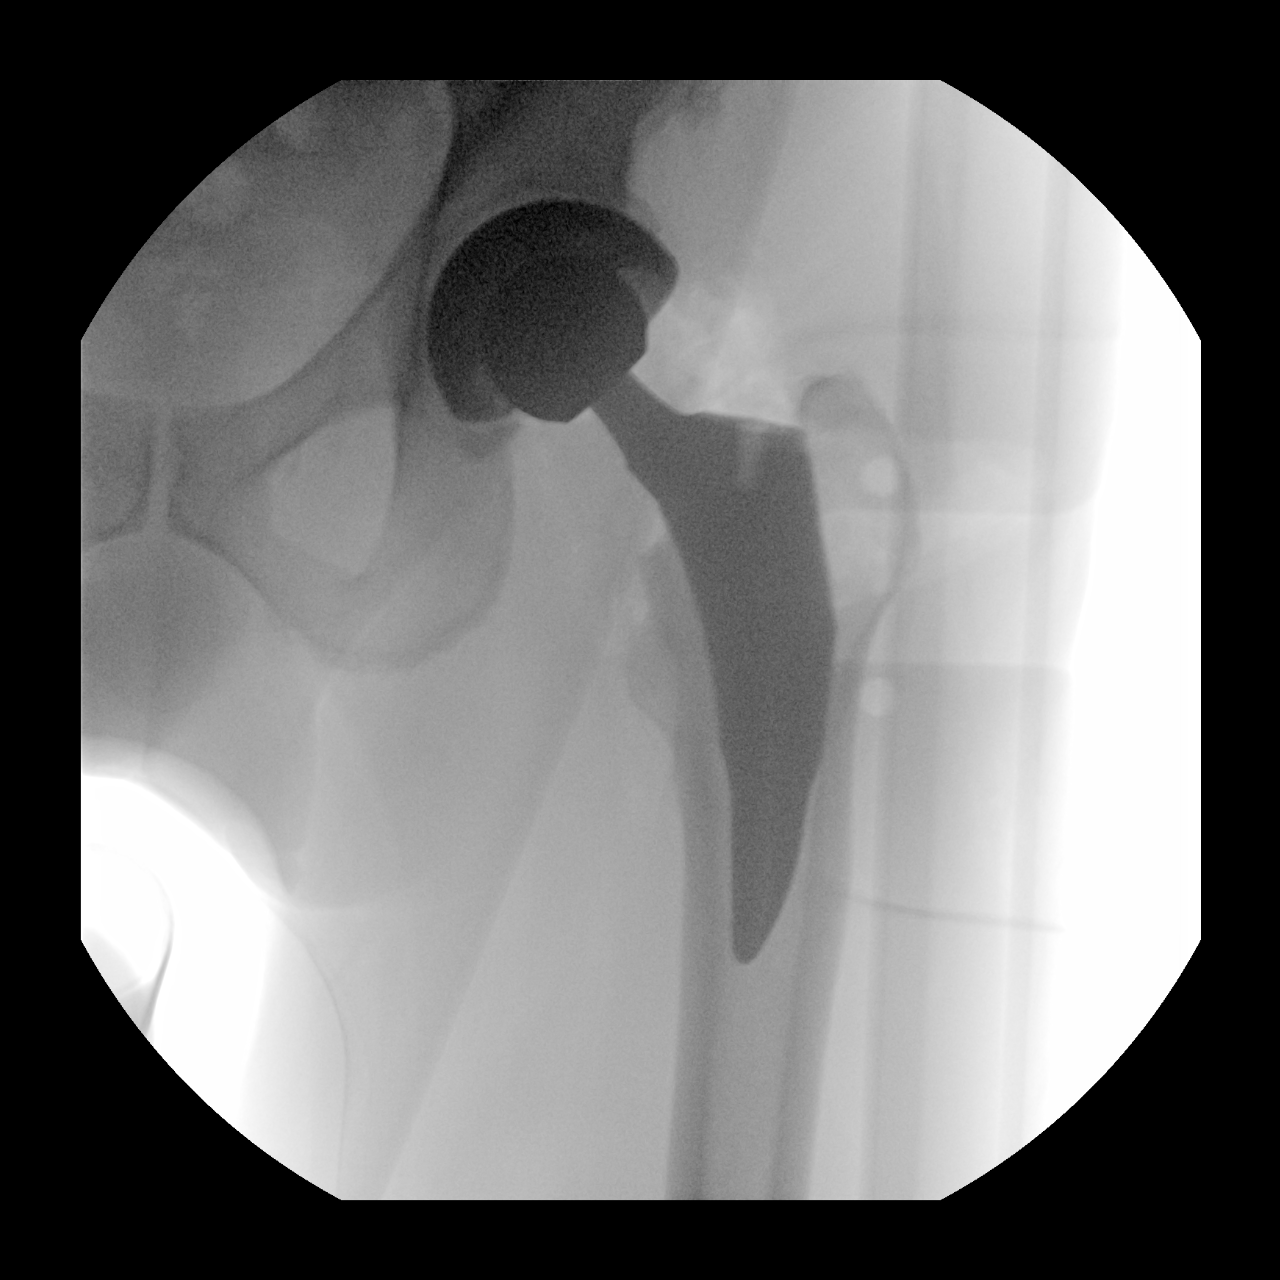
[im 2/2]
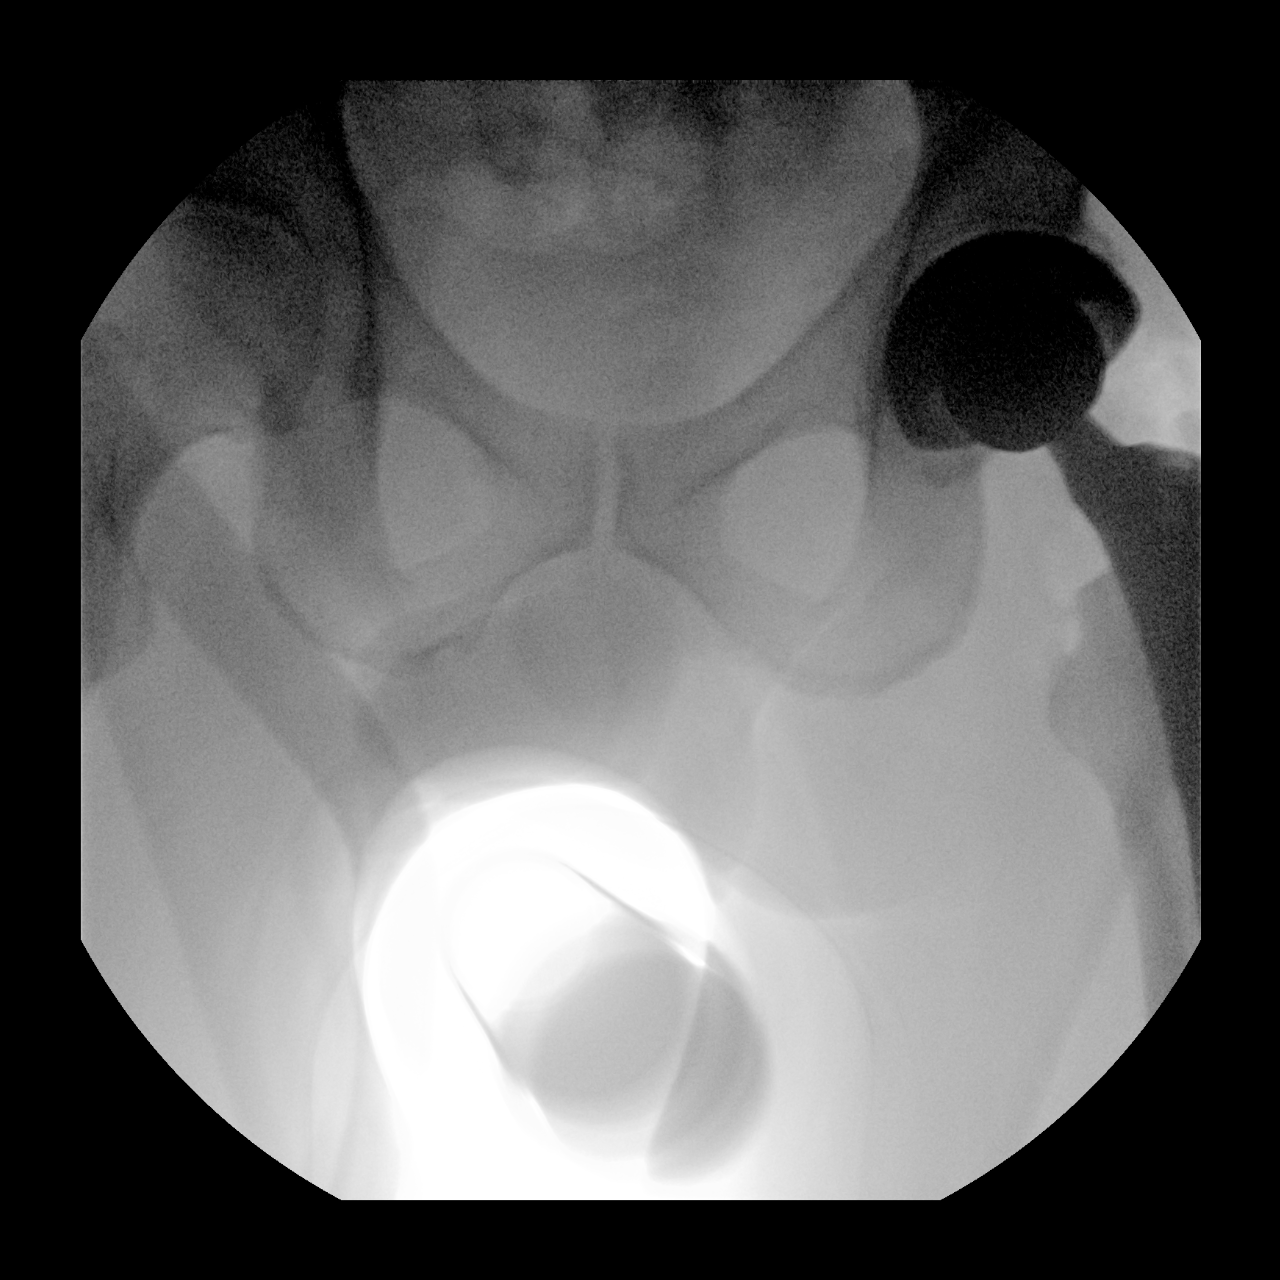

[2 of 2 positions shown; findings below may reference images not displayed]

FLUOROSCOPY TIME:  Radiation Exposure Index (if provided by the
fluoroscopic device): 1.3 mGy
FINDINGS: Multiple nondiagnostic spot fluoroscopic intraoperative left hip
radiographs demonstrate expected postsurgical changes from left
total hip arthroplasty with no evidence of left hip dislocation.
IMPRESSION: Intraoperative fluoroscopic guidance for left total hip
arthroplasty.

## 2022-11-13 ENCOUNTER — Other Ambulatory Visit: Payer: Self-pay | Admitting: Primary Care

## 2022-11-13 DIAGNOSIS — Z1212 Encounter for screening for malignant neoplasm of rectum: Secondary | ICD-10-CM

## 2022-11-13 DIAGNOSIS — Z1211 Encounter for screening for malignant neoplasm of colon: Secondary | ICD-10-CM

## 2022-11-19 ENCOUNTER — Ambulatory Visit (INDEPENDENT_AMBULATORY_CARE_PROVIDER_SITE_OTHER): Payer: Self-pay | Admitting: Primary Care

## 2022-11-19 ENCOUNTER — Encounter (INDEPENDENT_AMBULATORY_CARE_PROVIDER_SITE_OTHER): Payer: Self-pay

## 2023-01-26 ENCOUNTER — Encounter (INDEPENDENT_AMBULATORY_CARE_PROVIDER_SITE_OTHER): Payer: Self-pay | Admitting: Primary Care

## 2023-01-26 ENCOUNTER — Ambulatory Visit (INDEPENDENT_AMBULATORY_CARE_PROVIDER_SITE_OTHER): Payer: Medicaid Other | Admitting: Primary Care

## 2023-01-26 VITALS — BP 151/79 | HR 85 | Resp 16 | Ht 71.0 in | Wt 229.8 lb

## 2023-01-26 DIAGNOSIS — R0981 Nasal congestion: Secondary | ICD-10-CM | POA: Diagnosis not present

## 2023-01-26 DIAGNOSIS — Z760 Encounter for issue of repeat prescription: Secondary | ICD-10-CM

## 2023-01-26 DIAGNOSIS — I10 Essential (primary) hypertension: Secondary | ICD-10-CM

## 2023-01-26 DIAGNOSIS — E119 Type 2 diabetes mellitus without complications: Secondary | ICD-10-CM

## 2023-01-26 DIAGNOSIS — Z7984 Long term (current) use of oral hypoglycemic drugs: Secondary | ICD-10-CM | POA: Diagnosis not present

## 2023-01-26 MED ORDER — METFORMIN HCL ER 500 MG PO TB24
ORAL_TABLET | ORAL | 1 refills | Status: DC
Start: 2023-01-26 — End: 2023-06-17

## 2023-01-26 MED ORDER — ALBUTEROL SULFATE HFA 108 (90 BASE) MCG/ACT IN AERS: 2.0000 | INHALATION_SPRAY | Freq: Four times a day (QID) | RESPIRATORY_TRACT | 2 refills | Status: DC | PRN

## 2023-01-26 MED ORDER — CETIRIZINE HCL 10 MG PO TABS: 10.0000 mg | ORAL_TABLET | Freq: Every day | ORAL | 0 refills | Status: DC

## 2023-01-26 MED ORDER — PREDNISONE 10 MG (21) PO TBPK
ORAL_TABLET | ORAL | 0 refills | Status: DC
Start: 2023-01-26 — End: 2023-06-17

## 2023-01-26 MED ORDER — AMLODIPINE BESYLATE 10 MG PO TABS
10.0000 mg | ORAL_TABLET | Freq: Every day | ORAL | 1 refills | Status: DC
Start: 2023-01-26 — End: 2023-06-17

## 2023-01-26 MED ORDER — LISINOPRIL 20 MG PO TABS
20.0000 mg | ORAL_TABLET | Freq: Every day | ORAL | 1 refills | Status: DC
Start: 2023-01-26 — End: 2023-06-17

## 2023-01-26 MED ORDER — FLUTICASONE PROPIONATE 50 MCG/ACT NA SUSP: 2.0000 | Freq: Every day | NASAL | 6 refills | Status: DC

## 2023-01-26 NOTE — Progress Notes (Addendum)
James Robinson, is a 62 y.o. male  UJW:[id number]  DOB - 1960-09-28   Subjective:   James Robinson is a 62 y.o. male here today for acute allergy complaints with nasal congestion that have been occurring for months  Patient has No chest pain, No abdominal pain - No Nausea, No new weakness tingling or numbness, Patient endorse fatigue, shortness of breath and , wheezing Denies/ Admits positive sick exposure or precipitating event.  Describes congestion as constant/ intermittent with white sputum.  Has tried OTC  without relief.  Symptoms are made worse with weather , chemical use to clean grills .  Reports/ denies previous symptoms in the past. Denies fever, chills, chest pain, nausea, changes in bowel or bladder habits.    No problems updated.  No Known Allergies  Past Medical History:  Diagnosis Date   Anxiety    Arthritis    Depression    Diabetes mellitus without complication (HCC)    Hyperlipidemia    Hypertension Dx 2008   on BP medicine for a brief period     Current Outpatient Medications on File Prior to Visit  Medication Sig Dispense Refill   amLODipine (NORVASC) 10 MG tablet Take 1 tablet by mouth once daily 90 tablet 0   lisinopril (ZESTRIL) 20 MG tablet Take 1 tablet (20 mg total) by mouth daily. 90 tablet 1   metFORMIN (GLUCOPHAGE XR) 500 MG 24 hr tablet Take 1 tablet twice a day 120 tablet 1   blood glucose meter kit and supplies Dispense based on patient and insurance preference. Use up to four times daily as directed. (FOR ICD-10 E10.9, E11.9). 1 each 0   No current facility-administered medications on file prior to visit.     Objective:   Vitals:   01/26/23 0938 01/26/23 0939  BP: (!) 150/81 (!) 151/79  Pulse: 85   Resp: 16   SpO2: 97%   Weight: 229 lb 12.8 oz (104.2 kg)   Height: 5\' 11"  (1.803 m)     Exam General appearance : Awake, alert, not in any distress. Speech Clear. Not toxic looking HEENT: Atraumatic and Normocephalic, pupils equally  reactive to light and accomodation, enlargement red swollen , boggy turbinates. Right>Left Neck: Supple, no JVD. No cervical lymphadenopathy.  Chest: Good air entry bilaterally, no added sounds  CVS: S1 S2 regular, no murmurs.  Abdomen: Bowel sounds present, Non tender and not distended with no gaurding, rigidity or rebound. Extremities: B/L Lower Ext shows no edema, both legs are warm to touch Neurology: Awake alert, and oriented X 3, Non focal Skin: No Rash   Assessment & Plan  James Robinson was seen today for sinus problem.  Diagnoses and all orders for this visit:  Nasal congestion -      -     predniSONE (STERAPRED UNI-PAK 21 TAB) 10 MG (21) TBPK tablet; Prednisone dose pack directions Day 1 take 6 tablets Day 2 take 5 tablets Day 3 take 4 tablets Day 4 take 3 tablets Day 3 take 2 tablets Day 4 take 1 tablet Dispense 21 tablets -     fluticasone (FLONASE) 50 MCG/ACT nasal spray; Place 2 sprays into both nostrils daily. -     cetirizine (ZYRTEC ALLERGY) 10 MG tablet; Take 1 tablet (10 mg total) by mouth daily. -     Ambulatory referral to ENT  Essential hypertension -     amLODipine (NORVASC) 10 MG tablet; Take 1 tablet (10 mg total) by mouth daily.  Medication refill -  amLODipine (NORVASC) 10 MG tablet; Take 1 tablet (10 mg total) by mouth daily. -     metFORMIN (GLUCOPHAGE XR) 500 MG 24 hr tablet; Take 1 tablet twice a day -     lisinopril (ZESTRIL) 20 MG tablet; Take 1 tablet (20 mg total) by mouth daily.  Type 2 diabetes mellitus without complication, without long-term current use of insulin (HCC) -     metFORMIN (GLUCOPHAGE XR) 500 MG 24 hr tablet; Take 1 tablet twice a day   Return in about 1 month (around 02/26/2023) for DM and HTN .  The patient was given clear instructions to go to ER or return to medical center if symptoms don't improve, worsen or new problems develop. The patient verbalized understanding. The patient was told to call to get lab results if they haven't  heard anything in the next week.   This note has been created with Education officer, environmental. Any transcriptional errors are unintentional.   Grayce Sessions, NP 01/26/2023, 10:03 AM

## 2023-01-26 NOTE — Addendum Note (Signed)
Addended by: Gwinda Passe on: 01/26/2023 10:25 AM   Modules accepted: Orders

## 2023-01-26 NOTE — Patient Instructions (Addendum)
Symptoms Of Enlarged Turbinates Chronic nasal obstruction or a stuffy nose, is often caused by enlargement (hypertrophy) of the inferior turbinate. Congested nasal obstruction can impair normal breathing, forcing patients to breathe through the mouth and often affects their daily activities. Use Flonase nasal spray for at least duration of your allergy season.  - For appropriate administration of the nasal spray, clear the nose, use opposite hand for opposite nare, sniff gently, exhale through your mouth. - For maximal effect take these two nasal sprays at least 30 minutes apart. - Continue Allegra, Claritin or Zyrtec each day, as needed. - Drink at least 64 ounces of water each day. - If you have a humidifier use it nightly. - Remove as many irritants/allergies as you are able to, no pets in the bedroom, change air filters in air vents. .  Allergic reaction can occur when your immune system reacts to a foreign substance -- such as pollen, bee venom or pet dander -- or a food that doesn't cause a reaction in most people.Your immune system produces substances known as antibodies. When you have allergies, your immune system makes antibodies that identify a particular allergen as harmful, even though it isn't. When you come into contact with the allergen, your immune system's reaction can inflame your skin, sinuses, airways or digestive system.  [email or web address]

## 2023-02-16 ENCOUNTER — Encounter (INDEPENDENT_AMBULATORY_CARE_PROVIDER_SITE_OTHER): Payer: Self-pay | Admitting: Otolaryngology

## 2023-03-09 ENCOUNTER — Ambulatory Visit (INDEPENDENT_AMBULATORY_CARE_PROVIDER_SITE_OTHER): Payer: Medicaid Other | Admitting: Primary Care

## 2023-03-18 ENCOUNTER — Ambulatory Visit (INDEPENDENT_AMBULATORY_CARE_PROVIDER_SITE_OTHER): Payer: Medicaid Other | Admitting: Primary Care

## 2023-03-18 ENCOUNTER — Encounter (INDEPENDENT_AMBULATORY_CARE_PROVIDER_SITE_OTHER): Payer: Self-pay | Admitting: Primary Care

## 2023-03-18 VITALS — BP 113/72 | HR 78 | Resp 16 | Ht 71.0 in | Wt 226.8 lb

## 2023-03-18 DIAGNOSIS — E782 Mixed hyperlipidemia: Secondary | ICD-10-CM

## 2023-03-18 DIAGNOSIS — Z794 Long term (current) use of insulin: Secondary | ICD-10-CM

## 2023-03-18 DIAGNOSIS — E6609 Other obesity due to excess calories: Secondary | ICD-10-CM

## 2023-03-18 DIAGNOSIS — Z23 Encounter for immunization: Secondary | ICD-10-CM | POA: Diagnosis not present

## 2023-03-18 DIAGNOSIS — E66811 Obesity, class 1: Secondary | ICD-10-CM

## 2023-03-18 DIAGNOSIS — Z6831 Body mass index (BMI) 31.0-31.9, adult: Secondary | ICD-10-CM

## 2023-03-18 DIAGNOSIS — E119 Type 2 diabetes mellitus without complications: Secondary | ICD-10-CM

## 2023-03-18 DIAGNOSIS — I10 Essential (primary) hypertension: Secondary | ICD-10-CM | POA: Diagnosis not present

## 2023-03-18 NOTE — Progress Notes (Signed)
 [facility]  James Robinson, is a 63 y.o. male  [id number]  FMW:[id number]  DOB - March 30, 1960  Chief Complaint  Patient presents with   Diabetes   Hypertension       Subjective:   James Robinson is a 63 y.o. male here today for a follow up visit management of hypertension.  Patient has No headache, No chest pain, No abdominal pain - No Nausea, No new weakness tingling or numbness, No Cough - shortness of breath.  Type 2 diabetes- Denies polyuria, polydipsia, polyphasia or vision changes.  Does not check blood sugars at home.    No problems updated.  Comprehensive ROS Pertinent positive and negative noted in HPI   No Known Allergies  Past Medical History:  Diagnosis Date   Anxiety    Arthritis    Depression    Diabetes mellitus without complication (HCC)    Hyperlipidemia    Hypertension Dx 2008   on BP medicine for a brief period     Current Outpatient Medications on File Prior to Visit  Medication Sig Dispense Refill   albuterol  (VENTOLIN  HFA) 108 (90 Base) MCG/ACT inhaler Inhale 2 puffs into the lungs every 6 (six) hours as needed for wheezing or shortness of breath. 18 g 2   amLODipine  (NORVASC ) 10 MG tablet Take 1 tablet (10 mg total) by mouth daily. 90 tablet 1   blood glucose meter kit and supplies Dispense based on patient and insurance preference. Use up to four times daily as directed. (FOR ICD-10 E10.9, E11.9). 1 each 0   cetirizine  (ZYRTEC  ALLERGY) 10 MG tablet Take 1 tablet (10 mg total) by mouth daily. 90 tablet 0   fluticasone  (FLONASE ) 50 MCG/ACT nasal spray Place 2 sprays into both nostrils daily. 16 g 6   lisinopril  (ZESTRIL ) 20 MG tablet Take 1 tablet (20 mg total) by mouth daily. 90 tablet 1   metFORMIN  (GLUCOPHAGE  XR) 500 MG 24 hr tablet Take 1 tablet twice a day 120 tablet 1   predniSONE  (STERAPRED UNI-PAK 21 TAB) 10 MG (21) TBPK tablet Prednisone  dose pack directions Day 1 take 6 tablets Day 2 take 5 tablets Day 3 take 4 tablets Day 4  take 3 tablets Day 3 take 2 tablets Day 4 take 1 tablet Dispense 21 tablets 21 each 0   No current facility-administered medications on file prior to visit.   Health Maintenance  Topic Date Due   Pneumococcal Vaccination (1 of 2 - PCV) Never done   Zoster (Shingles) Vaccine (1 of 2) Never done   Cologuard (Stool DNA test)  05/31/2020   Yearly kidney function blood test for diabetes  06/28/2022   COVID-19 Vaccine (1 - 2024-25 season) Never done   Flu Shot  05/10/2023*   Yearly kidney health urinalysis for diabetes  05/20/2023   DTaP/Tdap/Td vaccine (3 - Td or Tdap) 09/10/2029   Hepatitis C Screening  Completed   HIV Screening  Completed   HPV Vaccine  Aged Out  *Topic was postponed. The date shown is not the original due date.    Objective:   Vitals:   03/18/23 1129  BP: 113/72  Pulse: 78  Resp: 16  SpO2: 99%  Weight: 226 lb 12.8 oz (102.9 kg)  Height: 5' 11 (1.803 m)     Physical Exam    Assessment & Plan  James Robinson was seen today for diabetes and hypertension.  Diagnoses and all orders for this visit:  Essential hypertension Well-controlled on amlodipine  10 mg -  CBC with Differential/Platelet -     CMP14+EGFR  Type 2 diabetes mellitus without complication, without long-term current use of insulin  (HCC) - educated on lifestyle modifications, including but not limited to diet choices and adding exercise to daily routine.   -     CBC with Differential/Platelet -     Hemoglobin A1c  Mixed hyperlipidemia -     Lipid panel  Class 1 obesity due to excess calories without serious comorbidity with body mass index (BMI) of 31.0 to 31.9 in adult Obesity is 30-39 indicating an excess in caloric intake or underlining conditions. This may lead to other co-morbidities. Educated on lifestyle modifications of diet and exercise which may reduce obesity.       Patient have been counseled extensively about nutrition and exercise. Other issues discussed during this visit  include: low cholesterol diet, weight control and daily exercise, foot care, annual eye examinations at Ophthalmology, importance of adherence with medications and regular follow-up. We also discussed long term complications of uncontrolled diabetes and hypertension.   Return in about 3 months (around 06/15/2023).  The patient was given clear instructions to go to ER or return to medical center if symptoms don't improve, worsen or new problems develop. The patient verbalized understanding. The patient was told to call to get lab results if they haven't heard anything in the next week.   This note has been created with Education officer, environmental. Any transcriptional errors are unintentional.   James SHAUNNA Bohr, NP 03/18/2023, 12:05 PM

## 2023-03-18 NOTE — Patient Instructions (Signed)
Pneumococcal Conjugate Vaccine: What You Need to Know Many vaccine information statements are available in Spanish and other languages. See PromoAge.com.br. 1. Why get vaccinated? Pneumococcal conjugate vaccine can prevent pneumococcal disease. Pneumococcal disease refers to any illness caused by pneumococcal bacteria. These bacteria can cause many types of illnesses, including pneumonia, which is an infection of the lungs. Pneumococcal bacteria are one of the most common causes of pneumonia. Besides pneumonia, pneumococcal bacteria can also cause: Ear infections Sinus infections Meningitis (infection of the tissue covering the brain and spinal cord) Bacteremia (infection of the blood) Anyone can get pneumococcal disease, but children under 13 years old, people with certain medical conditions or other risk factors, and adults 65 years or older are at the highest risk. Most pneumococcal infections are mild. However, some can result in long-term problems, such as brain damage or hearing loss. Meningitis, bacteremia, and pneumonia caused by pneumococcal disease can be fatal. 2. Pneumococcal conjugate vaccine Pneumococcal conjugate vaccine helps protect against bacteria that cause pneumococcal disease. There are three pneumococcal conjugate vaccines (PCV13, PCV15, and PCV20). The different vaccines are recommended for different people based on age and medical status. Your health care provider can help you determine which type of pneumococcal conjugate vaccine, and how many doses, you should receive. Infants and young children usually need 4 doses of pneumococcal conjugate vaccine. These doses are recommended at 2, 4, 6, and 58-29 months of age. Older children and adolescents might need pneumococcal conjugate vaccine depending on their age and medical conditions or other risk factors if they did not receive the recommended doses as infants or young children. Adults 19 through 11 years old with certain  medical conditions or other risk factors who have not already received pneumococcal conjugate vaccine should receive pneumococcal conjugate vaccine. Adults 65 years or older who have not previously received pneumococcal conjugate vaccine should receive pneumococcal conjugate vaccine. Some people with certain medical conditions are also recommended to receive pneumococcal polysaccharide vaccine (a different type of pneumococcal vaccine known as PPSV23). Some adults who have previously received a pneumococcal conjugate vaccine may be recommended to receive another pneumococcal conjugate vaccine. 3. Talk with your health care provider Tell your vaccination provider if the person getting the vaccine: Has had an allergic reaction after a previous dose of any type of pneumococcal conjugate vaccine (PCV13, PCV15, PCV20, or an earlier pneumococcal conjugate vaccine known as PCV7), or to any vaccine containing diphtheria toxoid (for example, DTaP), or has any severe, life-threatening allergies In some cases, your health care provider may decide to postpone pneumococcal conjugate vaccination until a future visit. People with minor illnesses, such as a cold, may be vaccinated. People who are moderately or severely ill should usually wait until they recover. Your health care provider can give you more information. 4. Risks of a vaccine reaction Redness, swelling, pain, or tenderness where the shot is given, and fever, loss of appetite, fussiness (irritability), feeling tired, headache, muscle aches, joint pain, and chills can happen after pneumococcal conjugate vaccination. Young children may be at increased risk for seizures caused by fever after a pneumococcal conjugate vaccine if it is administered at the same time as inactivated influenza vaccine. Ask your health care provider for more information. People sometimes faint after medical procedures, including vaccination. Tell your provider if you feel dizzy or  have vision changes or ringing in the ears. As with any medicine, there is a very remote chance of a vaccine causing a severe allergic reaction, other serious injury, or death. 5.  What if there is a serious problem? An allergic reaction could occur after the vaccinated person leaves the clinic. If you see signs of a severe allergic reaction (hives, swelling of the face and throat, difficulty breathing, a fast heartbeat, dizziness, or weakness), call 9-1-1 and get the person to the nearest hospital. For other signs that concern you, call your health care provider. Adverse reactions should be reported to the Vaccine Adverse Event Reporting System (VAERS). Your health care provider will usually file this report, or you can do it yourself. Visit the VAERS website at www.vaers.LAgents.no or call 913-439-3438. VAERS is only for reporting reactions, and VAERS staff members do not give medical advice. 6. The National Vaccine Injury Compensation Program The [employer] Vaccine Injury Compensation Program (VICP) is a federal program that was created to compensate people who may have been injured by certain vaccines. Claims regarding alleged injury or death due to vaccination have a time limit for filing, which may be as short as two years. Visit the VICP website at SpiritualWord.at or call 860 253 0829 to learn about the program and about filing a claim. 7. How can I learn more? Ask your health care provider. Call your local or state health department. Visit the website of the Food and Drug Administration (FDA) for vaccine package inserts and additional information at FinderList.no. Contact the Centers for Disease Control and Prevention (CDC): Call 4055360835 (1-800-CDC-INFO) or Visit CDC's website at PicCapture.uy. Source: CDC Vaccine Information Statement (Interim) Pneumococcal Conjugate Vaccine (06/20/2021) This same material is available at  FootballExhibition.com.br for no charge. This information is not intended to replace advice given to you by your health care provider. Make sure you discuss any questions you have with your health care provider. Document Revised: 05/13/2022 Document Reviewed: 02/16/2022 Elsevier Patient Education  2024 [employer].

## 2023-03-19 LAB — LIPID PANEL
Chol/HDL Ratio: 3.1 {ratio} (ref 0.0–5.0)
Cholesterol, Total: 147 mg/dL (ref 100–199)
HDL: 47 mg/dL (ref >39)
LDL Chol Calc (NIH): 83 mg/dL (ref 0–99)
Triglycerides: 89 mg/dL (ref 0–149)
VLDL Cholesterol Cal: 17 mg/dL (ref 5–40)

## 2023-03-19 LAB — CBC WITH DIFFERENTIAL/PLATELET
Basophils Absolute: 0 10*3/uL (ref 0.0–0.2)
Basos: 0 %
EOS (ABSOLUTE): 0.2 10*3/uL (ref 0.0–0.4)
Eos: 2 %
Hematocrit: 38.2 % (ref 37.5–51.0)
Hemoglobin: 12.7 g/dL — ABNORMAL LOW (ref 13.0–17.7)
Immature Grans (Abs): 0 10*3/uL (ref 0.0–0.1)
Immature Granulocytes: 0 %
Lymphocytes Absolute: 1.8 10*3/uL (ref 0.7–3.1)
Lymphs: 26 %
MCH: 32.3 pg (ref 26.6–33.0)
MCHC: 33.2 g/dL (ref 31.5–35.7)
MCV: 97 fL (ref 79–97)
Monocytes Absolute: 0.7 10*3/uL (ref 0.1–0.9)
Monocytes: 10 %
Neutrophils Absolute: 4.2 10*3/uL (ref 1.4–7.0)
Neutrophils: 62 %
Platelets: 277 10*3/uL (ref 150–450)
RBC: 3.93 x10E6/uL — ABNORMAL LOW (ref 4.14–5.80)
RDW: 12.5 % (ref 11.6–15.4)
WBC: 6.9 10*3/uL (ref 3.4–10.8)

## 2023-03-19 LAB — CMP14+EGFR
ALT: 17 [IU]/L (ref 0–44)
AST: 13 [IU]/L (ref 0–40)
Albumin: 4.1 g/dL (ref 3.9–4.9)
Alkaline Phosphatase: 133 [IU]/L — ABNORMAL HIGH (ref 44–121)
BUN/Creatinine Ratio: 10 (ref 10–24)
BUN: 9 mg/dL (ref 8–27)
Bilirubin Total: 0.5 mg/dL (ref 0.0–1.2)
CO2: 25 mmol/L (ref 20–29)
Calcium: 8.7 mg/dL (ref 8.6–10.2)
Chloride: 101 mmol/L (ref 96–106)
Creatinine, Ser: 0.86 mg/dL (ref 0.76–1.27)
Globulin, Total: 2.9 g/dL (ref 1.5–4.5)
Glucose: 248 mg/dL — ABNORMAL HIGH (ref 70–99)
Potassium: 4 mmol/L (ref 3.5–5.2)
Sodium: 140 mmol/L (ref 134–144)
Total Protein: 7 g/dL (ref 6.0–8.5)
eGFR: 99 mL/min/{1.73_m2} (ref >59)

## 2023-03-19 LAB — HEMOGLOBIN A1C
Est. average glucose Bld gHb Est-mCnc: 220 mg/dL
Hgb A1c MFr Bld: 9.3 % — ABNORMAL HIGH (ref 4.8–5.6)

## 2023-03-25 ENCOUNTER — Telehealth (INDEPENDENT_AMBULATORY_CARE_PROVIDER_SITE_OTHER): Payer: Self-pay | Admitting: Pharmacist

## 2023-03-25 NOTE — Telephone Encounter (Signed)
Pt got a call from Ear Throat specialist but accidentally deleted information. I was able to provide accurate information for pt to follow back up with specialist. Atp was also scheduled for the pt to get over and see the pharmacist.

## 2023-04-08 ENCOUNTER — Ambulatory Visit: Payer: Medicaid Other | Attending: Primary Care | Admitting: Pharmacist

## 2023-04-08 ENCOUNTER — Encounter: Payer: Self-pay | Admitting: Pharmacist

## 2023-04-08 NOTE — Progress Notes (Unsigned)
 S:     No chief complaint on file.  63 y.o. male who presents for diabetes evaluation, education, and management. Patient arrives in *** good spirits and presents without *** any assistance. ***Patient is accompanied by ***.   Patient was referred and last seen by Primary Care Provider, Gwinda Passe, NP, on 03/18/2023. A1c was 9.3. Denied sx of hyperglycemia.   PMH is significant for HTN, T2DM, mixed hyperlipidemia, class 1 obesity, migraine headaches, internal hemorrhoids, rectal bleeding, OA in left hip/right knee, enlarged prostate, and shoulder pain. At last visit, ***.   Patient reports Diabetes was diagnosed in ***.   Family/Social History:  Fhx: glaucoma Tobacco: 0.5 packs/day for 27 yrs -tried quitting from 07/22/1988-07/23/2015  Current diabetes medications include: metformin 500 mg BID Current hypertension medications include: amlodipine 10 mg daily, lisinopril 20 mg daily Current hyperlipidemia medications include: none -atorvastatin d/c in 2022 d/t nonadherance  Patient reports adherence to taking all medications as prescribed.  *** Patient denies adherence with medications, reports missing *** medications *** times per week, on average.  Insurance coverage: [place]  Patient {Actions; denies-reports:120008} hypoglycemic events.  Reported home fasting blood sugars: ***  Reported 2 hour post-meal/random blood sugars: ***.  Patient {Actions; denies-reports:120008} nocturia (nighttime urination).  Patient {Actions; denies-reports:120008} neuropathy (nerve pain). Patient {Actions; denies-reports:120008} visual changes. Patient {Actions; denies-reports:120008} self foot exams.   Patient reported dietary habits: Eats *** meals/day Breakfast: *** Lunch: *** Dinner: *** Snacks: *** Drinks: ***  Patient-reported exercise habits: ***  O:  7 day average blood glucose: ***  Libre3 CGM Download today *** % Time CGM is active: ***% Average Glucose: ***  mg/dL Glucose Management Indicator: ***  Glucose Variability: ***% (goal <36%) Time in Goal:  - Time in range 70-180: ***% - Time above range: ***% - Time below range: ***% Observed patterns:  Lab Results  Component Value Date   HGBA1C 9.3 (H) 03/18/2023   There were no vitals filed for this visit.  Lipid Panel     Component Value Date/Time   CHOL 147 03/18/2023 1140   TRIG 89 03/18/2023 1140   HDL 47 03/18/2023 1140   CHOLHDL 3.1 03/18/2023 1140   CHOLHDL 2.9 01/01/2014 1027   VLDL 20 01/01/2014 1027   LDLCALC 83 03/18/2023 1140   Clinical Atherosclerotic Cardiovascular Disease (ASCVD): No  The 10-year ASCVD risk score (Arnett DK, et al., 2019) is: 18.9%   Values used to calculate the score:     Age: 21 years     Sex: Male     Is Non-Hispanic African American: Yes     Diabetic: Yes     Tobacco smoker: No     Systolic Blood Pressure: 113 mmHg     Is BP treated: Yes     HDL Cholesterol: 47 mg/dL     Total Cholesterol: 147 mg/dL   Patient is participating in a Managed Medicaid Plan:  Yes   Labs -Microalbumin/creatinine ratio (05/20/22): 49 -Lipids(03/18/23)    + LDL:83    + Triglycerides: 89 -BMP(03/18/23):     +eGFR: (99)    +SrCr: (0.86)    +electrolytes: (wnl)  Assess -home glucose -sx hyper/hypoglycemia -adherence/tolerance -diet/exercise changes  Plan -med recs: start high intensity statin, incr metformin, start Trulicity 0.75 mg  A/P: Diabetes longstanding currently above goal. Patient is *** able to verbalize appropriate hypoglycemia management plan. Medication adherence appears ***. Control is suboptimal due to ***. -*** metformin ***.  -Patient educated on purpose, proper use, and potential adverse effects of ***.  -  Extensively discussed pathophysiology of diabetes, recommended lifestyle interventions, dietary effects on blood sugar control.  -Counseled on s/sx of and management of hypoglycemia.  -Next A1c anticipated 06/2023.   ASCVD risk -  primary prevention in patient with diabetes. Last LDL is 83 not at goal of <70 *** mg/dL. ASCVD risk factors include diabetes,/smoking and 10-year ASCVD risk score of 18.9%. High intensity statin indicated.  -{Meds adjust:18428} ***statin *** mg.   Hypertension longstanding *** currently ***. Blood pressure goal of <130/80 *** mmHg. Medication adherence ***. Blood pressure control is suboptimal due to ***. -{Meds adjust:18428} *** mg.  Written patient instructions provided. Patient verbalized understanding of treatment plan.  Total time in face to face counseling *** minutes.    Follow-up:  Pharmacist *** PCP clinic visit in 06/17/2023  Patient seen with  Haywood Filler, PharmD Candidate [facility] [employer] [employer]  Class of 2027  Butch Penny, PharmD, [place], CPP Clinical Pharmacist [facility] [phone]

## 2023-04-13 ENCOUNTER — Encounter (INDEPENDENT_AMBULATORY_CARE_PROVIDER_SITE_OTHER): Payer: Self-pay | Admitting: *Deleted

## 2023-05-16 ENCOUNTER — Ambulatory Visit
Admission: EM | Admit: 2023-05-16 | Discharge: 2023-05-16 | Disposition: A | Discharge status: Home / Self Care | Attending: Internal Medicine | Admitting: Internal Medicine

## 2023-05-16 ENCOUNTER — Other Ambulatory Visit: Payer: Self-pay

## 2023-05-16 ENCOUNTER — Encounter: Payer: Self-pay | Admitting: *Deleted

## 2023-05-16 DIAGNOSIS — M546 Pain in thoracic spine: Secondary | ICD-10-CM

## 2023-05-16 DIAGNOSIS — M62838 Other muscle spasm: Secondary | ICD-10-CM | POA: Diagnosis not present

## 2023-05-16 MED ORDER — METHOCARBAMOL 500 MG PO TABS: 500.0000 mg | ORAL_TABLET | Freq: Two times a day (BID) | ORAL | 0 refills | Status: DC | PRN

## 2023-05-16 NOTE — Discharge Instructions (Signed)
 I have prescribed you a muscle relaxer to take as needed for your muscle strain.  Apply heat as well as we discussed.  Follow-up if pain persists or worsens.  Please be advised that muscle relaxer can make you sleepy so do not drive or drink alcohol while taking it.

## 2023-05-16 NOTE — ED Provider Notes (Addendum)
 [facility]    CSN: [id number] Arrival date & time: 05/16/23  0809      History   Chief Complaint Chief Complaint  Patient presents with   Back Pain    HPI James Robinson is a 63 y.o. male.   Patient presents with approximately 5-day history of right upper back pain around his shoulder blade.  Denies any obvious injury to the area but reports that he does a lot of repetitive movements, pushing, pulling, twisting while at work so is contributing pain to this.  Any movement exacerbates pain.  Patient reports that it feels like "spasms" in his back.  He has taken Aleve intermittently with no improvement.   Back Pain   Past Medical History:  Diagnosis Date   Anxiety    Arthritis    Depression    Diabetes mellitus without complication (HCC)    Hyperlipidemia    Hypertension Dx 2008   on BP medicine for a brief period     Patient Active Problem List   Diagnosis Date Noted   Osteoarthritis of left hip 06/26/2021   OA (osteoarthritis) of knee 12/02/2020   Primary osteoarthritis of right knee 12/02/2020   Enlarged prostate on rectal examination 01/01/2014   Current smoker 01/01/2014   Rectal bleeding 01/01/2014   SHOULDER PAIN, BILATERAL 04/13/2008   KNEE PAIN, RIGHT 04/13/2008   Internal hemorrhoids 08/04/2007   Migraine headache 01/19/2007   H/O: HTN (hypertension) 01/19/2007    Past Surgical History:  Procedure Laterality Date   NO PAST SURGERIES     TOTAL HIP ARTHROPLASTY Left 06/26/2021   Procedure: TOTAL HIP ARTHROPLASTY ANTERIOR APPROACH;  Surgeon: Samson Frederic, MD;  Location: [facility];  Service: Orthopedics;  Laterality: Left;   TOTAL KNEE ARTHROPLASTY Right 12/02/2020   Procedure: TOTAL KNEE ARTHROPLASTY;  Surgeon: Ollen Gross, MD;  Location: [facility];  Service: Orthopedics;  Laterality: Right;       Home Medications    Prior to Admission medications   Medication Sig Start Date End Date Taking? Authorizing Provider  albuterol (VENTOLIN  HFA) 108 (90 Base) MCG/ACT inhaler Inhale 2 puffs into the lungs every 6 (six) hours as needed for wheezing or shortness of breath. 01/26/23  Yes Grayce Sessions, NP  amLODipine (NORVASC) 10 MG tablet Take 1 tablet (10 mg total) by mouth daily. 01/26/23  Yes Grayce Sessions, NP  metFORMIN (GLUCOPHAGE XR) 500 MG 24 hr tablet Take 1 tablet twice a day 01/26/23  Yes Edwards, Kinnie Scales, NP  methocarbamol (ROBAXIN) 500 MG tablet Take 1 tablet (500 mg total) by mouth 2 (two) times daily as needed for muscle spasms. 05/16/23  Yes Gustavus Bryant, FNP  blood glucose meter kit and supplies Dispense based on patient and insurance preference. Use up to four times daily as directed. (FOR ICD-10 E10.9, E11.9). 01/29/21   Grayce Sessions, NP  cetirizine (ZYRTEC ALLERGY) 10 MG tablet Take 1 tablet (10 mg total) by mouth daily. Patient not taking: Reported on 05/16/2023 01/26/23   Grayce Sessions, NP  fluticasone [facility]) 50 MCG/ACT nasal spray Place 2 sprays into both nostrils daily. Patient not taking: Reported on 05/16/2023 01/26/23   Grayce Sessions, NP  lisinopril (ZESTRIL) 20 MG tablet Take 1 tablet (20 mg total) by mouth daily. Patient not taking: Reported on 05/16/2023 01/26/23   Grayce Sessions, NP  predniSONE (STERAPRED UNI-PAK 21 TAB) 10 MG (21) TBPK tablet Prednisone dose pack directions Day 1 take 6 tablets Day 2 take  5 tablets Day 3 take 4 tablets Day 4 take 3 tablets Day 3 take 2 tablets Day 4 take 1 tablet Dispense 21 tablets Patient not taking: Reported on 05/16/2023 01/26/23   Grayce Sessions, NP    Family History Family History  Problem Relation Age of Onset   Glaucoma Mother    Diabetes Neg Hx    Heart disease Neg Hx    Cancer Neg Hx    Colon polyps Neg Hx    Colon cancer Neg Hx    Esophageal cancer Neg Hx    Ulcerative colitis Neg Hx    Stomach cancer Neg Hx     Social History Social History   Tobacco Use   Smoking status: Former    Current packs/day: 0.00     Average packs/day: 0.5 packs/day for 27.0 years (13.5 ttl pk-yrs)    Types: Cigarettes    Start date: 07/22/1988    Quit date: 07/23/2015    Years since quitting: 7.8   Smokeless tobacco: Never  Vaping Use   Vaping status: Never Used  Substance Use Topics   Alcohol use: Yes    Comment: occasional   Drug use: Yes    Types: Marijuana    Comment: uses on 2 x per week     Allergies   Patient has no known allergies.   Review of Systems Review of Systems Per HPI  Physical Exam Triage Vital Signs ED Triage Vitals  Encounter Vitals Group     BP 05/16/23 1018 (!) 142/76     Systolic BP Percentile --      Diastolic BP Percentile --      Pulse Rate 05/16/23 1018 80     Resp 05/16/23 1018 18     Temp 05/16/23 1018 97.7 F (36.5 C)     Temp Source 05/16/23 1018 Oral     SpO2 05/16/23 1018 98 %     Weight --      Height --      Head Circumference --      Peak Flow --      Pain Score 05/16/23 1014 10     Pain Loc --      Pain Education --      Exclude from Growth Chart --    No data found.  Updated Vital Signs BP (!) 142/76 (BP Location: Left Arm)   Pulse 80   Temp 97.7 F (36.5 C) (Oral)   Resp 18   SpO2 98%   Visual Acuity Right Eye Distance:   Left Eye Distance:   Bilateral Distance:    Right Eye Near:   Left Eye Near:    Bilateral Near:     Physical Exam Constitutional:      General: He is not in acute distress.    Appearance: Normal appearance. He is not toxic-appearing or diaphoretic.  HENT:     Head: Normocephalic and atraumatic.  Eyes:     Extraocular Movements: Extraocular movements intact.     Conjunctiva/sclera: Conjunctivae normal.  Pulmonary:     Effort: Pulmonary effort is normal.  Musculoskeletal:       Back:     Comments: Patient reports tenderness to palpation throughout right thoracic back.  No swelling or discoloration noted.  No rashes noted.  No direct spinal tenderness, crepitus, step-off noted.  Range of motion of upper extremity  exacerbates pain but patient does have full range of motion.  Grip strength is 5/5 and patient appears to be neurovascularly intact.  Neurological:     General: No focal deficit present.     Mental Status: He is alert and oriented to person, place, and time. Mental status is at baseline.  Psychiatric:        Mood and Affect: Mood normal.        Behavior: Behavior normal.        Thought Content: Thought content normal.        Judgment: Judgment normal.      UC Treatments / Results  Labs (all labs ordered are listed, but only abnormal results are displayed) Labs Reviewed - No data to display  EKG   Radiology No results found.  Procedures Procedures (including critical care time)  Medications Ordered in UC Medications - No data to display  Initial Impression / Assessment and Plan / UC Course  I have reviewed the triage vital signs and the nursing notes.  Pertinent labs & imaging results that were available during my care of the patient were reviewed by me and considered in my medical decision making (see chart for details).     Suspect muscle strain of right thoracic back.  Will treat with muscle relaxer.  Advised patient muscle relaxer can make him drowsy and do not drive or drink alcohol with taking it. Given no injury or direct spinal tenderness, imaging was deferred. Advised heat application may be helpful as well.  Patient advised to follow-up if pain persists or worsens.  Patient verbalized understanding and was agreeable with plan. Final Clinical Impressions(s) / UC Diagnoses   Final diagnoses:  Acute right-sided thoracic back pain  Muscle spasm     Discharge Instructions      I have prescribed you a muscle relaxer to take as needed for your muscle strain.  Apply heat as well as we discussed.  Follow-up if pain persists or worsens.  Please be advised that muscle relaxer can make you sleepy so do not drive or drink alcohol while taking it.    ED Prescriptions      Medication Sig Dispense Auth. Provider   methocarbamol (ROBAXIN) 500 MG tablet Take 1 tablet (500 mg total) by mouth 2 (two) times daily as needed for muscle spasms. 20 tablet [place], Acie Fredrickson, [place]      PDMP not reviewed this encounter.   Gustavus Bryant, [place] 05/16/23 1114    Gustavus Bryant, [place] 05/16/23 1209

## 2023-05-16 NOTE — ED Triage Notes (Signed)
 Pt reports since Wednesday he has pain behind his right shoulder. Believes it may have been due to lifting glass racks at work. States he has pain when he breathes, coughs, sneezes and with certain movements. Denies specific injury. States he feels "spasms" in his back. No meds today

## 2023-06-09 ENCOUNTER — Telehealth (INDEPENDENT_AMBULATORY_CARE_PROVIDER_SITE_OTHER): Payer: Self-pay | Admitting: Primary Care

## 2023-06-09 NOTE — Telephone Encounter (Signed)
 Called to remind pt about appt. Pt did not answer and number was unavailable.

## 2023-06-16 ENCOUNTER — Telehealth (INDEPENDENT_AMBULATORY_CARE_PROVIDER_SITE_OTHER): Payer: Self-pay | Admitting: Primary Care

## 2023-06-16 NOTE — Telephone Encounter (Signed)
 Called pt to confirm appt. Pt will be present.

## 2023-06-17 ENCOUNTER — Ambulatory Visit (INDEPENDENT_AMBULATORY_CARE_PROVIDER_SITE_OTHER): Payer: Medicaid Other | Admitting: Primary Care

## 2023-06-17 ENCOUNTER — Encounter (INDEPENDENT_AMBULATORY_CARE_PROVIDER_SITE_OTHER): Payer: Self-pay | Admitting: Primary Care

## 2023-06-17 VITALS — BP 128/75 | HR 77 | Resp 16 | Ht 71.0 in | Wt 225.4 lb

## 2023-06-17 DIAGNOSIS — E119 Type 2 diabetes mellitus without complications: Secondary | ICD-10-CM

## 2023-06-17 DIAGNOSIS — Z6831 Body mass index (BMI) 31.0-31.9, adult: Secondary | ICD-10-CM

## 2023-06-17 DIAGNOSIS — E782 Mixed hyperlipidemia: Secondary | ICD-10-CM | POA: Diagnosis not present

## 2023-06-17 DIAGNOSIS — Z760 Encounter for issue of repeat prescription: Secondary | ICD-10-CM

## 2023-06-17 DIAGNOSIS — E66811 Obesity, class 1: Secondary | ICD-10-CM

## 2023-06-17 DIAGNOSIS — Z7984 Long term (current) use of oral hypoglycemic drugs: Secondary | ICD-10-CM

## 2023-06-17 DIAGNOSIS — E6609 Other obesity due to excess calories: Secondary | ICD-10-CM

## 2023-06-17 DIAGNOSIS — R351 Nocturia: Secondary | ICD-10-CM

## 2023-06-17 DIAGNOSIS — I10 Essential (primary) hypertension: Secondary | ICD-10-CM | POA: Diagnosis not present

## 2023-06-17 LAB — POCT GLYCOSYLATED HEMOGLOBIN (HGB A1C): HbA1c, POC (controlled diabetic range): 7.4 % — AB (ref 0.0–7.0)

## 2023-06-17 MED ORDER — METFORMIN HCL ER 500 MG PO TB24: ORAL_TABLET | ORAL | 1 refills | Status: AC

## 2023-06-17 MED ORDER — ALBUTEROL SULFATE HFA 108 (90 BASE) MCG/ACT IN AERS: 2.0000 | INHALATION_SPRAY | Freq: Four times a day (QID) | RESPIRATORY_TRACT | 2 refills | Status: AC | PRN

## 2023-06-17 MED ORDER — AMLODIPINE BESYLATE 10 MG PO TABS: 10.0000 mg | ORAL_TABLET | Freq: Every day | ORAL | 1 refills | Status: AC

## 2023-06-17 NOTE — Progress Notes (Signed)
 [facility]  James Robinson, is a 63 y.o. male  FAO:[id number]  ONG:[id number]  DOB - 1960/08/31  Chief Complaint  Patient presents with   Diabetes   Hypertension   Hyperlipidemia       Subjective:   James Robinson is a 63 y.o. male here today for a follow up visit.  Comorbidities hypertension -BP controlled patient has No headache, No chest pain, No abdominal pain - No Nausea, No new weakness tingling or numbness, No Cough - shortness of breath.  Type 2 diabetes- Denies polyuria, polydipsia, polyphasia or vision changes.  Does not check blood sugars at home.  Improved.  Cardiovascular disease hyperlipidemia patient noted that he has change dietary habits even eaten flax seeds, smoothies bake foods-chicken, salmon, Brussels sprouts and exercising daily.   No problems updated.  Comprehensive ROS Pertinent positive and negative noted in HPI   No Known Allergies  Past Medical History:  Diagnosis Date   Anxiety    Arthritis    Depression    Diabetes mellitus without complication (HCC)    Hyperlipidemia    Hypertension Dx 2008   on BP medicine for a brief period     Current Outpatient Medications on File Prior to Visit  Medication Sig Dispense Refill   albuterol  (VENTOLIN  HFA) 108 (90 Base) MCG/ACT inhaler Inhale 2 puffs into the lungs every 6 (six) hours as needed for wheezing or shortness of breath. 18 g 2   amLODipine  (NORVASC ) 10 MG tablet Take 1 tablet (10 mg total) by mouth daily. 90 tablet 1   blood glucose meter kit and supplies Dispense based on patient and insurance preference. Use up to four times daily as directed. (FOR ICD-10 E10.9, E11.9). 1 each 0   metFORMIN  (GLUCOPHAGE  XR) 500 MG 24 hr tablet Take 1 tablet twice a day 120 tablet 1   methocarbamol  (ROBAXIN ) 500 MG tablet Take 1 tablet (500 mg total) by mouth 2 (two) times daily as needed for muscle spasms. 20 tablet 0   No current facility-administered medications on file prior to visit.   Health  Maintenance  Topic Date Due   Zoster (Shingles) Vaccine (1 of 2) Never done   Cologuard (Stool DNA test)  05/31/2020   COVID-19 Vaccine (1 - 2024-25 season) Never done   Yearly kidney health urinalysis for diabetes  05/20/2023   Flu Shot  09/10/2023   Yearly kidney function blood test for diabetes  03/17/2024   DTaP/Tdap/Td vaccine (3 - Td or Tdap) 09/10/2029   Pneumococcal Vaccination  Completed   Hepatitis C Screening  Completed   HIV Screening  Completed   HPV Vaccine  Aged Out   Meningitis B Vaccine  Aged Out    Objective:   Vitals:   06/17/23 1110  BP: 128/75  Pulse: 77  Resp: 16  SpO2: 99%  Weight: 225 lb 6.4 oz (102.2 kg)  Height: 5\' 11"  (1.803 m)   BP Readings from Last 3 Encounters:  06/17/23 128/75  05/16/23 (!) 142/76  03/18/23 113/72      Physical Exam Vitals reviewed.  Constitutional:      Appearance: He is obese.  HENT:     Head: Normocephalic.     Right Ear: Tympanic membrane and external ear normal.     Left Ear: Tympanic membrane and external ear normal.     Nose: Nose normal.  Eyes:     Extraocular Movements: Extraocular movements intact.     Pupils: Pupils are equal, round, and reactive to light.  Cardiovascular:     Rate and Rhythm: Normal rate and regular rhythm.  Pulmonary:     Effort: Pulmonary effort is normal.     Breath sounds: Normal breath sounds.  Abdominal:     General: Bowel sounds are normal. There is distension.     Palpations: Abdomen is soft.  Musculoskeletal:        General: Normal range of motion.  Skin:    General: Skin is warm and dry.  Neurological:     Mental Status: He is oriented to person, place, and time.  Psychiatric:        Mood and Affect: Mood normal.        Behavior: Behavior normal.        Thought Content: Thought content normal.        Judgment: Judgment normal.    Lab Results  Component Value Date   HGBA1C 7.4 (A) 06/17/2023   HGBA1C 9.3 (H) 03/18/2023   HGBA1C 6.4 05/20/2022    Assessment &  Plan  James Robinson was seen today for diabetes, hypertension and hyperlipidemia.  Diagnoses and all orders for this visit:  Essential hypertension Well-controlled on amlodipine -  stopped lisinopril  due to causing a head ache / light headed [id number]+EGFR  Type 2 diabetes mellitus without complication, without long-term current use of insulin  (HCC)  -     POCT glycosylated hemoglobin (Hb A1C) 7.4 improved from 9.3 very pleased with his commitment to his health improvement Continue to monitor carbohydrates -rice, potatoes, tortillas, [place] [employer] Masa Flour, breads, pasta, sweets, sodas.  Increase exercising to help maintain appropriate weight.  -     CBC with Differential/Platelet -     Microalbumin / creatinine urine ratio  Mixed hyperlipidemia  Healthy lifestyle diet of fruits vegetables fish nuts whole grains and low saturated fat . Foods high in cholesterol or liver, fatty meats,cheese, butter avocados, nuts and seeds, chocolate and fried foods. -     Lipid panel  Class 1 obesity due to excess calories without serious comorbidity with body mass index (BMI) of 31.0 to 31.9 in adult He has lost 1 pound in the last 3 months but he is working on diet exercising and expecting to continue to lose weight  Nocturia -     PSA    Patient have been counseled extensively about nutrition and exercise. Other issues discussed during this visit include: low cholesterol diet, weight control and daily exercise, foot care, annual eye examinations at Ophthalmology, importance of adherence with medications and regular follow-up. We also discussed long term complications of uncontrolled diabetes and hypertension.   Return in about 3 months (around 09/17/2023).  The patient was given clear instructions to go to ER or return to medical center if symptoms don't improve, worsen or new problems develop. The patient verbalized understanding. The patient was told to call to get lab results if they haven't heard anything in the  next week.   This note has been created with Education officer, environmental. Any transcriptional errors are unintentional.   Marius Siemens, NP 06/17/2023, 11:36 AM

## 2023-06-17 NOTE — Patient Instructions (Signed)
 Obesity, Adult Obesity is having too much body fat. Being obese means that your weight is more than what is healthy for you.  BMI (body mass index) is a number that explains how much body fat you have. If you have a BMI of 30 or more, you are obese. Obesity can cause serious health problems, such as: Stroke. Coronary artery disease (CAD). Type 2 diabetes. Some types of cancer. High blood pressure (hypertension). High cholesterol. Gallbladder stones. Obesity can also contribute to: Osteoarthritis. Sleep apnea. Infertility problems. What are the causes? Eating meals each day that are high in calories, sugar, and fat. Drinking a lot of drinks that have sugar in them. Being born with genes that may make you more likely to become obese. Having a medical condition that causes obesity. Taking certain medicines. Sitting a lot (having a sedentary lifestyle). Not getting enough sleep. What increases the risk? Having a family history of obesity. Living in an area with limited access to: [place], recreation centers, or sidewalks. Healthy food choices, such as grocery stores and farmers' markets. What are the signs or symptoms? The main sign is having too much body fat. How is this treated? Treatment for this condition often includes changing your lifestyle. Treatment may include: Changing your diet. This may include making a healthy meal plan. Exercise. This may include activity that causes your heart to beat faster (aerobic exercise) and strength training. Work with your doctor to design a program that works for you. Medicine to help you lose weight. This may be used if you are not able to lose one pound a week after 6 weeks of healthy eating and more exercise. Treating conditions that cause the obesity. Surgery. Options may include gastric banding and gastric bypass. This may be done if: Other treatments have not helped to improve your condition. You have a BMI of 40 or higher. You have  life-threatening health problems related to obesity. Follow these instructions at home: Eating and drinking  Follow advice from your doctor about what to eat and drink. Your doctor may tell you to: Limit fast food, sweets, and processed snack foods. Choose low-fat options. For example, choose low-fat milk instead of whole milk. Eat five or more servings of fruits or vegetables each day. Eat at home more often. This gives you more control over what you eat. Choose healthy foods when you eat out. Learn to read food labels. This will help you learn how much food is in one serving. Keep low-fat snacks available. Avoid drinks that have a lot of sugar in them. These include soda, fruit juice, iced tea with sugar, and flavored milk. Drink enough water to keep your pee (urine) pale yellow. Do not go on fad diets. Physical activity Exercise often, as told by your doctor. Most adults should get up to 150 minutes of moderate-intensity exercise every week.Ask your doctor: What types of exercise are safe for you. How often you should exercise. Warm up and stretch before being active. Do slow stretching after being active (cool down). Rest between times of being active. Lifestyle Work with your doctor and a food expert (dietitian) to set a weight-loss goal that is best for you. Limit your screen time. Find ways to reward yourself that do not involve food. Do not drink alcohol if: Your doctor tells you not to drink. You are pregnant, may be pregnant, or are planning to become pregnant. If you drink alcohol: Limit how much you have to: 0-1 drink a day for women. 0-2 drinks  a day for men. Know how much alcohol is in your drink. In the [place] one drink equals one 12 oz bottle of beer (355 mL), one 5 oz glass of wine (148 mL), or one 1 oz glass of hard liquor (44 mL). General instructions Keep a weight-loss journal. This can help you keep track of: The food that you eat. How much exercise you  get. Take over-the-counter and prescription medicines only as told by your doctor. Take vitamins and supplements only as told by your doctor. Think about joining a support group. Pay attention to your mental health as obesity can lead to depression or self esteem issues. Keep all follow-up visits. Contact a doctor if: You cannot meet your weight-loss goal after you have changed your diet and lifestyle for 6 weeks. You are having trouble breathing. Summary Obesity is having too much body fat. Being obese means that your weight is more than what is healthy for you. Work with your doctor to set a weight-loss goal. Get regular exercise as told by your doctor. This information is not intended to replace advice given to you by your health care provider. Make sure you discuss any questions you have with your health care provider. Document Revised: 09/03/2020 Document Reviewed: 09/03/2020 Elsevier Patient Education  2024 [employer].

## 2023-06-18 LAB — CMP14+EGFR
ALT: 27 IU/L (ref 0–44)
AST: 16 IU/L (ref 0–40)
Albumin: 4.1 g/dL (ref 3.9–4.9)
Alkaline Phosphatase: 121 IU/L (ref 44–121)
BUN/Creatinine Ratio: 11 (ref 10–24)
BUN: 8 mg/dL (ref 8–27)
Bilirubin Total: 0.3 mg/dL (ref 0.0–1.2)
CO2: 23 mmol/L (ref 20–29)
Calcium: 9.3 mg/dL (ref 8.6–10.2)
Chloride: 102 mmol/L (ref 96–106)
Creatinine, Ser: 0.73 mg/dL — ABNORMAL LOW (ref 0.76–1.27)
Globulin, Total: 3.4 g/dL (ref 1.5–4.5)
Glucose: 224 mg/dL — ABNORMAL HIGH (ref 70–99)
Potassium: 4.4 mmol/L (ref 3.5–5.2)
Sodium: 139 mmol/L (ref 134–144)
Total Protein: 7.5 g/dL (ref 6.0–8.5)
eGFR: 103 mL/min/{1.73_m2} (ref >59)

## 2023-06-18 LAB — CBC WITH DIFFERENTIAL/PLATELET
Basophils Absolute: 0 10*3/uL (ref 0.0–0.2)
Basos: 1 %
EOS (ABSOLUTE): 0.2 10*3/uL (ref 0.0–0.4)
Eos: 3 %
Hematocrit: 38.2 % (ref 37.5–51.0)
Hemoglobin: 13.3 g/dL (ref 13.0–17.7)
Immature Grans (Abs): 0 10*3/uL (ref 0.0–0.1)
Immature Granulocytes: 0 %
Lymphocytes Absolute: 1.7 10*3/uL (ref 0.7–3.1)
Lymphs: 33 %
MCH: 33.2 pg — ABNORMAL HIGH (ref 26.6–33.0)
MCHC: 34.8 g/dL (ref 31.5–35.7)
MCV: 95 fL (ref 79–97)
Monocytes Absolute: 0.5 10*3/uL (ref 0.1–0.9)
Monocytes: 9 %
Neutrophils Absolute: 2.7 10*3/uL (ref 1.4–7.0)
Neutrophils: 54 %
Platelets: 256 10*3/uL (ref 150–450)
RBC: 4.01 x10E6/uL — ABNORMAL LOW (ref 4.14–5.80)
RDW: 12.2 % (ref 11.6–15.4)
WBC: 5.1 10*3/uL (ref 3.4–10.8)

## 2023-06-18 LAB — LIPID PANEL
Chol/HDL Ratio: 3.1 ratio (ref 0.0–5.0)
Cholesterol, Total: 148 mg/dL (ref 100–199)
HDL: 48 mg/dL (ref >39)
LDL Chol Calc (NIH): 87 mg/dL (ref 0–99)
Triglycerides: 67 mg/dL (ref 0–149)
VLDL Cholesterol Cal: 13 mg/dL (ref 5–40)

## 2023-06-18 LAB — MICROALBUMIN / CREATININE URINE RATIO
Creatinine, Urine: 171.6 mg/dL
Microalb/Creat Ratio: 189 mg/g{creat} — ABNORMAL HIGH (ref 0–29)
Microalbumin, Urine: 323.9 ug/mL

## 2023-06-18 LAB — PSA: Prostate Specific Ag, Serum: 8.8 ng/mL — ABNORMAL HIGH (ref 0.0–4.0)

## 2023-06-20 ENCOUNTER — Other Ambulatory Visit (INDEPENDENT_AMBULATORY_CARE_PROVIDER_SITE_OTHER): Payer: Self-pay | Admitting: Primary Care

## 2023-06-20 DIAGNOSIS — R972 Elevated prostate specific antigen [PSA]: Secondary | ICD-10-CM

## 2023-06-25 ENCOUNTER — Ambulatory Visit (INDEPENDENT_AMBULATORY_CARE_PROVIDER_SITE_OTHER): Payer: Self-pay

## 2023-08-24 ENCOUNTER — Ambulatory Visit
Admission: EM | Admit: 2023-08-24 | Discharge: 2023-08-24 | Disposition: A | Discharge status: Home / Self Care | Attending: Family Medicine | Admitting: Family Medicine

## 2023-08-24 DIAGNOSIS — R109 Unspecified abdominal pain: Secondary | ICD-10-CM | POA: Diagnosis present

## 2023-08-24 DIAGNOSIS — M5442 Lumbago with sciatica, left side: Secondary | ICD-10-CM | POA: Insufficient documentation

## 2023-08-24 LAB — POCT URINALYSIS DIP (MANUAL ENTRY)
Bilirubin, UA: NEGATIVE
Glucose, UA: 500 mg/dL — AB
Ketones, POC UA: NEGATIVE mg/dL
Leukocytes, UA: NEGATIVE
Nitrite, UA: NEGATIVE
Protein Ur, POC: >=300 mg/dL — AB
Spec Grav, UA: >=1.03 — AB (ref 1.010–1.025)
Urobilinogen, UA: 1 U/dL
pH, UA: 6 (ref 5.0–8.0)

## 2023-08-24 MED ORDER — BACLOFEN 10 MG PO TABS: 10.0000 mg | ORAL_TABLET | Freq: Three times a day (TID) | ORAL | 0 refills | Status: AC | PRN

## 2023-08-24 MED ORDER — TAMSULOSIN HCL 0.4 MG PO CAPS: 0.4000 mg | ORAL_CAPSULE | Freq: Every day | ORAL | 0 refills | Status: AC

## 2023-08-24 MED ORDER — CIPROFLOXACIN HCL 500 MG PO TABS: 500.0000 mg | ORAL_TABLET | Freq: Two times a day (BID) | ORAL | 0 refills | Status: AC

## 2023-08-24 MED ORDER — KETOROLAC TROMETHAMINE 10 MG PO TABS: 10.0000 mg | ORAL_TABLET | Freq: Four times a day (QID) | ORAL | 0 refills | Status: AC | PRN

## 2023-08-24 MED ORDER — KETOROLAC TROMETHAMINE 30 MG/ML IJ SOLN
30.0000 mg | Freq: Once | INTRAMUSCULAR | Status: AC
  Administered 2023-08-24: 30 mg via INTRAMUSCULAR

## 2023-08-24 NOTE — Discharge Instructions (Signed)
 Urinalysis shows a small amount of blood and is concentrated.  This could be consistent with a urinary tract infection and/or a kidney stone.  You have been given a shot of Toradol  30 mg today. Ketorolac  10 mg tablets--take 1 tablet every 6 hours as needed for pain.  This is the same medicine that is in the shot we just gave you  Take baclofen  10 mg--1 tablet every 8 hours as needed for muscle spasm or muscle pain.  This medication could cause drowsiness or dizziness   Tamsulosin  0.4 mg--1 daily.  This is to help urinary flow  Take Cipro  500 mg--1 tablet 2 times daily for 7 days; this is for possible prostate or bladder infection.  Please follow-up with your primary care about this issue

## 2023-08-24 NOTE — ED Triage Notes (Signed)
 I am having left lower back pain, I have never had anything like it, it started Sunday but progressively worse, I did not sleep last night for this. My best friend who is a home care nurse says it may be a kidney stone. I do have a smell to my urine and urinary hesitancy. The pain does radiate down my buttocks to my leg some.

## 2023-08-24 NOTE — ED Provider Notes (Signed)
 [facility]    CSN: [id number] Arrival date & time: 08/24/23  1406      History   Chief Complaint Chief Complaint  Patient presents with   Back Pain    HPI James Robinson is a 63 y.o. male.    Back Pain  Here for left low back pain.  On July 12 he started having flulike symptoms with a lot of malaise and fever.  By yesterday morning, July 14 he no longer had the symptoms, but he began having left low back pain.  It worsened through the day and then he felt spasms that caused some nausea and vomiting.  The pain would radiate into his left hip and then down his left leg.  Positions and movement definitely make it worse.  No blood in the emesis  No blood noted in his urine but it has been dark.  NKDA   Past surgical history includes right knee replacement and left hip replacement.  Past Medical History:  Diagnosis Date   Anxiety    Arthritis    Depression    Diabetes mellitus without complication (HCC)    Hyperlipidemia    Hypertension Dx 2008   on BP medicine for a brief period     Patient Active Problem List   Diagnosis Date Noted   Osteoarthritis of left hip 06/26/2021   Pain of right hip joint 05/01/2021   OA (osteoarthritis) of knee 12/02/2020   Primary osteoarthritis of right knee 12/02/2020   Right knee pain 09/18/2020   Enlarged prostate on rectal examination 01/01/2014   Current smoker 01/01/2014   Rectal bleeding 01/01/2014   SHOULDER PAIN, BILATERAL 04/13/2008   KNEE PAIN, RIGHT 04/13/2008   Internal hemorrhoids 08/04/2007   Migraine headache 01/19/2007   H/O: HTN (hypertension) 01/19/2007    Past Surgical History:  Procedure Laterality Date   TOTAL HIP ARTHROPLASTY Left 06/26/2021   Procedure: TOTAL HIP ARTHROPLASTY ANTERIOR APPROACH;  Surgeon: Fidel Rogue, MD;  Location: [facility];  Service: Orthopedics;  Laterality: Left;   TOTAL KNEE ARTHROPLASTY Right 12/02/2020   Procedure: TOTAL KNEE ARTHROPLASTY;  Surgeon: Melodi Lerner, MD;  Location: [facility];  Service: Orthopedics;  Laterality: Right;       Home Medications    Prior to Admission medications   Medication Sig Start Date End Date Taking? Authorizing Provider  acetaminophen  (TYLENOL ) 650 MG CR tablet Take 650 mg by mouth every 8 (eight) hours as needed for pain.   Yes [provider]  amLODipine  (NORVASC ) 10 MG tablet Take 1 tablet (10 mg total) by mouth daily. 06/17/23  Yes Celestia Rosaline SQUIBB, NP  baclofen  (LIORESAL ) 10 MG tablet Take 1 tablet (10 mg total) by mouth every 8 (eight) hours as needed for muscle spasms. 08/24/23  Yes Vonna Sharlet POUR, MD  ciprofloxacin  (CIPRO ) 500 MG tablet Take 1 tablet (500 mg total) by mouth 2 (two) times daily for 7 days. 08/24/23 08/31/23 Yes Vonna Sharlet POUR, MD  hydrochlorothiazide  (HYDRODIURIL ) 25 MG tablet Take 1 tablet by mouth daily.   Yes [provider]  HYDROcodone -acetaminophen  (NORCO) 10-325 MG tablet Take 1-2 tablets by mouth every 4 (four) hours as needed. 08/26/21  Yes [provider]  ketorolac  (TORADOL ) 10 MG tablet Take 1 tablet (10 mg total) by mouth every 6 (six) hours as needed (pain). 08/24/23  Yes Vonna Sharlet POUR, MD  tamsulosin  (FLOMAX ) 0.4 MG CAPS capsule Take 1 capsule (0.4 mg total) by mouth daily. 08/24/23  Yes Abdurrahman Petersheim K,  MD  Accu-Chek Softclix Lancets lancets USE 1 TO CHECK GLUCOSE 4 TIMES DAILY    [provider]  albuterol  (VENTOLIN  HFA) 108 (90 Base) MCG/ACT inhaler Inhale 2 puffs into the lungs every 6 (six) hours as needed for wheezing or shortness of breath. 06/17/23   Celestia Rosaline SQUIBB, NP  blood glucose meter kit and supplies Dispense based on patient and insurance preference. Use up to four times daily as directed. (FOR ICD-10 E10.9, E11.9). 01/29/21   Celestia Rosaline SQUIBB, NP  glucose blood test strip USE 1 TO CHECK GLUCOSE 4 TIMES DAILY    [provider]  lisinopril  (ZESTRIL ) 20 MG tablet Take 1 tablet by mouth daily.    [provider]  metFORMIN  (GLUCOPHAGE  XR) 500 MG 24 hr tablet Take 1 tablet twice a day 06/17/23   Celestia Rosaline SQUIBB, NP  ondansetron  (ZOFRAN -ODT) 4 MG disintegrating tablet Take 4 mg by mouth every 8 (eight) hours as needed.    [provider]  oxyCODONE  (OXY IR/ROXICODONE ) 5 MG immediate release tablet Take 5 mg by mouth as needed.    [provider]  polyethylene glycol-electrolytes (NULYTELY ) 420 g solution Take 4,000 mLs by mouth See admin instructions.    [provider]  traMADol  (ULTRAM ) 50 MG tablet Take 50 mg by mouth every 8 (eight) hours.    [provider]    Family History Family History  Problem Relation Age of Onset   Glaucoma Mother    Diabetes Neg Hx    Heart disease Neg Hx    Cancer Neg Hx    Colon polyps Neg Hx    Colon cancer Neg Hx    Esophageal cancer Neg Hx    Ulcerative colitis Neg Hx    Stomach cancer Neg Hx     Social History Social History   Tobacco Use   Smoking status: Former    Current packs/day: 0.00    Average packs/day: 0.5 packs/day for 27.0 years (13.5 ttl pk-yrs)    Types: Cigarettes    Start date: 07/22/1988    Quit date: 07/23/2015    Years since quitting: 8.0   Smokeless tobacco: Never  Vaping Use   Vaping status: Never Used  Substance Use Topics   Alcohol  use: Yes    Comment: occasional   Drug use: Yes    Types: Marijuana    Comment: uses on 2 x per week     Allergies   Patient has no known allergies.   Review of Systems Review of Systems  Musculoskeletal:  Positive for back pain.     Physical Exam Triage Vital Signs ED Triage Vitals  Encounter Vitals Group     BP 08/24/23 1417 (!) 143/75     Girls Systolic BP Percentile --      Girls Diastolic BP Percentile --      Boys Systolic BP Percentile --      Boys Diastolic BP Percentile --      Pulse Rate 08/24/23 1417 97     Resp 08/24/23 1417 18     Temp 08/24/23 1417 98.3 F (36.8 C)     Temp Source 08/24/23 1417 Oral     SpO2  08/24/23 1417 97 %     Weight 08/24/23 1414 235 lb (106.6 kg)     Height 08/24/23 1414 5' 11 (1.803 m)     Head Circumference --      Peak Flow --      Pain Score 08/24/23 1409 10  Pain Loc --      Pain Education --      Exclude from Growth Chart --    No data found.  Updated Vital Signs BP (!) 143/75 (BP Location: Left Arm)   Pulse 97   Temp 98.3 F (36.8 C) (Oral)   Resp 18   Ht 5' 11 (1.803 m)   Wt 106.6 kg   SpO2 97%   BMI 32.78 kg/m   Visual Acuity Right Eye Distance:   Left Eye Distance:   Bilateral Distance:    Right Eye Near:   Left Eye Near:    Bilateral Near:     Physical Exam Vitals reviewed.  Constitutional:      General: He is not in acute distress.    Appearance: He is not ill-appearing, toxic-appearing or diaphoretic.  HENT:     Mouth/Throat:     Mouth: Mucous membranes are moist.  Eyes:     Extraocular Movements: Extraocular movements intact.     Conjunctiva/sclera: Conjunctivae normal.     Pupils: Pupils are equal, round, and reactive to light.  Cardiovascular:     Rate and Rhythm: Normal rate and regular rhythm.     Heart sounds: No murmur heard. Pulmonary:     Effort: Pulmonary effort is normal.     Breath sounds: Normal breath sounds.  Abdominal:     Tenderness: There is no right CVA tenderness or left CVA tenderness.  Musculoskeletal:     Cervical back: Neck supple.     Comments: Straight leg raise increases pain in the left low back.  Lymphadenopathy:     Cervical: No cervical adenopathy.  Skin:    Coloration: Skin is not pale.     Findings: No rash.  Neurological:     General: No focal deficit present.     Mental Status: He is alert and oriented to person, place, and time.  Psychiatric:        Behavior: Behavior normal.      UC Treatments / Results  Labs (all labs ordered are listed, but only abnormal results are displayed) Labs Reviewed  POCT URINALYSIS DIP (MANUAL ENTRY) - Abnormal; Notable for the following  components:      Result Value   Clarity, UA cloudy (*)    Glucose, UA =500 (*)    Spec Grav, UA >=1.030 (*)    Blood, UA small (*)    Protein Ur, POC >=300 (*)    All other components within normal limits  URINE CULTURE    EKG   Radiology No results found.  Procedures Procedures (including critical care time)  Medications Ordered in UC Medications  ketorolac  (TORADOL ) 30 MG/ML injection 30 mg (has no administration in time range)    Initial Impression / Assessment and Plan / UC Course  I have reviewed the triage vital signs and the nursing notes.  Pertinent labs & imaging results that were available during my care of the patient were reviewed by me and considered in my medical decision making (see chart for details).     Urinalysis shows small amount of blood.  It also is concentrated greater than 1.03  Last EGFR in May of this year was 103.  Toradol  injection was given here and Toradol  tablets were sent in for pain along with some muscle relaxer.  Since he does have some blood in his urine I am also going to send in Flomax  and culture the urine.  Cipro  was sent in for possible prostatitis.  I  have asked him to follow-up with his primary care   Final Clinical Impressions(s) / UC Diagnoses   Final diagnoses:  Acute left-sided low back pain with left-sided sciatica  Left flank pain     Discharge Instructions      Urinalysis shows a small amount of blood and is concentrated.  This could be consistent with a urinary tract infection and/or a kidney stone.  You have been given a shot of Toradol  30 mg today. Ketorolac  10 mg tablets--take 1 tablet every 6 hours as needed for pain.  This is the same medicine that is in the shot we just gave you  Take baclofen  10 mg--1 tablet every 8 hours as needed for muscle spasm or muscle pain.  This medication could cause drowsiness or dizziness   Tamsulosin  0.4 mg--1 daily.  This is to help urinary flow  Take Cipro  500 mg--1  tablet 2 times daily for 7 days; this is for possible prostate or bladder infection.  Please follow-up with your primary care about this issue      ED Prescriptions     Medication Sig Dispense Auth. Provider   ketorolac  (TORADOL ) 10 MG tablet Take 1 tablet (10 mg total) by mouth every 6 (six) hours as needed (pain). 20 tablet Tamon Parkerson, Sharlet POUR, MD   tamsulosin  (FLOMAX ) 0.4 MG CAPS capsule Take 1 capsule (0.4 mg total) by mouth daily. 10 capsule Vonna Sharlet POUR, MD   ciprofloxacin  (CIPRO ) 500 MG tablet Take 1 tablet (500 mg total) by mouth 2 (two) times daily for 7 days. 14 tablet Jaryn Hocutt K, MD   baclofen  (LIORESAL ) 10 MG tablet Take 1 tablet (10 mg total) by mouth every 8 (eight) hours as needed for muscle spasms. 15 each Vonna Sharlet POUR, MD      PDMP not reviewed this encounter.   Vonna Sharlet POUR, MD 08/24/23 1444

## 2023-08-25 LAB — URINE CULTURE: Culture: NO GROWTH

## 2023-08-26 ENCOUNTER — Ambulatory Visit (HOSPITAL_COMMUNITY): Payer: Self-pay

## 2023-09-20 ENCOUNTER — Encounter (INDEPENDENT_AMBULATORY_CARE_PROVIDER_SITE_OTHER): Payer: Self-pay | Admitting: Primary Care

## 2023-09-20 ENCOUNTER — Ambulatory Visit (INDEPENDENT_AMBULATORY_CARE_PROVIDER_SITE_OTHER): Admitting: Primary Care

## 2023-09-20 ENCOUNTER — Telehealth: Payer: Self-pay

## 2023-09-20 VITALS — BP 138/78 | HR 56 | Wt 221.4 lb

## 2023-09-20 DIAGNOSIS — E119 Type 2 diabetes mellitus without complications: Secondary | ICD-10-CM | POA: Diagnosis not present

## 2023-09-20 DIAGNOSIS — Z596 Low income: Secondary | ICD-10-CM | POA: Diagnosis not present

## 2023-09-20 DIAGNOSIS — R319 Hematuria, unspecified: Secondary | ICD-10-CM

## 2023-09-20 DIAGNOSIS — Z7984 Long term (current) use of oral hypoglycemic drugs: Secondary | ICD-10-CM | POA: Diagnosis not present

## 2023-09-20 DIAGNOSIS — Z139 Encounter for screening, unspecified: Secondary | ICD-10-CM

## 2023-09-20 LAB — GLUCOSE, POCT (MANUAL RESULT ENTRY): POC Glucose: 179 mg/dL — AB (ref 70–99)

## 2023-09-20 LAB — POCT GLYCOSYLATED HEMOGLOBIN (HGB A1C): HbA1c, POC (controlled diabetic range): 7.3 % — AB (ref 0.0–7.0)

## 2023-09-20 NOTE — Progress Notes (Signed)
 Complex Care Management Note Care Guide Note  09/20/2023 Name: James Robinson MRN: [id number] DOB: 05/28/1960   Complex Care Management Outreach Attempts: An unsuccessful telephone outreach was attempted today to offer the patient information about available complex care management services.  Follow Up Plan:  Additional outreach attempts will be made to offer the patient complex care management information and services.   Encounter Outcome:  No Answer  Leotis Rase [facility], [facility] Guide  Direct Dial: [phone]  Fax [phone]

## 2023-09-20 NOTE — Progress Notes (Signed)
 [facility]   James Robinson is a 63 y.o. male presents for hypertension evaluation, Denies shortness of breath, headaches, chest pain or lower extremity edema, sudden onset, vision changes, unilateral weakness, dizziness, paresthesias  Pain back  Patient reports adherence with medications.  Past Medical History:  Diagnosis Date   Anxiety    Arthritis    Depression    Diabetes mellitus without complication (HCC)    Hyperlipidemia    Hypertension Dx 2008   on BP medicine for a brief period    Past Surgical History:  Procedure Laterality Date   TOTAL HIP ARTHROPLASTY Left 06/26/2021   Procedure: TOTAL HIP ARTHROPLASTY ANTERIOR APPROACH;  Surgeon: Fidel Rogue, MD;  Location: [facility];  Service: Orthopedics;  Laterality: Left;   TOTAL KNEE ARTHROPLASTY Right 12/02/2020   Procedure: TOTAL KNEE ARTHROPLASTY;  Surgeon: Melodi Lerner, MD;  Location: [facility];  Service: Orthopedics;  Laterality: Right;   No Known Allergies Current Outpatient Medications on File Prior to Visit  Medication Sig Dispense Refill   Accu-Chek Softclix Lancets lancets USE 1 TO CHECK GLUCOSE 4 TIMES DAILY     acetaminophen  (TYLENOL ) 650 MG CR tablet Take 650 mg by mouth every 8 (eight) hours as needed for pain.     albuterol  (VENTOLIN  HFA) 108 (90 Base) MCG/ACT inhaler Inhale 2 puffs into the lungs every 6 (six) hours as needed for wheezing or shortness of breath. 18 g 2   amLODipine  (NORVASC ) 10 MG tablet Take 1 tablet (10 mg total) by mouth daily. 90 tablet 1   baclofen  (LIORESAL ) 10 MG tablet Take 1 tablet (10 mg total) by mouth every 8 (eight) hours as needed for muscle spasms. 15 each 0   blood glucose meter kit and supplies Dispense based on patient and insurance preference. Use up to four times daily as directed. (FOR ICD-10 E10.9, E11.9). 1 each 0   glucose blood test strip USE 1 TO CHECK GLUCOSE 4 TIMES DAILY     hydrochlorothiazide  (HYDRODIURIL ) 25 MG tablet Take 1 tablet by mouth daily.      HYDROcodone -acetaminophen  (NORCO) 10-325 MG tablet Take 1-2 tablets by mouth every 4 (four) hours as needed.     ketorolac  (TORADOL ) 10 MG tablet Take 1 tablet (10 mg total) by mouth every 6 (six) hours as needed (pain). 20 tablet 0   lisinopril  (ZESTRIL ) 20 MG tablet Take 1 tablet by mouth daily.     metFORMIN  (GLUCOPHAGE  XR) 500 MG 24 hr tablet Take 1 tablet twice a day 120 tablet 1   ondansetron  (ZOFRAN -ODT) 4 MG disintegrating tablet Take 4 mg by mouth every 8 (eight) hours as needed.     oxyCODONE  (OXY IR/ROXICODONE ) 5 MG immediate release tablet Take 5 mg by mouth as needed.     polyethylene glycol-electrolytes (NULYTELY ) 420 g solution Take 4,000 mLs by mouth See admin instructions.     tamsulosin  (FLOMAX ) 0.4 MG CAPS capsule Take 1 capsule (0.4 mg total) by mouth daily. 10 capsule 0   traMADol  (ULTRAM ) 50 MG tablet Take 50 mg by mouth every 8 (eight) hours.     No current facility-administered medications on file prior to visit.   Social History   Socioeconomic History   Marital status: Single    Spouse name: Not on file   Number of children: 0   Years of education: 12    Highest education level: Not on file  Occupational History   Occupation: Waiter   Tobacco Use   Smoking status: Former  Current packs/day: 0.00    Average packs/day: 0.5 packs/day for 27.0 years (13.5 ttl pk-yrs)    Types: Cigarettes    Start date: 07/22/1988    Quit date: 07/23/2015    Years since quitting: 8.1   Smokeless tobacco: Never  Vaping Use   Vaping status: Never Used  Substance and Sexual Activity   Alcohol  use: Yes    Comment: occasional   Drug use: Yes    Types: Marijuana    Comment: uses on 2 x per week   Sexual activity: Not Currently    Birth control/protection: Condom  Other Topics Concern   Not on file  Social History Narrative   Lives alone.   Sister lives in town.   Father lives in [place].       Social Drivers of Health   Financial Resource Strain: Medium Risk  (09/20/2023)   Overall Financial Resource Strain (CARDIA)    Difficulty of Paying Living Expenses: Somewhat hard  Food Insecurity: Food Insecurity Present (09/20/2023)   Hunger Vital Sign    Worried About Running Out of Food in the Last Year: Not on file    Ran Out of Food in the Last Year: Sometimes true  Transportation Needs: No Transportation Needs (09/20/2023)   PRAPARE - Administrator, Civil Service (Medical): No    Lack of Transportation (Non-Medical): No  Physical Activity: Inactive (09/20/2023)   Exercise Vital Sign    Days of Exercise per Week: 0 days    Minutes of Exercise per Session: 0 min  Stress: Stress Concern Present (09/20/2023)   [employer] of Occupational Health - Occupational Stress Questionnaire    Feeling of Stress: Rather much  Social Connections: Moderately Isolated (09/20/2023)   Social Connection and Isolation Panel    Frequency of Communication with Friends and Family: Three times a week    Frequency of Social Gatherings with Friends and Family: Never    Attends Religious Services: Never    Database administrator or Organizations: Yes    Attends Banker Meetings: Never    Marital Status: Divorced  Catering manager Violence: Not on file   Family History  Problem Relation Age of Onset   Glaucoma Mother    Diabetes Neg Hx    Heart disease Neg Hx    Cancer Neg Hx    Colon polyps Neg Hx    Colon cancer Neg Hx    Esophageal cancer Neg Hx    Ulcerative colitis Neg Hx    Stomach cancer Neg Hx    Health Maintenance  Topic Date Due   Zoster Vaccines- Shingrix (1 of 2) Never done   COVID-19 Vaccine (1 - 2024-25 season) Never done   INFLUENZA VACCINE  09/10/2023   Diabetic kidney evaluation - eGFR measurement  06/16/2024   Diabetic kidney evaluation - Urine ACR  06/16/2024   DTaP/Tdap/Td (3 - Td or Tdap) 09/10/2029   Colonoscopy  03/25/2031   Pneumococcal Vaccine: 19-49 Years  Completed   Pneumococcal Vaccine: 50+ Years   Completed   Hepatitis C Screening  Completed   HIV Screening  Completed   Hepatitis B Vaccines  Aged Out   HPV VACCINES  Aged Out   Meningococcal B Vaccine  Aged Out   Fecal DNA (Cologuard)  Discontinued     OBJECTIVE:  Vitals:   09/20/23 1101  BP: (!) 156/74  Pulse: (!) 56  SpO2: 99%  Weight: 221 lb 6.4 oz (100.4 kg)    Physical Exam  Vitals reviewed.  Constitutional:      Appearance: He is obese.  HENT:     Head: Normocephalic.     Right Ear: Tympanic membrane and external ear normal.     Left Ear: Tympanic membrane and external ear normal.     Nose: Nose normal.  Eyes:     Extraocular Movements: Extraocular movements intact.     Pupils: Pupils are equal, round, and reactive to light.  Cardiovascular:     Rate and Rhythm: Normal rate and regular rhythm.  Pulmonary:     Effort: Pulmonary effort is normal.     Breath sounds: Normal breath sounds.  Abdominal:     General: Bowel sounds are normal. There is distension.     Palpations: Abdomen is soft.  Musculoskeletal:        General: Normal range of motion.  Skin:    General: Skin is warm and dry.  Neurological:     Mental Status: He is oriented to person, place, and time.  Psychiatric:        Mood and Affect: Mood normal.        Behavior: Behavior normal.        Thought Content: Thought content normal.        Judgment: Judgment normal.      ROS  Last 3 Office BP readings: BP Readings from Last 3 Encounters:  09/20/23 (!) 156/74  08/24/23 (!) 143/75  06/17/23 128/75    BMET    Component Value Date/Time   NA 139 06/17/2023 1049   K 4.4 06/17/2023 1049   CL 102 06/17/2023 1049   CO2 23 06/17/2023 1049   GLUCOSE 224 (H) 06/17/2023 1049   GLUCOSE 270 (H) 06/27/2021 0341   BUN 8 06/17/2023 1049   CREATININE 0.73 (L) 06/17/2023 1049   CREATININE 0.79 01/01/2014 1027   CALCIUM  9.3 06/17/2023 1049   GFRNONAA >60 06/27/2021 0341   GFRNONAA >89 01/01/2014 1027   GFRAA 109 10/10/2019 1442   GFRAA >89  01/01/2014 1027    Renal function: CrCl cannot be calculated (Patient's most recent lab result is older than the maximum 21 days allowed.).  Clinical ASCVD: Yes  The 10-year ASCVD risk score (Arnett DK, et al., 2019) is: 33.1%   Values used to calculate the score:     Age: 20 years     Clincally relevant sex: Male     Is Non-Hispanic African American: Yes     Diabetic: Yes     Tobacco smoker: No     Systolic Blood Pressure: 156 mmHg     Is BP treated: Yes     HDL Cholesterol: 48 mg/dL     Total Cholesterol: 148 mg/dL  ASCVD risk factors include- [place]   ASSESSMENT & PLAN: James Robinson was seen today for medical management of chronic issues.  Diagnoses and all orders for this visit:  Type 2 diabetes mellitus without complication, without long-term current use of insulin  (HCC) -     POCT glycosylated hemoglobin (Hb A1C) -     POCT glucose (manual entry)  Encounter for screening involving social determinants of health (SDoH) -     AMB Referral VBCI Care Management  Hematuria, unspecified type -     Ambulatory referral to Urology     This note has been created with Dragon speech recognition software and Paediatric nurse. Any transcriptional errors are unintentional.   James SHAUNNA Bohr, NP 09/20/2023, 11:17 AM

## 2023-09-21 ENCOUNTER — Telehealth: Payer: Self-pay | Admitting: *Deleted

## 2023-09-21 NOTE — Progress Notes (Signed)
 Complex Care Management Note Care Guide Note  09/21/2023 Name: James Robinson MRN: [id number] DOB: [birth year]   Complex Care Management Outreach Attempts: An unsuccessful telephone outreach was attempted today to offer the patient information about available complex care management services.  Follow Up Plan:  Additional outreach attempts will be made to offer the patient complex care management information and services.   Encounter Outcome:  No Answer  [facility] HealthPopulation Health Care Guide  Direct Dial:[phone] Fax:[phone] Website: [email or web address]

## 2023-09-22 ENCOUNTER — Telehealth: Payer: Self-pay | Admitting: *Deleted

## 2023-09-22 NOTE — Progress Notes (Signed)
 Complex Care Management Note Care Guide Note  09/22/2023 Name: James Robinson MRN: [id number] DOB: 09/27/1960   Complex Care Management Outreach Attempts: An unsuccessful telephone outreach was attempted today to offer the patient information about available complex care management services.  Follow Up Plan:  Additional outreach attempts will be made to offer the patient complex care management information and services.   Encounter Outcome:  No Answer  [facility] HealthPopulation Health Care Guide  Direct [phone] Fax:[phone] Website: [email or web address]

## 2023-09-28 ENCOUNTER — Telehealth: Payer: Self-pay | Admitting: *Deleted

## 2023-09-28 NOTE — Progress Notes (Signed)
 Complex Care Management Note Care Guide Note  09/28/2023 Name: TREYVIN GLIDDEN MRN: [id number] DOB: 07-16-1960  DAYRON ODLAND is a 63 y.o. year old male who is a primary care patient of Celestia Rosaline SQUIBB, NP . The community resource team was consulted for assistance with Transportation Needs , Food Insecurity, and Financial Difficulties related to food   SDOH screenings and interventions completed:  Yes     SDOH Interventions Today    Flowsheet Row Most Recent Value  SDOH Interventions   Transportation Interventions [facility] (Specialized Community Area Transporation), [employer] Provided  [Provided with transportation community]  Financial Strain Interventions [employer] Resources Provided  [Provided many resources ffor  SDOH needs]  Social Connections Interventions Community Resources Provided  [employer] mail senior and parks and recreation]     Care guide performed the following interventions: Patient provided with information about care guide support team and interviewed to confirm resource needs.  Follow Up Plan:  No further follow up planned at this time. The patient has been provided with needed resources.  Encounter Outcome:  Patient Visit Completed Ramell Wacha Greenauer-Moran  [facility] HealthPopulation Health Care Guide  Direct Dial:[phone] [phone] Website: [email or web address]

## 2023-12-27 ENCOUNTER — Ambulatory Visit (INDEPENDENT_AMBULATORY_CARE_PROVIDER_SITE_OTHER): Admitting: Primary Care

## 2024-01-26 ENCOUNTER — Ambulatory Visit

## 2024-01-26 ENCOUNTER — Encounter: Payer: Self-pay | Admitting: Emergency Medicine

## 2024-01-26 ENCOUNTER — Ambulatory Visit: Admission: EM | Admit: 2024-01-26 | Discharge: 2024-01-26 | Disposition: A | Discharge status: Home / Self Care

## 2024-01-26 DIAGNOSIS — W1849XA Other slipping, tripping and stumbling without falling, initial encounter: Secondary | ICD-10-CM

## 2024-01-26 DIAGNOSIS — S300XXA Contusion of lower back and pelvis, initial encounter: Secondary | ICD-10-CM

## 2024-01-26 MED ORDER — KETOROLAC TROMETHAMINE 30 MG/ML IJ SOLN
30.0000 mg | Freq: Once | INTRAMUSCULAR | Status: AC
  Administered 2024-01-26: 09:00:00 30 mg via INTRAMUSCULAR

## 2024-01-26 MED ORDER — DEXAMETHASONE 10 MG/ML FOR PEDIATRIC ORAL USE
10.0000 mg | Freq: Once | INTRAMUSCULAR | Status: AC
  Administered 2024-01-26: 09:00:00 10 mg via ORAL

## 2024-01-26 MED ORDER — DICLOFENAC SODIUM 75 MG PO TBEC: 75.0000 mg | DELAYED_RELEASE_TABLET | Freq: Two times a day (BID) | ORAL | 0 refills | Status: AC

## 2024-01-26 NOTE — ED Triage Notes (Signed)
 Pt reports fall that occurred on Sunday, 12/14. He was walking to his apartment when he tripped over the curb and fell back landing on his tailbone. Has taken ibuprofen  and leftover robaxin  with no relief. Using heating pad to sleep. 8/10-10/10 pain. Area is sore and tender to touch.

## 2024-01-26 NOTE — ED Provider Notes (Signed)
 [facility]    CSN: [id number] Arrival date & time: 01/26/24  0801      History   Chief Complaint Chief Complaint  Patient presents with   Fall   Back Pain    HPI James Robinson is a 63 y.o. male.   Pt presents today due to 8-10/10 tailbone pain after a trip and fall that happened outside his apartment on Sunday. Pt states that he has been using about 800 mg of ibuprofen  daily and left over Robaxin  with no significant relief. Pt denies numbness or tingling of legs, changes in bowel or bladder habits, or saddle anesthesia.   The history is provided by the patient.  Fall  Back Pain   Past Medical History:  Diagnosis Date   Anxiety    Arthritis    Depression    Diabetes mellitus without complication (HCC)    Hyperlipidemia    Hypertension Dx 2008   on BP medicine for a brief period     Patient Active Problem List   Diagnosis Date Noted   Osteoarthritis of left hip 06/26/2021   Pain of right hip joint 05/01/2021   OA (osteoarthritis) of knee 12/02/2020   Primary osteoarthritis of right knee 12/02/2020   Right knee pain 09/18/2020   Enlarged prostate on rectal examination 01/01/2014   Current smoker 01/01/2014   Rectal bleeding 01/01/2014   SHOULDER PAIN, BILATERAL 04/13/2008   KNEE PAIN, RIGHT 04/13/2008   Internal hemorrhoids 08/04/2007   Migraine headache 01/19/2007   H/O: HTN (hypertension) 01/19/2007    Past Surgical History:  Procedure Laterality Date   TOTAL HIP ARTHROPLASTY Left 06/26/2021   Procedure: TOTAL HIP ARTHROPLASTY ANTERIOR APPROACH;  Surgeon: Fidel Rogue, MD;  Location: [facility];  Service: Orthopedics;  Laterality: Left;   TOTAL KNEE ARTHROPLASTY Right 12/02/2020   Procedure: TOTAL KNEE ARTHROPLASTY;  Surgeon: Melodi Lerner, MD;  Location: [facility];  Service: Orthopedics;  Laterality: Right;       Home Medications    Prior to Admission medications  Medication Sig Start Date End Date Taking? Authorizing Provider   Accu-Chek Softclix Lancets lancets USE 1 TO CHECK GLUCOSE 4 TIMES DAILY   Yes [provider]  acetaminophen  (TYLENOL ) 650 MG CR tablet Take 650 mg by mouth every 8 (eight) hours as needed for pain.   Yes [provider]  amLODipine  (NORVASC ) 10 MG tablet Take 1 tablet (10 mg total) by mouth daily. 06/17/23  Yes Celestia Rosaline SQUIBB, NP  blood glucose meter kit and supplies Dispense based on patient and insurance preference. Use up to four times daily as directed. (FOR ICD-10 E10.9, E11.9). 01/29/21  Yes Celestia Rosaline P, NP  glucose blood test strip USE 1 TO CHECK GLUCOSE 4 TIMES DAILY   Yes [provider]  metFORMIN  (GLUCOPHAGE  XR) 500 MG 24 hr tablet Take 1 tablet twice a day 06/17/23  Yes Edwards, Rosaline SQUIBB, NP  methocarbamol  (ROBAXIN ) 500 MG tablet Take 1 tablet by mouth every 6 (six) hours as needed. 08/23/21  Yes [provider]  albuterol  (VENTOLIN  HFA) 108 (90 Base) MCG/ACT inhaler Inhale 2 puffs into the lungs every 6 (six) hours as needed for wheezing or shortness of breath. 06/17/23   Celestia Rosaline SQUIBB, NP  baclofen  (LIORESAL ) 10 MG tablet Take 1 tablet (10 mg total) by mouth every 8 (eight) hours as needed for muscle spasms. Patient not taking: Reported on 01/26/2024 08/24/23   Vonna Sharlet POUR, MD  hydrochlorothiazide  (HYDRODIURIL ) 25 MG tablet Take 1  tablet by mouth daily. Patient not taking: Reported on 01/26/2024    [provider]  HYDROcodone -acetaminophen  (NORCO) 10-325 MG tablet Take 1-2 tablets by mouth every 4 (four) hours as needed. Patient not taking: Reported on 01/26/2024 08/26/21   [provider]  ketorolac  (TORADOL ) 10 MG tablet Take 1 tablet (10 mg total) by mouth every 6 (six) hours as needed (pain). Patient not taking: Reported on 01/26/2024 08/24/23   Vonna Sharlet POUR, MD  lisinopril  (ZESTRIL ) 20 MG tablet Take 1 tablet by mouth daily. Patient not taking: Reported on 01/26/2024    [provider]   ondansetron  (ZOFRAN -ODT) 4 MG disintegrating tablet Take 4 mg by mouth every 8 (eight) hours as needed. Patient not taking: Reported on 01/26/2024    [provider]  oxyCODONE  (OXY IR/ROXICODONE ) 5 MG immediate release tablet Take 5 mg by mouth as needed. Patient not taking: Reported on 01/26/2024    [provider]  polyethylene glycol-electrolytes (NULYTELY ) 420 g solution Take 4,000 mLs by mouth See admin instructions. Patient not taking: Reported on 01/26/2024    [provider]  tamsulosin  (FLOMAX ) 0.4 MG CAPS capsule Take 1 capsule (0.4 mg total) by mouth daily. 08/24/23   Vonna Sharlet POUR, MD  traMADol  (ULTRAM ) 50 MG tablet Take 50 mg by mouth every 8 (eight) hours. Patient not taking: Reported on 01/26/2024    [provider]    Family History Family History  Problem Relation Age of Onset   Glaucoma Mother    Diabetes Neg Hx    Heart disease Neg Hx    Cancer Neg Hx    Colon polyps Neg Hx    Colon cancer Neg Hx    Esophageal cancer Neg Hx    Ulcerative colitis Neg Hx    Stomach cancer Neg Hx     Social History Social History[1]   Allergies   Patient has no known allergies.   Review of Systems Review of Systems  Musculoskeletal:  Positive for back pain.     Physical Exam Triage Vital Signs ED Triage Vitals [01/26/24 0818]  Encounter Vitals Group     BP 133/74     Girls Systolic BP Percentile      Girls Diastolic BP Percentile      Boys Systolic BP Percentile      Boys Diastolic BP Percentile      Pulse Rate 87     Resp 14     Temp (!) 97.4 F (36.3 C)     Temp Source Oral     SpO2 94 %     Weight      Height      Head Circumference      Peak Flow      Pain Score 8     Pain Loc      Pain Education      Exclude from Growth Chart    No data found.  Updated Vital Signs BP 133/74 (BP Location: Right Arm)   Pulse 87   Temp (!) 97.4 F (36.3 C) (Oral)   Resp 14   SpO2 94%   Visual Acuity Right Eye  Distance:   Left Eye Distance:   Bilateral Distance:    Right Eye Near:   Left Eye Near:    Bilateral Near:     Physical Exam Vitals and nursing note reviewed.  Constitutional:      General: He is not in acute distress.    Appearance: Normal appearance. He is not ill-appearing,  toxic-appearing or diaphoretic.  Eyes:     General: No scleral icterus. Cardiovascular:     Rate and Rhythm: Normal rate and regular rhythm.     Heart sounds: Normal heart sounds.  Pulmonary:     Effort: Pulmonary effort is normal. No respiratory distress.     Breath sounds: Normal breath sounds. No wheezing or rhonchi.  Musculoskeletal:       Back:     Comments: Tenderness to palpation of coccyx  Skin:    General: Skin is warm.  Neurological:     Mental Status: He is alert and oriented to person, place, and time.  Psychiatric:        Mood and Affect: Mood normal.        Behavior: Behavior normal.      UC Treatments / Results  Labs (all labs ordered are listed, but only abnormal results are displayed) Labs Reviewed - No data to display  EKG   Radiology No results found.  Procedures Procedures (including critical care time)  Medications Ordered in UC Medications - No data to display  Initial Impression / Assessment and Plan / UC Course  I have reviewed the triage vital signs and the nursing notes.  Pertinent labs & imaging results that were available during my care of the patient were reviewed by me and considered in my medical decision making (see chart for details).    Final Clinical Impressions(s) / UC Diagnoses   Final diagnoses:  Other slipping, tripping and stumbling without falling, initial encounter   Discharge Instructions   None    ED Prescriptions   None    PDMP not reviewed this encounter.    [1]  Social History Tobacco Use   Smoking status: Former    Current packs/day: 0.00    Average packs/day: 0.5 packs/day for 27.0 years (13.5 ttl pk-yrs)    Types:  Cigarettes    Start date: 07/22/1988    Quit date: 07/23/2015    Years since quitting: 8.5   Smokeless tobacco: Never  Vaping Use   Vaping status: Never Used  Substance Use Topics   Alcohol  use: Yes    Comment: occasional   Drug use: Yes    Types: Marijuana    Comment: uses on 2 x per week     Andra Corean BROCKS, PA-C 01/26/24 [phone]

## 2024-01-26 NOTE — Discharge Instructions (Addendum)
 No fracture seen on xray.  Today you have been diagnosed with a musculoskeletal injury.   You should use ice on affected area for 20 minutes at a time a couple times a day for the first 24 hours then you may switch to heat in the same intervals.  Be sure to put a barrier between ice or heat source and skin to prevent burns.  May also wrap affected area and Ace bandage if tolerated and appropriate, and elevate above the level of the heart to help reduce swelling.  Do not wrap Ace bandages around neck or torso as wrapping too tight can restrict air movement inability to breathe.  If symptoms do not seem to be improving in 3 to 5 days after following these instructions we need to follow-up with orthopedist or PCP.

## 2024-02-04 ENCOUNTER — Ambulatory Visit: Payer: Self-pay

## 2024-02-04 NOTE — Telephone Encounter (Signed)
 FYI Only or Action Required?: FYI only for provider: ED advised.  Patient was last seen in primary care on 09/20/2023 by Celestia Rosaline SQUIBB, NP.  Called Nurse Triage reporting Groin Swelling.  Symptoms began a week ago.  Interventions attempted: Nothing.  Symptoms are: unchanged.  Triage Disposition: Go to ED Now (Notify PCP)  Patient/caregiver understands and will follow disposition?: Yes   Copied from CRM [id number]. Topic: Clinical - Red Word Triage >> Feb 04, 2024  2:41 PM Lonell PEDLAR wrote: Red Word that prompted transfer to Nurse Triage: Testicular pain   ----------------------------------------------------------------------- From previous Reason for Contact - Scheduling: Patient/patient representative is calling to schedule an appointment. Refer to attachments for appointment information. >> Feb 04, 2024  2:44 PM Lonell PEDLAR wrote: Correction, patient experiencing prostate pain Reason for Disposition  SEVERE pain (e.g., excruciating)  Answer Assessment - Initial Assessment Questions Advised ED now. Patient verbalized understanding reports will go to [facility] ED.  1. LOCATION and RADIATION: Where is the pain located?      Scrotum pain;  2. QUALITY: What does the pain feel like?  (e.g., sharp, dull, aching, burning)     Dull, aching 3. SEVERITY: How bad is the pain?  (Scale 1-10; or mild, moderate, severe)     6/10; come s and gores 4. ONSET: When did the pain start?     week 5. PATTERN: Does it come and go, or has it been constant since it started?     Comes and goes 6. SCROTAL APPEARANCE: What does the scrotum look like? Is there any swelling or redness?      Denies swelling or redness; unsure 7. HERNIA: Has a doctor ever told you that you have a hernia?     no 8. OTHER SYMPTOMS: Do you have any other symptoms? (e.g., abdomen pain, difficulty passing urine, fever, vomiting)    Frequent urination, Denies abd pain, diff urinating, fever chills n/v,  discharge  Protocols used: Scrotum Pain-A-AH

## 2024-02-04 NOTE — Telephone Encounter (Signed)
Disposition noted.
